# Patient Record
Sex: Male | Born: 1940
Health system: Southern US, Community
[De-identification: ages and names within clinical notes are randomized; demographics above are authoritative.]

## PROBLEM LIST (undated history)

## (undated) DIAGNOSIS — D751 Secondary polycythemia: Secondary | ICD-10-CM

## (undated) DIAGNOSIS — T5891XA Toxic effect of carbon monoxide from unspecified source, accidental (unintentional), initial encounter: Secondary | ICD-10-CM

## (undated) DIAGNOSIS — D696 Thrombocytopenia, unspecified: Secondary | ICD-10-CM

## (undated) DIAGNOSIS — I10 Essential (primary) hypertension: Secondary | ICD-10-CM

## (undated) DIAGNOSIS — J449 Chronic obstructive pulmonary disease, unspecified: Secondary | ICD-10-CM

## (undated) HISTORY — PX: COLONOSCOPY: SHX174

## (undated) HISTORY — DX: Toxic effect of carbon monoxide from unspecified source, accidental (unintentional), initial encounter: T58.91XA

## (undated) HISTORY — DX: Secondary polycythemia: D75.1

## (undated) HISTORY — PX: EYE SURGERY: SHX253

## (undated) HISTORY — PX: HERNIA REPAIR: SHX51

## (undated) HISTORY — DX: Essential (primary) hypertension: I10

## (undated) HISTORY — PX: OTHER SURGICAL HISTORY: SHX169

## (undated) HISTORY — DX: Thrombocytopenia, unspecified: D69.6

## (undated) HISTORY — PX: APPENDECTOMY: SHX54

## (undated) HISTORY — PX: ESOPHAGOGASTRODUODENOSCOPY: SHX1529

---

## 2009-07-27 ENCOUNTER — Ambulatory Visit: Payer: Self-pay | Admitting: Internal Medicine

## 2009-10-04 ENCOUNTER — Ambulatory Visit: Payer: Self-pay | Admitting: Vascular Surgery

## 2010-06-09 ENCOUNTER — Ambulatory Visit: Payer: Self-pay | Admitting: Otolaryngology

## 2012-04-08 ENCOUNTER — Ambulatory Visit: Payer: Self-pay | Admitting: Family Medicine

## 2012-08-01 ENCOUNTER — Ambulatory Visit: Payer: Self-pay | Admitting: Family Medicine

## 2012-09-26 ENCOUNTER — Ambulatory Visit: Payer: Self-pay | Admitting: Internal Medicine

## 2012-09-26 LAB — CBC CANCER CENTER
Basophil %: 0.8 %
Eosinophil #: 0.1 x10 3/mm (ref 0.0–0.7)
HCT: 65.7 % — ABNORMAL HIGH (ref 40.0–52.0)
HGB: 22.6 g/dL — ABNORMAL HIGH (ref 13.0–18.0)
MCH: 35.8 pg — ABNORMAL HIGH (ref 26.0–34.0)
MCV: 104 fL — ABNORMAL HIGH (ref 80–100)
Monocyte %: 10.1 %
Neutrophil #: 4.7 x10 3/mm (ref 1.4–6.5)
Platelet: 61 x10 3/mm — ABNORMAL LOW (ref 150–440)
RBC: 6.32 10*6/uL — ABNORMAL HIGH (ref 4.40–5.90)

## 2012-09-26 LAB — FERRITIN: Ferritin (ARMC): 111 ng/mL (ref 8–388)

## 2012-09-26 LAB — IRON AND TIBC: Iron Bind.Cap.(Total): 313 ug/dL (ref 250–450)

## 2012-09-30 LAB — CBC CANCER CENTER
Basophil #: 0.1 x10 3/mm (ref 0.0–0.1)
Basophil %: 1 %
Eosinophil #: 0.1 x10 3/mm (ref 0.0–0.7)
Eosinophil %: 0.8 %
HCT: 57.1 % — ABNORMAL HIGH (ref 40.0–52.0)
HGB: 19.6 g/dL — ABNORMAL HIGH (ref 13.0–18.0)
Lymphocyte #: 1.9 x10 3/mm (ref 1.0–3.6)
Lymphocyte %: 24.7 %
MCV: 104 fL — ABNORMAL HIGH (ref 80–100)
RBC: 5.49 10*6/uL (ref 4.40–5.90)
WBC: 7.8 x10 3/mm (ref 3.8–10.6)

## 2012-10-03 LAB — COMPREHENSIVE METABOLIC PANEL
Albumin: 3.5 g/dL (ref 3.4–5.0)
Alkaline Phosphatase: 75 U/L (ref 50–136)
BUN: 12 mg/dL (ref 7–18)
Bilirubin,Total: 1.2 mg/dL — ABNORMAL HIGH (ref 0.2–1.0)
Chloride: 102 mmol/L (ref 98–107)
Co2: 34 mmol/L — ABNORMAL HIGH (ref 21–32)
Creatinine: 0.82 mg/dL (ref 0.60–1.30)
EGFR (African American): >60
SGPT (ALT): 24 U/L (ref 12–78)
Total Protein: 7 g/dL (ref 6.4–8.2)

## 2012-10-03 LAB — CANCER CENTER HEMATOCRIT: HCT: 56.2 % — ABNORMAL HIGH (ref 40.0–52.0)

## 2012-10-03 LAB — PLATELET COUNT: Platelet: 91 10*3/uL — ABNORMAL LOW (ref 150–440)

## 2012-10-07 LAB — CBC CANCER CENTER
Basophil #: 0 x10 3/mm (ref 0.0–0.1)
Basophil %: 0.5 %
Eosinophil #: 0.1 x10 3/mm (ref 0.0–0.7)
HCT: 56 % — ABNORMAL HIGH (ref 40.0–52.0)
HGB: 19.2 g/dL — ABNORMAL HIGH (ref 13.0–18.0)
Lymphocyte #: 1.7 x10 3/mm (ref 1.0–3.6)
Lymphocyte %: 23 %
MCH: 35.9 pg — ABNORMAL HIGH (ref 26.0–34.0)
MCV: 105 fL — ABNORMAL HIGH (ref 80–100)
Platelet: 94 x10 3/mm — ABNORMAL LOW (ref 150–440)
RBC: 5.35 10*6/uL (ref 4.40–5.90)
RDW: 14.4 % (ref 11.5–14.5)
WBC: 7.5 x10 3/mm (ref 3.8–10.6)

## 2012-10-08 ENCOUNTER — Ambulatory Visit: Payer: Self-pay | Admitting: Internal Medicine

## 2012-10-14 LAB — CBC CANCER CENTER
Basophil #: 0 x10 3/mm (ref 0.0–0.1)
Basophil %: 0.5 %
Eosinophil #: 0.1 x10 3/mm (ref 0.0–0.7)
HCT: 53.5 % — ABNORMAL HIGH (ref 40.0–52.0)
HGB: 18.4 g/dL — ABNORMAL HIGH (ref 13.0–18.0)
Lymphocyte #: 2.6 x10 3/mm (ref 1.0–3.6)
Lymphocyte %: 28.2 %
MCH: 36.1 pg — ABNORMAL HIGH (ref 26.0–34.0)
MCHC: 34.5 g/dL (ref 32.0–36.0)
Monocyte #: 0.8 x10 3/mm (ref 0.2–1.0)
Monocyte %: 9 %
RDW: 14.4 % (ref 11.5–14.5)
WBC: 9.2 x10 3/mm (ref 3.8–10.6)

## 2012-10-23 LAB — CBC CANCER CENTER
Basophil #: 0.1 x10 3/mm (ref 0.0–0.1)
Basophil %: 0.9 %
Eosinophil #: 0.1 x10 3/mm (ref 0.0–0.7)
Eosinophil %: 0.6 %
HCT: 54.1 % — ABNORMAL HIGH (ref 40.0–52.0)
HGB: 18.5 g/dL — ABNORMAL HIGH (ref 13.0–18.0)
MCV: 104 fL — ABNORMAL HIGH (ref 80–100)
Monocyte #: 0.8 x10 3/mm (ref 0.2–1.0)
Monocyte %: 9.5 %
Neutrophil #: 5.3 x10 3/mm (ref 1.4–6.5)
Neutrophil %: 63 %

## 2012-10-30 ENCOUNTER — Observation Stay: Payer: Self-pay | Admitting: Internal Medicine

## 2012-10-30 LAB — CANCER CTR PLATELET CT: Platelet: 91 x10 3/mm — ABNORMAL LOW (ref 150–440)

## 2012-10-31 LAB — CBC WITH DIFFERENTIAL/PLATELET
Basophil #: 0.1 10*3/uL (ref 0.0–0.1)
Basophil %: 1.1 %
HCT: 48.5 % (ref 40.0–52.0)
HGB: 16.2 g/dL (ref 13.0–18.0)
Lymphocyte #: 1.7 10*3/uL (ref 1.0–3.6)
Lymphocyte %: 23.3 %
MCH: 34.6 pg — ABNORMAL HIGH (ref 26.0–34.0)
MCHC: 33.4 g/dL (ref 32.0–36.0)
MCV: 104 fL — ABNORMAL HIGH (ref 80–100)
Monocyte %: 8.2 %
Neutrophil %: 66.3 %
RDW: 14 % (ref 11.5–14.5)
WBC: 7.2 10*3/uL (ref 3.8–10.6)

## 2012-11-06 LAB — CBC CANCER CENTER
Basophil #: 0 x10 3/mm (ref 0.0–0.1)
Basophil %: 0.4 %
Eosinophil #: 0.1 x10 3/mm (ref 0.0–0.7)
Eosinophil %: 0.6 %
HCT: 50.4 % (ref 40.0–52.0)
HGB: 17.3 g/dL (ref 13.0–18.0)
Lymphocyte #: 2.4 x10 3/mm (ref 1.0–3.6)
Lymphocyte %: 26.1 %
MCH: 35.9 pg — ABNORMAL HIGH (ref 26.0–34.0)
MCHC: 34.3 g/dL (ref 32.0–36.0)
MCV: 105 fL — ABNORMAL HIGH (ref 80–100)
Monocyte #: 0.8 x10 3/mm (ref 0.2–1.0)
Monocyte %: 9.3 %
Neutrophil #: 5.8 x10 3/mm (ref 1.4–6.5)
Neutrophil %: 63.6 %
Platelet: 99 x10 3/mm — ABNORMAL LOW (ref 150–440)
RBC: 4.82 10*6/uL (ref 4.40–5.90)
RDW: 13.8 % (ref 11.5–14.5)
WBC: 9.1 x10 3/mm (ref 3.8–10.6)

## 2012-11-08 ENCOUNTER — Ambulatory Visit: Payer: Self-pay | Admitting: Internal Medicine

## 2012-11-11 LAB — CANCER CENTER HEMATOCRIT: HCT: 48.9 % (ref 40.0–52.0)

## 2012-11-11 LAB — CANCER CTR PLATELET CT: Platelet: 108 x10 3/mm — ABNORMAL LOW (ref 150–440)

## 2012-11-25 LAB — CANCER CENTER HEMATOCRIT: HCT: 46.8 % (ref 40.0–52.0)

## 2012-11-28 LAB — CANCER CENTER HEMATOCRIT: HCT: 48.1 % (ref 40.0–52.0)

## 2012-12-09 ENCOUNTER — Ambulatory Visit: Payer: Self-pay | Admitting: Internal Medicine

## 2012-12-23 LAB — HEMATOCRIT: HCT: 52.9 % — ABNORMAL HIGH (ref 40.0–52.0)

## 2013-01-08 ENCOUNTER — Ambulatory Visit: Payer: Self-pay | Admitting: Internal Medicine

## 2013-02-03 LAB — CANCER CTR PLATELET CT: Platelet: 106 x10 3/mm — ABNORMAL LOW (ref 150–440)

## 2013-02-03 LAB — CANCER CENTER HEMATOCRIT: HCT: 49.7 % (ref 40.0–52.0)

## 2013-02-08 ENCOUNTER — Ambulatory Visit: Payer: Self-pay | Admitting: Internal Medicine

## 2013-03-10 ENCOUNTER — Ambulatory Visit: Payer: Self-pay | Admitting: Internal Medicine

## 2013-03-10 LAB — CANCER CENTER HEMATOCRIT: HCT: 50.5 % (ref 40.0–52.0)

## 2013-03-31 LAB — CBC CANCER CENTER
Basophil #: 0.1 x10 3/mm (ref 0.0–0.1)
Basophil %: 0.8 %
Eosinophil #: 0.1 x10 3/mm (ref 0.0–0.7)
Eosinophil %: 0.6 %
HCT: 50.3 % (ref 40.0–52.0)
Lymphocyte #: 1.8 x10 3/mm (ref 1.0–3.6)
MCH: 26.4 pg (ref 26.0–34.0)
MCHC: 30.8 g/dL — ABNORMAL LOW (ref 32.0–36.0)
MCV: 86 fL (ref 80–100)
Monocyte #: 0.9 x10 3/mm (ref 0.2–1.0)
Neutrophil #: 6 x10 3/mm (ref 1.4–6.5)
Neutrophil %: 67.9 %
Platelet: 102 x10 3/mm — ABNORMAL LOW (ref 150–440)
RBC: 5.88 10*6/uL (ref 4.40–5.90)
RDW: 18.3 % — ABNORMAL HIGH (ref 11.5–14.5)
WBC: 8.9 x10 3/mm (ref 3.8–10.6)

## 2013-04-10 ENCOUNTER — Ambulatory Visit: Payer: Self-pay | Admitting: Internal Medicine

## 2013-04-28 LAB — CBC CANCER CENTER
Basophil #: 0.1 x10 3/mm (ref 0.0–0.1)
Basophil %: 1.3 %
EOS PCT: 0.5 %
Eosinophil #: 0 x10 3/mm (ref 0.0–0.7)
HCT: 49.6 % (ref 40.0–52.0)
HGB: 15.1 g/dL (ref 13.0–18.0)
LYMPHS PCT: 19.5 %
Lymphocyte #: 1.5 x10 3/mm (ref 1.0–3.6)
MCH: 24.9 pg — AB (ref 26.0–34.0)
MCHC: 30.5 g/dL — AB (ref 32.0–36.0)
MCV: 82 fL (ref 80–100)
MONO ABS: 0.9 x10 3/mm (ref 0.2–1.0)
Monocyte %: 11.3 %
NEUTROS ABS: 5.1 x10 3/mm (ref 1.4–6.5)
Neutrophil %: 67.4 %
Platelet: 93 x10 3/mm — ABNORMAL LOW (ref 150–440)
RBC: 6.06 10*6/uL — ABNORMAL HIGH (ref 4.40–5.90)
RDW: 19.5 % — AB (ref 11.5–14.5)
WBC: 7.6 x10 3/mm (ref 3.8–10.6)

## 2013-05-11 ENCOUNTER — Ambulatory Visit: Payer: Self-pay | Admitting: Internal Medicine

## 2013-05-16 ENCOUNTER — Ambulatory Visit: Payer: Self-pay | Admitting: Internal Medicine

## 2013-05-19 LAB — CBC CANCER CENTER
BASOS PCT: 0.8 %
Basophil #: 0.1 x10 3/mm (ref 0.0–0.1)
EOS PCT: 0.9 %
Eosinophil #: 0.1 x10 3/mm (ref 0.0–0.7)
HCT: 50.6 % (ref 40.0–52.0)
HGB: 15.4 g/dL (ref 13.0–18.0)
LYMPHS ABS: 1.7 x10 3/mm (ref 1.0–3.6)
LYMPHS PCT: 21.5 %
MCH: 24.4 pg — AB (ref 26.0–34.0)
MCHC: 30.6 g/dL — ABNORMAL LOW (ref 32.0–36.0)
MCV: 80 fL (ref 80–100)
MONO ABS: 1.2 x10 3/mm — AB (ref 0.2–1.0)
MONOS PCT: 15.3 %
Neutrophil #: 4.9 x10 3/mm (ref 1.4–6.5)
Neutrophil %: 61.5 %
Platelet: 79 x10 3/mm — ABNORMAL LOW (ref 150–440)
RBC: 6.33 10*6/uL — ABNORMAL HIGH (ref 4.40–5.90)
RDW: 20.1 % — AB (ref 11.5–14.5)
WBC: 7.9 x10 3/mm (ref 3.8–10.6)

## 2013-06-02 LAB — CANCER CTR PLATELET CT: Platelet: 101 x10 3/mm — ABNORMAL LOW (ref 150–440)

## 2013-06-02 LAB — CANCER CENTER HEMATOCRIT: HCT: 49.7 % (ref 40.0–52.0)

## 2013-06-08 ENCOUNTER — Ambulatory Visit: Payer: Self-pay | Admitting: Internal Medicine

## 2013-06-16 LAB — CBC CANCER CENTER
Basophil #: 0.1 x10 3/mm (ref 0.0–0.1)
Basophil %: 1.3 %
Eosinophil #: 0.1 x10 3/mm (ref 0.0–0.7)
Eosinophil %: 0.7 %
HCT: 48.9 % (ref 40.0–52.0)
HGB: 14.6 g/dL (ref 13.0–18.0)
Lymphocyte #: 1.8 x10 3/mm (ref 1.0–3.6)
Lymphocyte %: 22.3 %
MCH: 23.5 pg — ABNORMAL LOW (ref 26.0–34.0)
MCHC: 29.9 g/dL — ABNORMAL LOW (ref 32.0–36.0)
MCV: 79 fL — ABNORMAL LOW (ref 80–100)
MONOS PCT: 12.2 %
Monocyte #: 1 x10 3/mm (ref 0.2–1.0)
NEUTROS ABS: 5.1 x10 3/mm (ref 1.4–6.5)
NEUTROS PCT: 63.5 %
Platelet: 78 x10 3/mm — ABNORMAL LOW (ref 150–440)
RBC: 6.22 10*6/uL — ABNORMAL HIGH (ref 4.40–5.90)
RDW: 20.7 % — ABNORMAL HIGH (ref 11.5–14.5)
WBC: 8 x10 3/mm (ref 3.8–10.6)

## 2013-07-09 ENCOUNTER — Ambulatory Visit: Payer: Self-pay | Admitting: Internal Medicine

## 2013-07-14 LAB — CBC CANCER CENTER
BASOS PCT: 0.7 %
Basophil #: 0.1 x10 3/mm (ref 0.0–0.1)
EOS ABS: 0.1 x10 3/mm (ref 0.0–0.7)
Eosinophil %: 0.7 %
HCT: 52.5 % — AB (ref 40.0–52.0)
HGB: 15.8 g/dL (ref 13.0–18.0)
LYMPHS PCT: 22.3 %
Lymphocyte #: 1.8 x10 3/mm (ref 1.0–3.6)
MCH: 23.7 pg — ABNORMAL LOW (ref 26.0–34.0)
MCHC: 30.1 g/dL — ABNORMAL LOW (ref 32.0–36.0)
MCV: 79 fL — ABNORMAL LOW (ref 80–100)
MONOS PCT: 11.9 %
Monocyte #: 1 x10 3/mm (ref 0.2–1.0)
Neutrophil #: 5.2 x10 3/mm (ref 1.4–6.5)
Neutrophil %: 64.4 %
RBC: 6.64 10*6/uL — AB (ref 4.40–5.90)
RDW: 21.9 % — AB (ref 11.5–14.5)
WBC: 8.1 x10 3/mm (ref 3.8–10.6)

## 2013-07-27 ENCOUNTER — Observation Stay: Payer: Self-pay | Admitting: Internal Medicine

## 2013-07-27 LAB — COMPREHENSIVE METABOLIC PANEL
Albumin: 3.7 g/dL (ref 3.4–5.0)
Alkaline Phosphatase: 76 U/L
Anion Gap: 3 — ABNORMAL LOW (ref 7–16)
BILIRUBIN TOTAL: 0.9 mg/dL (ref 0.2–1.0)
BUN: 11 mg/dL (ref 7–18)
Calcium, Total: 8.9 mg/dL (ref 8.5–10.1)
Chloride: 101 mmol/L (ref 98–107)
Co2: 31 mmol/L (ref 21–32)
Creatinine: 0.75 mg/dL (ref 0.60–1.30)
Glucose: 93 mg/dL (ref 65–99)
Osmolality: 269 (ref 275–301)
Potassium: 4.2 mmol/L (ref 3.5–5.1)
SGOT(AST): 37 U/L (ref 15–37)
SGPT (ALT): 18 U/L (ref 12–78)
SODIUM: 135 mmol/L — AB (ref 136–145)
TOTAL PROTEIN: 8.2 g/dL (ref 6.4–8.2)

## 2013-07-27 LAB — URINALYSIS, COMPLETE
Bacteria: NONE SEEN
Bilirubin,UR: NEGATIVE
Blood: NEGATIVE
Glucose,UR: NEGATIVE mg/dL (ref 0–75)
Ketone: NEGATIVE
LEUKOCYTE ESTERASE: NEGATIVE
NITRITE: NEGATIVE
Ph: 5 (ref 4.5–8.0)
Protein: NEGATIVE
RBC,UR: NONE SEEN /HPF (ref 0–5)
SPECIFIC GRAVITY: 1.011 (ref 1.003–1.030)
Squamous Epithelial: 1
WBC UR: 1 /HPF (ref 0–5)

## 2013-07-27 LAB — CBC
HCT: 51.1 %
HGB: 15.1 g/dL
MCH: 23.6 pg — ABNORMAL LOW
MCHC: 29.5 g/dL — ABNORMAL LOW
MCV: 80 fL
Platelet: 85 x10 3/mm 3 — ABNORMAL LOW
RBC: 6.4 x10 6/mm 3 — ABNORMAL HIGH
RDW: 22.6 % — ABNORMAL HIGH
WBC: 7 x10 3/mm 3

## 2013-07-27 LAB — TROPONIN I: Troponin-I: <0.02 ng/mL

## 2013-07-27 LAB — PROTIME-INR
INR: 1
PROTHROMBIN TIME: 12.6 s (ref 11.5–14.7)

## 2013-07-28 LAB — CBC WITH DIFFERENTIAL/PLATELET
BASOS PCT: 0.9 %
BASOS PCT: 0.8 %
BASOS PCT: 0.7 %
Basophil #: 0.1 10*3/uL (ref 0.0–0.1)
Basophil #: 0.1 10*3/uL (ref 0.0–0.1)
Basophil #: 0.1 10*3/uL (ref 0.0–0.1)
Basophil #: 0 10*3/uL (ref 0.0–0.1)
Basophil %: 1.1 %
EOS ABS: 0 10*3/uL (ref 0.0–0.7)
EOS PCT: 0.5 %
EOS PCT: 0.7 %
Eosinophil #: 0.1 10*3/uL (ref 0.0–0.7)
Eosinophil #: 0 10*3/uL (ref 0.0–0.7)
Eosinophil #: 0.1 10*3/uL (ref 0.0–0.7)
Eosinophil %: 0.8 %
Eosinophil %: 1 %
HCT: 39.7 % — AB (ref 40.0–52.0)
HCT: 37.7 % — ABNORMAL LOW (ref 40.0–52.0)
HCT: 34.7 % — ABNORMAL LOW (ref 40.0–52.0)
HCT: 37.7 % — ABNORMAL LOW (ref 40.0–52.0)
HGB: 11.9 g/dL — ABNORMAL LOW (ref 13.0–18.0)
HGB: 11 g/dL — ABNORMAL LOW (ref 13.0–18.0)
HGB: 10.5 g/dL — AB (ref 13.0–18.0)
HGB: 11.2 g/dL — ABNORMAL LOW (ref 13.0–18.0)
LYMPHS PCT: 33.6 %
Lymphocyte #: 2.3 10*3/uL (ref 1.0–3.6)
Lymphocyte #: 1.5 10*3/uL (ref 1.0–3.6)
Lymphocyte #: 2.1 10*3/uL (ref 1.0–3.6)
Lymphocyte #: 2 10*3/uL (ref 1.0–3.6)
Lymphocyte %: 33.3 %
Lymphocyte %: 23.1 %
Lymphocyte %: 35.1 %
MCH: 23.8 pg — AB (ref 26.0–34.0)
MCH: 23.4 pg — AB (ref 26.0–34.0)
MCH: 24.1 pg — ABNORMAL LOW (ref 26.0–34.0)
MCH: 23.8 pg — ABNORMAL LOW (ref 26.0–34.0)
MCHC: 29.9 g/dL — ABNORMAL LOW (ref 32.0–36.0)
MCHC: 29.2 g/dL — ABNORMAL LOW (ref 32.0–36.0)
MCHC: 30.1 g/dL — AB (ref 32.0–36.0)
MCHC: 29.8 g/dL — ABNORMAL LOW (ref 32.0–36.0)
MCV: 80 fL (ref 80–100)
MCV: 80 fL (ref 80–100)
MCV: 80 fL (ref 80–100)
MCV: 80 fL (ref 80–100)
MONO ABS: 0.8 x10 3/mm (ref 0.2–1.0)
MONOS PCT: 11.2 %
MONOS PCT: 12.8 %
Monocyte #: 0.8 x10 3/mm (ref 0.2–1.0)
Monocyte #: 0.7 x10 3/mm (ref 0.2–1.0)
Monocyte #: 0.8 x10 3/mm (ref 0.2–1.0)
Monocyte %: 10.7 %
Monocyte %: 12.7 %
NEUTROS ABS: 3.1 10*3/uL (ref 1.4–6.5)
NEUTROS ABS: 3.1 10*3/uL (ref 1.4–6.5)
NEUTROS PCT: 64.6 %
NEUTROS PCT: 50.6 %
Neutrophil #: 3.7 10*3/uL (ref 1.4–6.5)
Neutrophil #: 4.2 10*3/uL (ref 1.4–6.5)
Neutrophil %: 53.8 %
Neutrophil %: 52 %
PLATELETS: 71 10*3/uL — AB (ref 150–440)
PLATELETS: 83 10*3/uL — AB (ref 150–440)
Platelet: 79 10*3/uL — ABNORMAL LOW (ref 150–440)
Platelet: 80 10*3/uL — ABNORMAL LOW (ref 150–440)
RBC: 4.98 10*6/uL (ref 4.40–5.90)
RBC: 4.71 10*6/uL (ref 4.40–5.90)
RBC: 4.34 10*6/uL — ABNORMAL LOW (ref 4.40–5.90)
RBC: 4.72 10*6/uL (ref 4.40–5.90)
RDW: 22.3 % — ABNORMAL HIGH (ref 11.5–14.5)
RDW: 22 % — ABNORMAL HIGH (ref 11.5–14.5)
RDW: 22.1 % — AB (ref 11.5–14.5)
RDW: 21.7 % — ABNORMAL HIGH (ref 11.5–14.5)
WBC: 6.8 10*3/uL (ref 3.8–10.6)
WBC: 6.4 10*3/uL (ref 3.8–10.6)
WBC: 6.1 10*3/uL (ref 3.8–10.6)
WBC: 6 10*3/uL (ref 3.8–10.6)

## 2013-07-28 LAB — BASIC METABOLIC PANEL
Anion Gap: 5 — ABNORMAL LOW (ref 7–16)
BUN: 11 mg/dL (ref 7–18)
Calcium, Total: 8.1 mg/dL — ABNORMAL LOW (ref 8.5–10.1)
Chloride: 104 mmol/L (ref 98–107)
Co2: 29 mmol/L (ref 21–32)
Creatinine: 0.66 mg/dL (ref 0.60–1.30)
EGFR (Non-African Amer.): >60
GLUCOSE: 97 mg/dL (ref 65–99)
Osmolality: 275 (ref 275–301)
Potassium: 3.8 mmol/L (ref 3.5–5.1)
SODIUM: 138 mmol/L (ref 136–145)

## 2013-08-01 LAB — PATHOLOGY REPORT

## 2013-08-08 ENCOUNTER — Ambulatory Visit: Payer: Self-pay | Admitting: Internal Medicine

## 2013-08-11 LAB — CBC CANCER CENTER
BASOS PCT: 0.9 %
Basophil #: 0.1 x10 3/mm (ref 0.0–0.1)
Eosinophil #: 0.1 x10 3/mm (ref 0.0–0.7)
Eosinophil %: 1.1 %
HCT: 35.7 % — ABNORMAL LOW (ref 40.0–52.0)
HGB: 11.1 g/dL — AB (ref 13.0–18.0)
Lymphocyte #: 2.6 x10 3/mm (ref 1.0–3.6)
Lymphocyte %: 27.3 %
MCH: 24.6 pg — ABNORMAL LOW (ref 26.0–34.0)
MCHC: 31 g/dL — ABNORMAL LOW (ref 32.0–36.0)
MCV: 79 fL — AB (ref 80–100)
MONOS PCT: 11.3 %
Monocyte #: 1.1 x10 3/mm — ABNORMAL HIGH (ref 0.2–1.0)
NEUTROS ABS: 5.8 x10 3/mm (ref 1.4–6.5)
NEUTROS PCT: 59.4 %
Platelet: 139 x10 3/mm — ABNORMAL LOW (ref 150–440)
RBC: 4.51 10*6/uL (ref 4.40–5.90)
RDW: 21.7 % — AB (ref 11.5–14.5)
WBC: 9.7 x10 3/mm (ref 3.8–10.6)

## 2013-09-04 LAB — CBC CANCER CENTER
Basophil #: 0.1 x10 3/mm (ref 0.0–0.1)
Basophil %: 1.1 %
EOS ABS: 0.1 x10 3/mm (ref 0.0–0.7)
EOS PCT: 1 %
HCT: 38.7 % — ABNORMAL LOW (ref 40.0–52.0)
HGB: 11.9 g/dL — AB (ref 13.0–18.0)
LYMPHS ABS: 2 x10 3/mm (ref 1.0–3.6)
LYMPHS PCT: 22.1 %
MCH: 24.8 pg — AB (ref 26.0–34.0)
MCHC: 30.9 g/dL — ABNORMAL LOW (ref 32.0–36.0)
MCV: 80 fL (ref 80–100)
MONO ABS: 1 x10 3/mm (ref 0.2–1.0)
Monocyte %: 10.4 %
NEUTROS ABS: 6 x10 3/mm (ref 1.4–6.5)
NEUTROS PCT: 65.4 %
PLATELETS: 102 x10 3/mm — AB (ref 150–440)
RBC: 4.82 10*6/uL (ref 4.40–5.90)
RDW: 20.3 % — ABNORMAL HIGH (ref 11.5–14.5)
WBC: 9.2 x10 3/mm (ref 3.8–10.6)

## 2013-09-08 ENCOUNTER — Ambulatory Visit: Payer: Self-pay | Admitting: Internal Medicine

## 2013-10-13 ENCOUNTER — Ambulatory Visit: Payer: Self-pay | Admitting: Internal Medicine

## 2013-10-13 LAB — CBC CANCER CENTER
Basophil #: 0.1 x10 3/mm (ref 0.0–0.1)
Basophil %: 0.8 %
Eosinophil #: 0.1 x10 3/mm (ref 0.0–0.7)
Eosinophil %: 0.9 %
HCT: 43.3 % (ref 40.0–52.0)
HGB: 13.2 g/dL (ref 13.0–18.0)
Lymphocyte #: 1.5 x10 3/mm (ref 1.0–3.6)
Lymphocyte %: 17.7 %
MCH: 24.7 pg — ABNORMAL LOW (ref 26.0–34.0)
MCHC: 30.5 g/dL — ABNORMAL LOW (ref 32.0–36.0)
MCV: 81 fL (ref 80–100)
Monocyte #: 0.9 x10 3/mm (ref 0.2–1.0)
Monocyte %: 10.3 %
NEUTROS ABS: 6 x10 3/mm (ref 1.4–6.5)
Neutrophil %: 70.3 %
Platelet: 102 x10 3/mm — ABNORMAL LOW (ref 150–440)
RBC: 5.35 10*6/uL (ref 4.40–5.90)
RDW: 19.9 % — ABNORMAL HIGH (ref 11.5–14.5)
WBC: 8.6 x10 3/mm (ref 3.8–10.6)

## 2013-10-24 ENCOUNTER — Ambulatory Visit: Payer: Self-pay | Admitting: Family Medicine

## 2013-11-08 ENCOUNTER — Ambulatory Visit: Payer: Self-pay | Admitting: Internal Medicine

## 2013-11-10 LAB — CBC CANCER CENTER
BASOS PCT: 1 %
Basophil #: 0.1 x10 3/mm (ref 0.0–0.1)
EOS ABS: 0.1 x10 3/mm (ref 0.0–0.7)
Eosinophil %: 0.8 %
HCT: 44.8 % (ref 40.0–52.0)
HGB: 13.8 g/dL (ref 13.0–18.0)
Lymphocyte #: 1.6 x10 3/mm (ref 1.0–3.6)
Lymphocyte %: 23.6 %
MCH: 24.9 pg — ABNORMAL LOW (ref 26.0–34.0)
MCHC: 30.9 g/dL — ABNORMAL LOW (ref 32.0–36.0)
MCV: 81 fL (ref 80–100)
Monocyte #: 1 x10 3/mm (ref 0.2–1.0)
Monocyte %: 14.6 %
NEUTROS PCT: 60 %
Neutrophil #: 4.1 x10 3/mm (ref 1.4–6.5)
Platelet: 87 x10 3/mm — ABNORMAL LOW (ref 150–440)
RBC: 5.54 10*6/uL (ref 4.40–5.90)
RDW: 20.1 % — ABNORMAL HIGH (ref 11.5–14.5)
WBC: 6.9 x10 3/mm (ref 3.8–10.6)

## 2013-12-08 LAB — CBC CANCER CENTER
BASOS PCT: 0.9 %
Basophil #: 0.1 x10 3/mm (ref 0.0–0.1)
EOS ABS: 0 x10 3/mm (ref 0.0–0.7)
Eosinophil %: 0.6 %
HCT: 48.5 % (ref 40.0–52.0)
HGB: 14.9 g/dL (ref 13.0–18.0)
Lymphocyte #: 1.9 x10 3/mm (ref 1.0–3.6)
Lymphocyte %: 23.3 %
MCH: 26.4 pg (ref 26.0–34.0)
MCHC: 30.8 g/dL — ABNORMAL LOW (ref 32.0–36.0)
MCV: 86 fL (ref 80–100)
MONO ABS: 1 x10 3/mm (ref 0.2–1.0)
Monocyte %: 11.9 %
Neutrophil #: 5.2 x10 3/mm (ref 1.4–6.5)
Neutrophil %: 63.3 %
Platelet: 83 x10 3/mm — ABNORMAL LOW (ref 150–440)
RBC: 5.66 10*6/uL (ref 4.40–5.90)
RDW: 22.5 % — ABNORMAL HIGH (ref 11.5–14.5)
WBC: 8.2 x10 3/mm (ref 3.8–10.6)

## 2013-12-09 ENCOUNTER — Ambulatory Visit: Payer: Self-pay | Admitting: Internal Medicine

## 2014-01-05 LAB — CBC CANCER CENTER
BASOS ABS: 0.1 x10 3/mm (ref 0.0–0.1)
Basophil %: 0.6 %
EOS ABS: 0.1 x10 3/mm (ref 0.0–0.7)
EOS PCT: 0.7 %
HCT: 50.6 % (ref 40.0–52.0)
HGB: 16.2 g/dL (ref 13.0–18.0)
Lymphocyte #: 2.1 x10 3/mm (ref 1.0–3.6)
Lymphocyte %: 20.4 %
MCH: 28.7 pg (ref 26.0–34.0)
MCHC: 32 g/dL (ref 32.0–36.0)
MCV: 90 fL (ref 80–100)
MONO ABS: 0.9 x10 3/mm (ref 0.2–1.0)
MONOS PCT: 8.5 %
Neutrophil #: 7.1 x10 3/mm — ABNORMAL HIGH (ref 1.4–6.5)
Neutrophil %: 69.8 %
Platelet: 88 x10 3/mm — ABNORMAL LOW (ref 150–440)
RBC: 5.64 10*6/uL (ref 4.40–5.90)
RDW: 21.4 % — ABNORMAL HIGH (ref 11.5–14.5)
WBC: 10.2 x10 3/mm (ref 3.8–10.6)

## 2014-01-08 ENCOUNTER — Ambulatory Visit: Payer: Self-pay | Admitting: Internal Medicine

## 2014-02-02 LAB — CBC CANCER CENTER
BASOS ABS: 0.1 x10 3/mm (ref 0.0–0.1)
Basophil %: 0.7 %
Eosinophil #: 0.1 x10 3/mm (ref 0.0–0.7)
Eosinophil %: 0.9 %
HCT: 51.8 % (ref 40.0–52.0)
HGB: 16.3 g/dL (ref 13.0–18.0)
Lymphocyte #: 2 x10 3/mm (ref 1.0–3.6)
Lymphocyte %: 25.9 %
MCH: 29.4 pg (ref 26.0–34.0)
MCHC: 31.5 g/dL — ABNORMAL LOW (ref 32.0–36.0)
MCV: 93 fL (ref 80–100)
Monocyte #: 0.8 x10 3/mm (ref 0.2–1.0)
Monocyte %: 10.9 %
NEUTROS ABS: 4.7 x10 3/mm (ref 1.4–6.5)
Neutrophil %: 61.6 %
Platelet: 85 x10 3/mm — ABNORMAL LOW (ref 150–440)
RBC: 5.54 10*6/uL (ref 4.40–5.90)
RDW: 18.2 % — ABNORMAL HIGH (ref 11.5–14.5)
WBC: 7.6 x10 3/mm (ref 3.8–10.6)

## 2014-02-08 ENCOUNTER — Ambulatory Visit: Payer: Self-pay | Admitting: Internal Medicine

## 2014-03-02 LAB — CBC CANCER CENTER
Basophil #: 0.1 x10 3/mm (ref 0.0–0.1)
Basophil %: 0.8 %
Eosinophil #: 0.1 x10 3/mm (ref 0.0–0.7)
Eosinophil %: 0.7 %
HCT: 51.9 % (ref 40.0–52.0)
HGB: 16.3 g/dL (ref 13.0–18.0)
LYMPHS ABS: 2.2 x10 3/mm (ref 1.0–3.6)
Lymphocyte %: 21.2 %
MCH: 30.3 pg (ref 26.0–34.0)
MCHC: 31.5 g/dL — AB (ref 32.0–36.0)
MCV: 96 fL (ref 80–100)
MONO ABS: 0.9 x10 3/mm (ref 0.2–1.0)
MONOS PCT: 9.3 %
NEUTROS ABS: 6.9 x10 3/mm — AB (ref 1.4–6.5)
Neutrophil %: 68 %
Platelet: 84 x10 3/mm — ABNORMAL LOW (ref 150–440)
RBC: 5.39 10*6/uL (ref 4.40–5.90)
RDW: 16.6 % — ABNORMAL HIGH (ref 11.5–14.5)
WBC: 10.2 x10 3/mm (ref 3.8–10.6)

## 2014-03-10 ENCOUNTER — Ambulatory Visit: Payer: Self-pay | Admitting: Internal Medicine

## 2014-03-16 LAB — CANCER CENTER HEMATOCRIT: HCT: 50.7 % (ref 40.0–52.0)

## 2014-03-31 LAB — CBC CANCER CENTER
Basophil #: 0.3 x10 3/mm — ABNORMAL HIGH (ref 0.0–0.1)
Basophil %: 2.8 %
EOS PCT: 0.9 %
Eosinophil #: 0.1 x10 3/mm (ref 0.0–0.7)
HCT: 51.4 % (ref 40.0–52.0)
HGB: 16.7 g/dL (ref 13.0–18.0)
Lymphocyte #: 2.1 x10 3/mm (ref 1.0–3.6)
Lymphocyte %: 23.5 %
MCH: 31.4 pg (ref 26.0–34.0)
MCHC: 32.5 g/dL (ref 32.0–36.0)
MCV: 97 fL (ref 80–100)
Monocyte #: 0.9 x10 3/mm (ref 0.2–1.0)
Monocyte %: 9.9 %
NEUTROS PCT: 62.9 %
Neutrophil #: 5.8 x10 3/mm (ref 1.4–6.5)
Platelet: 85 x10 3/mm — ABNORMAL LOW (ref 150–440)
RBC: 5.32 10*6/uL (ref 4.40–5.90)
RDW: 16.6 % — ABNORMAL HIGH (ref 11.5–14.5)
WBC: 9.1 x10 3/mm (ref 3.8–10.6)

## 2014-04-10 ENCOUNTER — Ambulatory Visit: Payer: Self-pay | Admitting: Internal Medicine

## 2014-04-20 LAB — CANCER CENTER HEMATOCRIT: HCT: 49.4 % (ref 40.0–52.0)

## 2014-05-11 ENCOUNTER — Ambulatory Visit: Payer: Self-pay | Admitting: Internal Medicine

## 2014-05-15 LAB — CANCER CENTER HEMATOCRIT: HCT: 51.2 % (ref 40.0–52.0)

## 2014-06-09 ENCOUNTER — Ambulatory Visit
Admit: 2014-06-09 | Disposition: A | Discharge status: Home / Self Care | Payer: Self-pay | Attending: Internal Medicine | Admitting: Internal Medicine

## 2014-07-10 ENCOUNTER — Ambulatory Visit
Admit: 2014-07-10 | Disposition: A | Discharge status: Home / Self Care | Payer: Self-pay | Attending: Internal Medicine | Admitting: Internal Medicine

## 2014-07-29 LAB — CREATININE, SERUM: Creatine, Serum: 0.66

## 2014-07-31 NOTE — H&P (Signed)
PATIENT NAME:  Patrick Ayala, Patrick Ayala MR#:  [id number] DATE OF BIRTH:  [birth year]  DATE OF ADMISSION:  10/30/2012  HISTORY OF PRESENT ILLNESS:   Mr. Tegtmeyer is a 74 year old patient who was seen and evaluated, and after exam in the [facility] he was hospitalized with carbon monoxide poisoning. Full notes from the [facility] were rendered reviewing his evaluation. He was also seen and his clinical status checked up later on the floor. He remained stable. His oxygen saturation was good, and then his repeat carboxyhemoglobin level was sent, which was low down to 16%. This full narrative of that evaluation was delayed until currently. The patient with a history of polycythemia secondary to COPD and carboxyhemoglobin and smoking, had a carboxyhemoglobin level higher than even for smokers; it had been 16 and then 23% as an outpatient. He was advised to check the exhaust on his car. He was told it was faulty, had that repaired. He was advised to have his home checked, which he initially said he had done, but he had never done. He was taking now measures to have his home checked for carbon monoxide, but he has no active gas furnace in the home or any of the risk factors for environmental exposure that can be identified. He does smoke 1 pack of cigarettes a day. He has been counseled about quitting. He was not short of breath. No headache. No dizziness. No confusion. No chest pain. He was in his baseline health, phlebotomy was performed for a hematocrit of 53, as previously planned, to target him down to 48 to 50, but with the most recent carboxyhemoglobin level extremely high. After discussion with Emergency Room physicians, he was hospitalized. We put him on oxygen and repeated his values. He also has a mild stable thrombocytopenia without any bleeding, and plans are to repeat the full CBC the next day, July 24. The patient has a history of pulmonary fibrosis and hypoxia. He has had a workup for thrombocytopenia  including normal HIV and hepatitis C, and he has no splenomegaly, and he has denied an alcohol history. In addition, he has had hypertension and hypercholesterolemia.   SOCIAL HISTORY: Negative alcohol. Positive for tobacco, as noted.   HOME MEDICATIONS:  He has been on aspirin 81 mg daily, amlodipine 5 mg daily, Symbicort 2 puffs twice daily and atorvastatin 20 mg once a day at bedtime.   REVIEW OF SYSTEMS: System review was really negative. He did not have any headache, dizziness, visual disturbance, chills, sweats, stomatitis, cough, ear or jaw discomfort, visual disturbances, chest pain or palpitations. His respiratory status is steady. He has had some shortness of breath on exertion. None on mild exertion. None walking around the clinic. He has oxygen that he uses at home rarely, p.r.n. and at night. He has no abdominal pain, nausea, vomiting, diarrhea. His appetite has been steady. No edema. No back or bone pain.   PHYSICAL EXAMINATION: GENERAL: He is alert and cooperative in no acute distress. No jaundice.  HEENT: No thrush.  LYMPHATICS: No palpable lymph nodes  neck, supraclavicular submandibular> axilla.  LUNGS: Decreased air entry throughout all lung fields, which is an old finding. No dullness. He was not wheezing.  ABDOMEN: Nontender. No palpable mass or organomegaly.  EXTREMITIES: No edema. No swollen or deformed joints. No rash or bruising.   LABORATORY DATA: His hematocrit was 53.2 prior to phlebotomy. His platelets were 91, both are baseline results. His prior outpatient carboxyhemoglobin went up to 23. His repeat carboxyhemoglobin  by cooximetry from venous blood in the hospital later returned at 15.3%. He prior, back on 06/26 had full chemistries that included normal creatinine. Bilirubin was borderline elevated at 1.2. Other liver function tests were normal.   IMPRESSION AND PLAN: Carbon monoxide environmental exposure and also from cigarettes. He is taking measures to have his  home and his automobile checked. His wife, who lives in the home, has also been informed, and she has worked with her primary care physician as well to be evaluated. Would plan to repeat his CBC and his parameters in the morning, July 24. If they are clearly on their way down, he can be released. He will continue to follow up in the clinic for polycythemia and thrombocytopenia and prior other medical issues, as well as plans to repeat some abdominal imaging.      ____________________________ Simonne Come. Inez Pilgrim, MD rgg:dmm D: 10/31/2012 09:29:21 ET T: 10/31/2012 09:58:57 ET JOB#: [id number]  cc: Simonne Come. Inez Pilgrim, MD, <Dictator> Dallas Schimke MD ELECTRONICALLY SIGNED 11/12/2012 9:34

## 2014-07-31 NOTE — Consult Note (Signed)
PATIENT NAME:  Patrick Ayala, Patrick Ayala MR#:  [id number] DATE OF BIRTH:  11/03/1940  DATE OF CONSULTATION:  10/31/2012  CONSULTING PHYSICIAN:   Isaias Cowman, MD  REFERRING PHYSICIAN:  Dr. Inez Pilgrim   PRIMARY CARE PHYSICIAN:  Dr. Jeananne Rama  REASON FOR CONSULTATION:  Abnormal EKG.   HISTORY OF PRESENT ILLNESS: The patient is a 74 year old gentleman, referred for evaluation of abnormal EKG. The patient has a history of polycythemia secondary to COPD, with continued tobacco abuse. The patient was seen as an outpatient, and was found to have carbon monoxide poisoning, with a carboxyhemoglobin of 23%. Admission labs were notable for a hematocrit of 53 and a platelet count of 91,000. The patient underwent phlebotomy, with repeat hematocrit of 48.5. EKG on admission revealed sinus rhythm with T-wave inversions in leads V1 through V3. The patient denies chest pain. Troponin was less than 0.02. He has undergone previous cardiac catheterization April 2011, which revealed normal coronary anatomy.   PAST MEDICAL HISTORY: 1.  Polycythemia.  2.  COPD.   3.  Hypertension.  4.  Normal coronary anatomy by cardiac catheterization April 2011.   MEDICATIONS ON ADMISSION:  Aspirin 81 mg daily, amlodipine 5 mg daily, Symbicort 2 puffs b.i.d., atorvastatin 20 mg at bedtime.   SOCIAL HISTORY:  The patient is married, resides with his wife and son. He smokes a pack of cigarettes a day.   FAMILY HISTORY: No immediate family history of coronary artery disease or myocardial infarction.   REVIEW OF SYSTEMS: CONSTITUTIONAL:  No fever or chills.  EYES:  No blurry vision.  EARS:  No hearing loss.  RESPIRATORY:  No shortness of breath.  CARDIOVASCULAR:  No chest pain.  GASTROINTESTINAL:  No nausea, vomiting, diarrhea, constipation.  GENITOURINARY:  No dysuria or hematuria.  ENDOCRINE:  No polyuria or polydipsia.  MUSCULOSKELETAL: No arthralgias or myalgias.  NEUROLOGICAL:  No focal muscle weakness or numbness.   PSYCHOLOGICAL:  No depression or anxiety.   PHYSICAL EXAMINATION: VITAL SIGNS: Blood pressure 149/77, pulse 63, respirations 20, temperature 97.6, pulse ox 95%.  HEENT: Pupils equal and reactive to light and accommodation.  NECK:  Supple, without thyromegaly.  LUNGS:  Reveal decreased breath sounds bilaterally.  CARDIOVASCULAR:  Normal JVP. Normal PMI. Regular rate and rhythm. Normal S1, S2. No appreciable gallop, murmur, rub.  ABDOMEN:  Soft, nontender. Pulses were intact bilaterally.  MUSCULOSKELETAL: Normal muscle tone.  NEUROLOGIC:  The patient is alert and oriented x 3. Motor and sensory both grossly intact.   IMPRESSION:  A 74 year old gentleman with known normal coronary anatomy, who presents with carbon monoxide poisoning, with a carboxyhemoglobin of 23%. EKG is abnormal, with T- wave inversions in the absence of chest pain and negative troponin, with known normal coronary anatomy.   RECOMMENDATIONS: 1.  Agree with overall current therapy.  2. Would defer further cardiac evaluation, especially in light of known normal coronary anatomy, in the absence of chest pain.  3.  The patient may be discharged home and follow up as an outpatient.    ____________________________ Isaias Cowman, MD ap:mr D: 10/31/2012 17:53:00 ET T: 10/31/2012 19:21:30 ET JOB#: [id number]  cc: Isaias Cowman, MD, <Dictator> Isaias Cowman MD ELECTRONICALLY SIGNED 11/02/2012 10:10

## 2014-08-01 NOTE — Discharge Summary (Signed)
PATIENT NAME:  Patrick Ayala, Patrick Ayala MR#:  [id number] DATE OF BIRTH:  08/15/1940  DATE OF ADMISSION:  07/27/2013 DATE OF DISCHARGE:  07/29/2013  PRESENTING COMPLAINT: Rectal bleed.   DISCHARGE DIAGNOSES:  1. Lower gastrointestinal bleed suspected due to acute diverticular bleed, resolved.  2. Chronic obstructive pulmonary disease with ongoing tobacco abuse. The patient has oxygen at home.   PROCEDURES: Colonoscopy: A 3 to 6 mm polyps in the cecum resected, 4 to 7 mm polyps in the sigmoid colon resected. Diverticulosis in the sigmoid colon.   CONSULTATIONS: GI, Lucilla Lame, MD  MEDICATIONS:  1. Amlodipine 5 mg daily.  2. Symbicort 60/4.5 two puffs b.i.d.  3. Atorvastatin 20 mg p.o. daily.  4. Aspirin 81 mg p.o. daily. The patient advised to start taking it from 08/02/2013.   FOLLOWUP: With Dr. Jeananne Rama in 1 to 2 weeks.   LABORATORY DATA: Hemoglobin at discharge was 11.2.   BRIEF SUMMARY OF HOSPITAL COURSE: Patrick Ayala is a 74 year old African-American gentleman with history of ongoing tobacco abuse and COPD, who comes in with:   1. Hematochezia/lower gastrointestinal bleed, which was suspected diverticular versus hemorrhoidal. The patient was seen by Dr. Allen Norris, who performed a colonoscopy, results as above were noted. Lower gastrointestinal bleed was suspected due to diverticulosis. H and H remained stable.  2. Hypertension. Continued amlodipine.  3. Chronic obstructive pulmonary disease with ongoing tobacco abuse. The patient was advised on smoking cessation.  4. Hospital stay otherwise remained stable.   CODE STATUS: The patient remained a full code.   TIME SPENT: 40 minutes.    ____________________________ Hart Rochester Posey Pronto, MD sap:lb D: 07/30/2013 19:50:93 ET T: 07/30/2013 07:32:29 ET JOB#: [id number]  cc: Tatyana Biber A. Posey Pronto, MD, <Dictator> Guadalupe Maple, MD Lucilla Lame, MD Ilda Basset MD ELECTRONICALLY SIGNED 08/04/2013 9:13

## 2014-08-01 NOTE — Consult Note (Signed)
Brief Consult Note: Diagnosis: hematochezia.   Patient was seen by consultant.   Comments: Patrick Ayala is a very pleasant 74 y/o black male admitted with acute large volume painless hematochezia.  Differentials include diverticular bleeding, ischemic colitis, inflammatory bowel disease, colorectal polyp or carcinoma, or benign anorectal bleeding.  Discussed risks/benefits of procedure which include but are not limited to bleeding, infection, perforation & drug reaction.  Patient agrees with this plan & consent will be obtained.  Plan: 1) Clear liquids 2) NPO afrer midnight 3) standard prep 4) agree with BID protonix 4m 5) continue supportive measures 6) follow H/H  7) colonoscopy tomorrow with Dr WAllen NorrisThanks for allowing [place] participate in his care.  Please see full dictated note. ##[id number]  Electronic Signatures: JAndria Meuse(NP)  (Signed 20-Apr-15 13:22)  Authored: Brief Consult Note   Last Updated: 20-Apr-15 13:22 by JAndria Meuse(NP)

## 2014-08-01 NOTE — Consult Note (Signed)
PATIENT NAME:  Patrick Ayala, Patrick Ayala MR#:  [id number] DATE OF BIRTH:  1941-02-25  DATE OF CONSULTATION:  07/28/2013  REFERRING PHYSICIAN:  Monica Becton, MD CONSULTING PHYSICIAN:  Lucilla Lame, MD / Andria Meuse, NP  PRIMARY CARE PHYSICIAN: Golden Pop, MD  REASON FOR CONSULTATION: Hematochezia.   HISTORY OF PRESENT ILLNESS: Mr. [name] is a very pleasant 74 year old black male who was in his usual state of health with COPD/fibrosis, hypertension, and polycythemia vera, followed by Dr. Inez Pilgrim, when he developed a large amount of bright red blood yesterday afternoon around 1630. He had been in his chair sleeping upon the onset of his bleeding. He denies any abdominal pain associated with this. He denies any nausea, vomiting, heartburn or indigestion. He denies any constipation or diarrhea. His weight has been stable and his appetite has been fine. He denies any recent NSAID use. He does take an aspirin 81 mg daily. He has never had a colonoscopy. The bleeding was a large amount of fresh blood without any clots. He then noticed 4 more episodes of bleeding mixed within the stools. His hemoglobin was 15.1 upon arrival, down to 11.9 this morning.   PAST MEDICAL AND SURGICAL HISTORY: He has history of carbon monoxide poisoning, appendectomy, pulmonary fibrosis, COPD, hypertension, polycythemia vera followed by Dr. Inez Pilgrim, right inguinal hernia repair, and negative cardiac cath in 2011.   MEDICATIONS PRIOR TO ADMISSION: Aspirin 81 mg daily, Symbicort 2 puffs b.i.d., atorvastatin 20 mg daily, amlodipine 5 mg daily.   ALLERGIES: No known drug allergies.   FAMILY HISTORY: Positive for father with ulcerative colitis, otherwise noncontributory.   SOCIAL HISTORY: He continues to smoke 1-1/2 packs per day for the last 53 years and has no desire of quitting. He consumes 2 to 3 beers nightly. Denies any illicit drug use. 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   REVIEW OF SYSTEMS: See HPI,  otherwise, negative 12 point review of systems.  PHYSICAL EXAMINATION: VITAL SIGNS: Temperature 98.4, pulse 89, respirations 20, blood pressure 105/66, O2 sat on 100% on 2 liters via nasal cannula.  GENERAL: He is a well-developed, well-nourished black male in no acute distress.  HEENT: Sclerae clear, anicteric. Conjunctivae pink. Oropharynx pink and moist. He does have poor dentition.  NECK: Supple without mass or thyromegaly.  CHEST: Heart regular rate and rhythm. Normal S1 and S2 without murmurs, clicks, rubs, or gallops.  LUNGS: Mild expiratory wheezes bilaterally.  ABDOMEN: Positive bowel sounds x4. No bruits auscultated. He does have a small palpable density just adjacent to the right of his umbilicus with a small bulge. It is nontender without discrete mass or herniation. No rebound, tenderness or guarding. No hepatosplenomegaly or mass.  EXTREMITIES: Without edema. He does have clubbing.   LABORATORY STUDIES: LFTs and troponin negative. Met-7 was normal except for calcium 8.1. INR is 1.  IMPRESSION: [name] is a very pleasant 74 year old black male admitted with acute large volume painless hematochezia. Differentials include diverticular bleeding, ischemic colitis, inflammatory bowel disease, colorectal polyp or carcinoma, and benign anorectal bleeding. I have discussed risks and benefits which include but are not limited to bleeding, infection, perforation and drug reaction. The patient agrees with the plan and consent will be obtained. He has underlying history of polycythemia vera and his hemoglobin has dropped 3 grams since admission and is 11.9.   PLAN: 1.  Clear liquids today.  2.  N.p.o. after midnight.  3.  Standard prep.  4.  Agree with b.i.d. Protonix 40  mg.  5.  Continue supportive measures.  6.  Follow hemoglobin and hematocrit.  7.  Colonoscopy tomorrow with Dr. Allen Norris.  ____________________________ Andria Meuse, NP klj:sb D: 07/28/2013 13:21:53 ET T: 07/28/2013  13:38:44 ET JOB#: [id number]  cc: Andria Meuse, NP, <Dictator> Guadalupe Maple, MD Andria Meuse FNP ELECTRONICALLY SIGNED 07/29/2013 14:30

## 2014-08-01 NOTE — H&P (Signed)
PATIENT NAME:  Patrick Ayala, Patrick Ayala MR#:  [id number] DATE OF BIRTH:  January 11, 1941  DATE OF ADMISSION:  07/27/2013  REFERRING PHYSICIAN: Dr. Jimmye Norman.   CHIEF COMPLAINT: Bright red blood per rectum.   HISTORY OF PRESENT ILLNESS: Patrick Ayala is a 74 year old pleasant African American male with a past medical history of hypertension and polycythemia  presented to the Emergency Department with complaints of bright red blood per rectum started since this evening, 4:30 p.m. Had 4 large bowel movements. The patient's hemoglobin is stable at 15. Denies having any abdominal pain, nausea, vomiting.   PAST MEDICAL HISTORY:  1. Hypertension.  2. Hernia repair.  3. Polycythemia vera.   PAST SURGICAL HISTORY: Hernia repair.   ALLERGIES: No known drug allergies.   HOME MEDICATIONS:  1. Symbicort 2 puffs twice daily.  2. Atorvastatin 20 mg once a day.  3. Aspirin 81 mg daily.  4. Amlodipine 5 mg once a day.   SOCIAL HISTORY: Continues to smoke 1 to 1-1/2 packs a day. Drinks 2 to 3 beers every night. Denies using any illicit drugs. Married, lives with his wife.   FAMILY HISTORY: History of hypertension.   REVIEW OF SYSTEMS.  CONSTITUTIONAL: Denies any generalized weakness.  EYES: No change in vision.  ENT: No change in hearing. RESPIRATORY: Has cough, shortness of breath.  CARDIOVASCULAR: No chest pain, palpitations.  GASTROINTESTINAL: No nausea, vomiting, abdominal pain.  GENITOURINARY: No dysuria or hematuria.  SKIN: No rash or lesions.  MUSCULOSKELETAL: Good range of motion in all the extremities.   PHYSICAL EXAMINATION:  GENERAL: This is a well-built, well-nourished, age-appropriate male lying down in the bed, not in distress.  VITAL SIGNS: Temperature 98.2, pulse 88, blood pressure 154/73, respiratory rate of 16, oxygen saturation is 90% on room air.  HEENT: Head normocephalic, atraumatic. No scleral icterus. Conjunctivae normal. Pupils equal and react to light. Extraocular movements are intact.  Mucous membranes moist. No pharyngeal erythema.  NECK: Supple. No lymphadenopathy. No JVD. No carotid bruits.  CHEST: Has no focal tenderness.  LUNGS: Bilaterally clear to auscultation.  HEART: S1, S2 regular. No murmurs are heard.  ABDOMEN: Bowel sounds present. Soft, nontender, nondistended. Could not appreciate hepatosplenomegaly.  EXTREMITIES: No pedal edema. Pulses 2+.  NEUROLOGIC: The patient is alert, oriented to place, person, and time. Cranial nerves II-XII intact. Motor 5/5 in upper and lower extremities.  SKIN: No rash or lesions.  MUSCULOSKELETAL: Good remain range of motion in all the extremities.   LABORATORY DATA: CMP is completely within normal limits. CBC: WBC is 7, hemoglobin 15.1, platelet count of 85,000. Urinalysis negative for nitrites and leukocyte esterase.   ASSESSMENT AND PLAN: Patrick Ayala is a 74 year old male who comes with bright red blood per rectum.  1. Bright red blood per rectum. The patient never had colonoscopy in the past. Highly concerning of diverticular bleed. Admit the patient to a monitored bed. Continue to check CBC. Discontinue aspirin. Consult gastroenterology.  2. Hypertension: Currently well controlled. Continue the home medications. Continue with amlodipine. 3. Keep the patient on deep vein thrombosis prophylaxis with sequential compression devices.   TIME SPENT: 45 minutes.    ____________________________ Monica Becton, MD pv:lt D: 07/27/2013 22:45:02 ET T: 07/27/2013 23:10:18 ET JOB#: [id number]  cc: Monica Becton, MD, <Dictator> Guadalupe Maple, MD Monica Becton MD ELECTRONICALLY SIGNED 08/07/2013 20:55

## 2014-08-10 ENCOUNTER — Ambulatory Visit: Payer: Self-pay | Admitting: Internal Medicine

## 2014-08-10 ENCOUNTER — Other Ambulatory Visit: Payer: Self-pay

## 2014-08-11 ENCOUNTER — Other Ambulatory Visit: Payer: Self-pay | Admitting: Family Medicine

## 2014-08-11 DIAGNOSIS — D751 Secondary polycythemia: Secondary | ICD-10-CM

## 2014-08-12 ENCOUNTER — Inpatient Hospital Stay: Payer: Medicare PPO

## 2014-08-12 ENCOUNTER — Encounter: Payer: Self-pay | Admitting: Internal Medicine

## 2014-08-12 ENCOUNTER — Inpatient Hospital Stay: Payer: Medicare PPO | Attending: Internal Medicine

## 2014-08-12 DIAGNOSIS — D751 Secondary polycythemia: Secondary | ICD-10-CM

## 2014-08-12 DIAGNOSIS — D45 Polycythemia vera: Secondary | ICD-10-CM | POA: Insufficient documentation

## 2014-08-12 LAB — CBC
HCT: 50.7 % (ref 40.0–52.0)
Hemoglobin: 16.2 g/dL (ref 13.0–18.0)
MCH: 29.1 pg (ref 26.0–34.0)
MCHC: 31.9 g/dL — ABNORMAL LOW (ref 32.0–36.0)
MCV: 91.2 fL (ref 80.0–100.0)
Platelets: 89 10*3/uL — ABNORMAL LOW (ref 150–440)
RBC: 5.56 MIL/uL (ref 4.40–5.90)
RDW: 16.2 % — ABNORMAL HIGH (ref 11.5–14.5)
WBC: 9.4 10*3/uL (ref 3.8–10.6)

## 2014-08-18 NOTE — Progress Notes (Signed)
Erroneous encounter This encounter was created in error - please disregard.

## 2014-08-27 ENCOUNTER — Other Ambulatory Visit: Payer: Self-pay | Admitting: *Deleted

## 2014-08-27 DIAGNOSIS — D751 Secondary polycythemia: Secondary | ICD-10-CM

## 2014-08-27 DIAGNOSIS — D696 Thrombocytopenia, unspecified: Secondary | ICD-10-CM

## 2014-08-28 ENCOUNTER — Inpatient Hospital Stay: Payer: Medicare PPO

## 2014-08-28 ENCOUNTER — Inpatient Hospital Stay: Payer: Medicare PPO | Admitting: Internal Medicine

## 2014-08-28 DIAGNOSIS — D45 Polycythemia vera: Secondary | ICD-10-CM | POA: Diagnosis not present

## 2014-08-28 DIAGNOSIS — D751 Secondary polycythemia: Secondary | ICD-10-CM

## 2014-08-28 DIAGNOSIS — D696 Thrombocytopenia, unspecified: Secondary | ICD-10-CM

## 2014-08-28 LAB — CBC WITH DIFFERENTIAL/PLATELET
Basophils Absolute: 0.1 10*3/uL (ref 0–0.1)
Basophils Relative: 1 %
Eosinophils Absolute: 0.1 10*3/uL (ref 0–0.7)
Eosinophils Relative: 1 %
HCT: 50.5 % (ref 40.0–52.0)
Hemoglobin: 15.8 g/dL (ref 13.0–18.0)
LYMPHS ABS: 1.3 10*3/uL (ref 1.0–3.6)
MCH: 28.2 pg (ref 26.0–34.0)
MCHC: 31.4 g/dL — ABNORMAL LOW (ref 32.0–36.0)
MCV: 89.9 fL (ref 80.0–100.0)
MONO ABS: 0.8 10*3/uL (ref 0.2–1.0)
Monocytes Relative: 10 %
Neutro Abs: 5.7 10*3/uL (ref 1.4–6.5)
Neutrophils Relative %: 72 %
Platelets: 82 10*3/uL — ABNORMAL LOW (ref 150–440)
RBC: 5.62 MIL/uL (ref 4.40–5.90)
RDW: 16.3 % — AB (ref 11.5–14.5)
WBC: 7.9 10*3/uL (ref 3.8–10.6)

## 2014-09-11 ENCOUNTER — Other Ambulatory Visit: Payer: Medicare PPO

## 2014-09-25 ENCOUNTER — Inpatient Hospital Stay: Payer: Medicare PPO | Attending: Family Medicine | Admitting: Family Medicine

## 2014-09-25 ENCOUNTER — Inpatient Hospital Stay: Payer: Medicare PPO

## 2014-09-25 ENCOUNTER — Other Ambulatory Visit: Payer: Self-pay | Admitting: *Deleted

## 2014-09-25 ENCOUNTER — Other Ambulatory Visit: Payer: Self-pay | Admitting: Family Medicine

## 2014-09-25 DIAGNOSIS — D751 Secondary polycythemia: Secondary | ICD-10-CM

## 2014-09-25 DIAGNOSIS — D45 Polycythemia vera: Secondary | ICD-10-CM | POA: Insufficient documentation

## 2014-09-25 LAB — CBC WITH DIFFERENTIAL/PLATELET
Basophils Absolute: 0.1 10*3/uL (ref 0–0.1)
EOS ABS: 0.1 10*3/uL (ref 0–0.7)
HCT: 53.4 % — ABNORMAL HIGH (ref 40.0–52.0)
Hemoglobin: 16.7 g/dL (ref 13.0–18.0)
Lymphocytes Relative: 27 %
Lymphs Abs: 2.5 10*3/uL (ref 1.0–3.6)
MCH: 27.6 pg (ref 26.0–34.0)
MCHC: 31.3 g/dL — ABNORMAL LOW (ref 32.0–36.0)
MCV: 88.1 fL (ref 80.0–100.0)
Monocytes Absolute: 0.9 10*3/uL (ref 0.2–1.0)
Monocytes Relative: 10 %
Neutro Abs: 5.7 10*3/uL (ref 1.4–6.5)
PLATELETS: 79 10*3/uL — AB (ref 150–440)
RBC: 6.06 MIL/uL — ABNORMAL HIGH (ref 4.40–5.90)
RDW: 18 % — AB (ref 11.5–14.5)
WBC: 9.3 10*3/uL (ref 3.8–10.6)

## 2014-10-20 ENCOUNTER — Other Ambulatory Visit: Payer: Self-pay | Admitting: Family Medicine

## 2014-10-20 ENCOUNTER — Encounter: Payer: Self-pay | Admitting: Family Medicine

## 2014-10-20 DIAGNOSIS — D751 Secondary polycythemia: Secondary | ICD-10-CM

## 2014-10-20 HISTORY — DX: Secondary polycythemia: D75.1

## 2014-11-02 ENCOUNTER — Encounter: Payer: Self-pay | Admitting: Hematology and Oncology

## 2014-11-02 ENCOUNTER — Inpatient Hospital Stay: Payer: Medicare PPO

## 2014-11-02 ENCOUNTER — Other Ambulatory Visit: Payer: Self-pay | Admitting: Family Medicine

## 2014-11-02 ENCOUNTER — Inpatient Hospital Stay: Payer: Medicare PPO | Attending: Hematology and Oncology | Admitting: Hematology and Oncology

## 2014-11-02 VITALS — BP 142/74 | HR 97 | Temp 95.3°F | Ht 69.0 in | Wt 130.5 lb

## 2014-11-02 DIAGNOSIS — F17210 Nicotine dependence, cigarettes, uncomplicated: Secondary | ICD-10-CM

## 2014-11-02 DIAGNOSIS — Z79899 Other long term (current) drug therapy: Secondary | ICD-10-CM

## 2014-11-02 DIAGNOSIS — Z7982 Long term (current) use of aspirin: Secondary | ICD-10-CM

## 2014-11-02 DIAGNOSIS — Z809 Family history of malignant neoplasm, unspecified: Secondary | ICD-10-CM | POA: Diagnosis not present

## 2014-11-02 DIAGNOSIS — D696 Thrombocytopenia, unspecified: Secondary | ICD-10-CM

## 2014-11-02 DIAGNOSIS — D751 Secondary polycythemia: Secondary | ICD-10-CM | POA: Diagnosis present

## 2014-11-02 LAB — CBC WITH DIFFERENTIAL/PLATELET
BASOS ABS: 0 10*3/uL (ref 0–0.1)
Basophils Relative: 1 %
Eosinophils Absolute: 0.1 10*3/uL (ref 0–0.7)
HCT: 51.9 % (ref 40.0–52.0)
Hemoglobin: 16.4 g/dL (ref 13.0–18.0)
Lymphs Abs: 2 10*3/uL (ref 1.0–3.6)
MCH: 28 pg (ref 26.0–34.0)
MCHC: 31.7 g/dL — ABNORMAL LOW (ref 32.0–36.0)
MCV: 88.5 fL (ref 80.0–100.0)
MONO ABS: 0.9 10*3/uL (ref 0.2–1.0)
Neutro Abs: 4.1 10*3/uL (ref 1.4–6.5)
Platelets: 83 10*3/uL — ABNORMAL LOW (ref 150–440)
RBC: 5.87 MIL/uL (ref 4.40–5.90)
RDW: 19.1 % — ABNORMAL HIGH (ref 11.5–14.5)
WBC: 7.2 10*3/uL (ref 3.8–10.6)

## 2014-11-02 NOTE — Progress Notes (Signed)
[facility] day:  11/02/2014  Chief Complaint: Patrick Ayala is a 74 y.o. male with secondary polycythemia and thrombocytopenia for reassessment.  HPI: The patient was initially seen by Dr. Inez Pilgrim on 09/16/2012. He was noted to have erythrocytosis and thrombocytopenia. Specifically CBC included a hematocrit of 65.7, hemoglobin 22.6, MCV 104, platelets 61,000, Musial count 8400 with an ANC of 4700.    He underwent a workup. Carboxyhemoglobin was initially elevated at 16% then in later increased to 23% and at last check was 5.3%.  Elevated levels were attributed to smoking in a small unventilated room for many hours. Carbon  monoxide detectors were placed. He also had a truck exhaust leak which was fixed about a year ago. He still smokes 1 pack per day, but is trying to cut back.  Additional labs drawn by Dr. Inez Pilgrim included normal erythropoietin level, iron studies, JAK2, HIV, ANA, and hepatitis C. Ultrasound revealed no splenomegaly.  He has had multiple phlebotomies beginning in 09/2012. He undergoes small phlebotomies (250-300 cc) with IVF to maintain a hematocrit less than 52.  His diet consists of beans, potatoes, cabbage, and greens. He eats hamberger 3 times a year and chicken rarely.  He doesn't eat seeds because of diverticulitis.   Symptomatically, he denies any new complaints.  Past Medical History  Diagnosis Date  . Hypertension   . Carboxyhemoglobinemia   . Thrombocytopenia   . Polycythemia, secondary 10/20/2014    Past Surgical History  Procedure Laterality Date  . Polycythemia    . Hernia repair    . Appendectomy      Family History  Problem Relation Age of Onset  . Cancer Mother   . Cancer Sister   . Cancer Brother     Social History:  reports that he has been smoking Cigarettes.  He does not have any smokeless tobacco history on file. His alcohol and drug histories are not on file.  He smokes 1 pack per day.  He  drinks 2 beers per night.  He lives in [place] with his wife.    Allergies:  Allergies  Allergen Reactions  . No Known Allergies     Current Medications: Current Outpatient Prescriptions  Medication Sig Dispense Refill  . amLODipine (NORVASC) 5 MG tablet Take 1 tablet by mouth.    Marland Kitchen aspirin 81 MG tablet ASA 81 mg po once a day (start taking it from 08/02/2013)   Quantity: 0;  Refills: 0  Active    . atorvastatin (LIPITOR) 20 MG tablet Take 1 tablet by mouth.    . budesonide-formoterol (SYMBICORT) 160-4.5 MCG/ACT inhaler Inhale 2 puffs into the lungs 2 (two) times daily.     No current facility-administered medications for this visit.    Review of Systems:  GENERAL:  Feels fine.  No fevers, sweats or weight loss. PERFORMANCE STATUS (ECOG):  1 HEENT:  No visual changes, runny nose, sore throat, mouth sores or tenderness. Lungs: No shortness of breath or cough.  No hemoptysis. Cardiac:  No chest pain, palpitations, orthopnea, or PND. GI:  No nausea, vomiting, diarrhea, constipation, melena or hematochezia. GU:  No urgency, frequency, dysuria, or hematuria. Musculoskeletal:  No back pain.  No joint pain.  No muscle tenderness. Extremities:  No pain or swelling. Skin:  No rashes or skin changes. Neuro:  No headache, numbness or weakness, balance or coordination issues. Endocrine:  No diabetes, thyroid issues, hot flashes or night sweats. Psych:  No mood changes, depression  or anxiety. Pain:  No focal pain. Review of systems:  All other systems reviewed and found to be negative.  Physical Exam: Blood pressure 142/74, pulse 97, temperature 95.3 F (35.2 C), temperature source Tympanic, height _0  (1.753 m), weight 130 lb 8.2 oz (59.2 kg). GENERAL:  Thin gentleman sitting comfortably in the exam room in no acute distress. MENTAL STATUS:  Alert and oriented to person, place and time. HEAD:  Wearing a black cap.  Normocephalic, atraumatic, face symmetric, no Cushingoid  features. EYES:  Brown eyes.  Pupils equal round and reactive to light and accomodation.  No conjunctivitis or scleral icterus. ENT:  Oropharynx clear without lesion.  Poor dentition.  Tongue normal. Mucous membranes moist.  RESPIRATORY:  Clear to auscultation without rales, wheezes or rhonchi. CARDIOVASCULAR:  Regular rate and rhythm without murmur, rub or gallop. ABDOMEN:  Ventral hernia.  Soft, non-tender, with active bowel sounds, and no hepatosplenomegaly.  No masses. SKIN:  No rashes, ulcers or lesions. EXTREMITIES: No edema, no skin discoloration or tenderness.  No palpable cords. LYMPH NODES: No palpable cervical, supraclavicular, axillary or inguinal adenopathy  NEUROLOGICAL: Unremarkable. PSYCH:  Appropriate.  Appointment on 11/02/2014  Component Date Value Ref Range Status  . WBC 11/02/2014 7.2  3.8 - 10.6 K/uL Final  . RBC 11/02/2014 5.87  4.40 - 5.90 MIL/uL Final  . Hemoglobin 11/02/2014 16.4  13.0 - 18.0 g/dL Final  . HCT 11/02/2014 51.9  40.0 - 52.0 % Final  . MCV 11/02/2014 88.5  80.0 - 100.0 fL Final  . MCH 11/02/2014 28.0  26.0 - 34.0 pg Final  . MCHC 11/02/2014 31.7* 32.0 - 36.0 g/dL Final  . RDW 11/02/2014 19.1* 11.5 - 14.5 % Final  . Platelets 11/02/2014 PENDING  150 - 400 K/uL Incomplete  . Neutrophils Relative % 11/02/2014 57%   Final  . Neutro Abs 11/02/2014 4.1  1.4 - 6.5 K/uL Final  . Lymphocytes Relative 11/02/2014 28%   Final  . Lymphs Abs 11/02/2014 2.0  1.0 - 3.6 K/uL Final  . Monocytes Relative 11/02/2014 13%   Final  . Monocytes Absolute 11/02/2014 0.9  0.2 - 1.0 K/uL Final  . Eosinophils Relative 11/02/2014 1%   Final  . Eosinophils Absolute 11/02/2014 0.1  0 - 0.7 K/uL Final  . Basophils Relative 11/02/2014 1%   Final  . Basophils Absolute 11/02/2014 0.0  0 - 0.1 K/uL Final    Assessment:  Patrick Ayala is a 74 y.o. male with secondary polycythemia and thrombocytopenia of unclear etiology.  He smokes and has a history of an elevated  carboxyhemoglobin as high as 23% attributed to smoking in a small unventilated room for many hours and a truck exhaust leak (fixed about a year ago).  He has carbon monoxide  detectors. He smokes 1 pack per day, but is trying to cut back.  Prior work-up noted the following normal labs:  erythropoietin level, iron studies, JAK2, HIV, ANA, and hepatitis C. Ultrasound revealed no splenomegaly.  He has had multiple phlebotomies beginning in 09/2012. He undergoes small phlebotomies (250-300 cc) with supplimental IVF to maintain a hematocrit less than 52.  Symptomatically, he denies any complaints.  Diet is modest.  Exam is unremarkable.  Plan: 1. Discuss with patient entire medical history, diagnosis of erythrocytosis and treatment to date.  Discuss thrombocytopenia and work-up to date. 2. Labs today:  CBC with diff. 3. Phlebotomy 250 cc. 4. RTC in 1 month for labs (CBC with diff, hepatitis B core antibody, B12, folate,  TSH, SPEP) +/- phlebotomy.   Lequita Asal, MD  11/02/2014, 10:18 AM

## 2014-11-02 NOTE — Progress Notes (Signed)
Pt here today for follow up regarding polycythemia; former [facility] pt; offers no complaints today

## 2014-11-17 ENCOUNTER — Other Ambulatory Visit: Payer: Self-pay | Admitting: Family Medicine

## 2014-12-06 ENCOUNTER — Encounter: Payer: Self-pay | Admitting: Hematology and Oncology

## 2014-12-07 ENCOUNTER — Telehealth: Payer: Self-pay | Admitting: *Deleted

## 2014-12-07 ENCOUNTER — Inpatient Hospital Stay: Payer: Medicare PPO

## 2014-12-07 ENCOUNTER — Inpatient Hospital Stay: Payer: Medicare PPO | Attending: Hematology and Oncology | Admitting: Hematology and Oncology

## 2014-12-07 VITALS — BP 166/83 | HR 86 | Temp 96.1°F | Resp 16 | Wt 130.6 lb

## 2014-12-07 DIAGNOSIS — Z79899 Other long term (current) drug therapy: Secondary | ICD-10-CM

## 2014-12-07 DIAGNOSIS — Z7982 Long term (current) use of aspirin: Secondary | ICD-10-CM | POA: Diagnosis not present

## 2014-12-07 DIAGNOSIS — Z809 Family history of malignant neoplasm, unspecified: Secondary | ICD-10-CM | POA: Insufficient documentation

## 2014-12-07 DIAGNOSIS — R634 Abnormal weight loss: Secondary | ICD-10-CM

## 2014-12-07 DIAGNOSIS — I10 Essential (primary) hypertension: Secondary | ICD-10-CM | POA: Diagnosis not present

## 2014-12-07 DIAGNOSIS — D751 Secondary polycythemia: Secondary | ICD-10-CM | POA: Insufficient documentation

## 2014-12-07 DIAGNOSIS — D696 Thrombocytopenia, unspecified: Secondary | ICD-10-CM | POA: Insufficient documentation

## 2014-12-07 DIAGNOSIS — F17210 Nicotine dependence, cigarettes, uncomplicated: Secondary | ICD-10-CM | POA: Diagnosis not present

## 2014-12-07 LAB — CBC WITH DIFFERENTIAL/PLATELET
Basophils Absolute: 0.1 10*3/uL (ref 0–0.1)
Basophils Relative: 1 %
Eosinophils Absolute: 0.1 10*3/uL (ref 0–0.7)
Eosinophils Relative: 1 %
HCT: 51.6 % (ref 40.0–52.0)
Hemoglobin: 16.8 g/dL (ref 13.0–18.0)
Lymphocytes Relative: 25 %
Lymphs Abs: 1.7 10*3/uL (ref 1.0–3.6)
MCH: 29.6 pg (ref 26.0–34.0)
MCHC: 32.5 g/dL (ref 32.0–36.0)
MCV: 91 fL (ref 80.0–100.0)
Monocytes Absolute: 0.8 10*3/uL (ref 0.2–1.0)
Monocytes Relative: 11 %
Neutro Abs: 4.3 10*3/uL (ref 1.4–6.5)
Neutrophils Relative %: 62 %
Platelets: 78 10*3/uL — ABNORMAL LOW (ref 150–440)
RBC: 5.67 MIL/uL (ref 4.40–5.90)
RDW: 18.6 % — ABNORMAL HIGH (ref 11.5–14.5)
WBC: 6.9 10*3/uL (ref 3.8–10.6)

## 2014-12-07 LAB — IRON AND TIBC
Iron: 60 ug/dL (ref 45–182)
Saturation Ratios: 14 % — ABNORMAL LOW (ref 17.9–39.5)
TIBC: 421 ug/dL (ref 250–450)
UIBC: 361 ug/dL

## 2014-12-07 LAB — VITAMIN B12: Vitamin B-12: 260 pg/mL (ref 180–914)

## 2014-12-07 LAB — FOLATE: Folate: 10.2 ng/mL (ref >5.9)

## 2014-12-07 LAB — FERRITIN: Ferritin: 19 ng/mL — ABNORMAL LOW (ref 24–336)

## 2014-12-07 LAB — TSH: TSH: 1.324 u[IU]/mL (ref 0.350–4.500)

## 2014-12-07 NOTE — Progress Notes (Signed)
[facility] day:  12/07/2014  Chief Complaint: MAXDEN NAJI is a 74 y.o. male with secondary polycythemia and thrombocytopenia who is seen for 1 month assessment.  HPI: The patient was last seen in the medical oncology clinic on 11/02/2014.  At that time, he denied any complaint. CBC included a hematocrit of 51.9, hemoglobin 16.4, platelets 83,000, WBC 7200 with an ANC of 4100.  As he was above his goal, he underwent phlebotomy of 250 cc.   During the interim, he has felt good. He states that he has lost some weight. Colonoscopy is coming up. He had one about 2 years ago when hospitalized. He has also been called recently for a follow-up appointment with his pulmonologist.  Past Medical History  Diagnosis Date  . Hypertension   . Carboxyhemoglobinemia   . Thrombocytopenia   . Polycythemia, secondary 10/20/2014    Past Surgical History  Procedure Laterality Date  . Polycythemia    . Hernia repair    . Appendectomy      Family History  Problem Relation Age of Onset  . Cancer Mother   . Cancer Sister   . Cancer Brother     Social History:  reports that he has been smoking Cigarettes.  He does not have any smokeless tobacco history on file. His alcohol and drug histories are not on file.  He smokes 1 pack per day.  He drinks 2 beers per night.  He is accompanied by his wife, [name].  They live in [place].    Allergies:  Allergies  Allergen Reactions  . No Known Allergies     Current Medications: Current Outpatient Prescriptions  Medication Sig Dispense Refill  . amLODipine (NORVASC) 5 MG tablet Take 1 tablet by mouth.    Marland Kitchen aspirin 81 MG tablet ASA 81 mg po once a day (start taking it from 08/02/2013)   Quantity: 0;  Refills: 0  Active    . atorvastatin (LIPITOR) 20 MG tablet Take 1 tablet by mouth.    . SYMBICORT 160-4.5 MCG/ACT inhaler INHALE 2 TIMES DAILY 30.6 Inhaler 9   No current facility-administered medications  for this visit.    Review of Systems:  GENERAL:  Feels good.  No fevers or sweats.  Weight loss. PERFORMANCE STATUS (ECOG):  1 HEENT:  No visual changes, runny nose, sore throat, mouth sores or tenderness. Lungs: No shortness of breath or cough.  No hemoptysis. Cardiac:  No chest pain, palpitations, orthopnea, or PND. GI:  No nausea, vomiting, diarrhea, constipation, melena or hematochezia. GU:  No urgency, frequency, dysuria, or hematuria. Musculoskeletal:  No back pain.  No joint pain.  No muscle tenderness. Extremities:  No pain or swelling. Skin:  No rashes or skin changes. Neuro:  No headache, numbness or weakness, balance or coordination issues. Endocrine:  No diabetes, thyroid issues, hot flashes or night sweats. Psych:  No mood changes, depression or anxiety. Pain:  No focal pain. Review of systems:  All other systems reviewed and found to be negative.  Physical Exam: Blood pressure 166/83, pulse 86, temperature 96.1 F (35.6 C), temperature source Tympanic, resp. rate 16, weight 130 lb 10 oz (59.25 kg), SpO2 94 %. GENERAL:  Thin gentleman sitting comfortably in the exam room in no acute distress. MENTAL STATUS:  Alert and oriented to person, place and time. HEAD:  Wearing a black cap.  Gray hair.  Mustache.  Normocephalic, atraumatic, face symmetric, no Cushingoid features. EYES:  Brown eyes.  Pupils equal round and reactive to light and accomodation.  No conjunctivitis or scleral icterus. ENT:  Oropharynx clear without lesion.  Poor dentition.  Missing teeth.  Tongue normal. Mucous membranes moist.  RESPIRATORY:  Intermittent soft wheeze.  Otherwise, clear to auscultation without rales or rhonchi. CARDIOVASCULAR:  Regular rate and rhythm without murmur, rub or gallop. ABDOMEN:  Ventral hernia.  Soft, non-tender, with active bowel sounds, and no hepatosplenomegaly.  No masses. SKIN:  No rashes, ulcers or lesions. EXTREMITIES: No edema, no skin discoloration or tenderness.  No  palpable cords. LYMPH NODES: No palpable cervical, supraclavicular, axillary or inguinal adenopathy  NEUROLOGICAL: Unremarkable. PSYCH:  Appropriate.  Appointment on 12/07/2014  Component Date Value Ref Range Status  . WBC 12/07/2014 6.9  3.8 - 10.6 K/uL Final  . RBC 12/07/2014 5.67  4.40 - 5.90 MIL/uL Final  . Hemoglobin 12/07/2014 16.8  13.0 - 18.0 g/dL Final  . HCT 12/07/2014 51.6  40.0 - 52.0 % Final  . MCV 12/07/2014 91.0  80.0 - 100.0 fL Final  . MCH 12/07/2014 29.6  26.0 - 34.0 pg Final  . MCHC 12/07/2014 32.5  32.0 - 36.0 g/dL Final  . RDW 12/07/2014 18.6* 11.5 - 14.5 % Final  . Platelets 12/07/2014 78* 150 - 440 K/uL Final  . Neutrophils Relative % 12/07/2014 62   Final  . Neutro Abs 12/07/2014 4.3  1.4 - 6.5 K/uL Final  . Lymphocytes Relative 12/07/2014 25   Final  . Lymphs Abs 12/07/2014 1.7  1.0 - 3.6 K/uL Final  . Monocytes Relative 12/07/2014 11   Final  . Monocytes Absolute 12/07/2014 0.8  0.2 - 1.0 K/uL Final  . Eosinophils Relative 12/07/2014 1   Final  . Eosinophils Absolute 12/07/2014 0.1  0 - 0.7 K/uL Final  . Basophils Relative 12/07/2014 1   Final  . Basophils Absolute 12/07/2014 0.1  0 - 0.1 K/uL Final    Assessment:  KYVON HU is a 74 y.o. male with secondary polycythemia and thrombocytopenia of unclear etiology.  He smokes and has a history of an elevated carboxyhemoglobin as high as 23% attributed to smoking in a small unventilated room for many hours and a truck exhaust leak (fixed about a year ago).  He has carbon monoxide  detectors. He smokes 1 pack per day, but is trying to cut back.  Prior work-up noted the following normal labs:  erythropoietin level, iron studies, JAK2, HIV, ANA, and hepatitis C. Ultrasound revealed no splenomegaly.  He has had multiple phlebotomies beginning in 09/2012. He undergoes small phlebotomies (250-300 cc) with supplimental IVF to maintain a hematocrit less than 52.  Symptomatically, he feels good.  Diet is modest.  He  has lost weight.  Exam reveals scattered soft wheezes.  Plan: 1. Labs today:  CBC with diff, ferritin, iron studies, B12, folate, TSH, SPEP. 2. Phlebotomy 250 cc. 3. Follow-up as scheduled with pulmonary medicine.  Anticipate spiral CT scan. 4. Follow-up evalation with GI medicine per patient report. 5. RTC in 1 month for MD assessment and labs (CBC with diff +/- others).  Addendum:  B12 level was low normal.  Check methylmalonic acid (MMA) to r/o B12 deficiency.   Lequita Asal, MD  12/07/2014, 11:10 AM

## 2014-12-07 NOTE — Telephone Encounter (Signed)
  Oncology Nurse Navigator Documentation    Navigator Encounter Type: Telephone;Screening (12/07/14 0900)                      Time Spent with Patient: 15 (12/07/14 0900)   Notified patient that annual lung cancer screening low dose CT scan is due. Confirmed that patient is within the age range of 55-77, and asymptomatic, (no signs or symptoms of lung cancer). The patient is a current smoker, with a 31 pack year history. The shared decision making visit was done 10/24/13. Patient is agreeable for CT scan being scheduled.

## 2014-12-08 LAB — PROTEIN ELECTROPHORESIS, SERUM
A/G Ratio: 1.2 (ref 0.7–1.7)
Albumin ELP: 3.9 g/dL (ref 2.9–4.4)
Alpha-1-Globulin: 0.3 g/dL (ref 0.0–0.4)
Alpha-2-Globulin: 0.6 g/dL (ref 0.4–1.0)
Beta Globulin: 1 g/dL (ref 0.7–1.3)
Gamma Globulin: 1.5 g/dL (ref 0.4–1.8)
Globulin, Total: 3.2 g/dL (ref 2.2–3.9)
Total Protein ELP: 7.1 g/dL (ref 6.0–8.5)

## 2014-12-09 ENCOUNTER — Other Ambulatory Visit: Payer: Self-pay

## 2014-12-11 ENCOUNTER — Telehealth: Payer: Self-pay

## 2014-12-11 NOTE — Telephone Encounter (Signed)
Navigator Encounter Type: Telephone, Screening  Notified patient that annual lung cancer screening low dose CT scan is due. Confirmed that patient is within the age range 8977, asymptomatic. The patient is a smoker with a 31 pkyear history. Shared Decision Making Visit was done 10/24/13. Patient is agreeable to scheduling of CT scan.

## 2014-12-14 ENCOUNTER — Encounter: Payer: Self-pay | Admitting: Hematology and Oncology

## 2014-12-21 ENCOUNTER — Other Ambulatory Visit: Payer: Self-pay | Admitting: Family Medicine

## 2014-12-21 DIAGNOSIS — F17219 Nicotine dependence, cigarettes, with unspecified nicotine-induced disorders: Secondary | ICD-10-CM | POA: Insufficient documentation

## 2014-12-21 DIAGNOSIS — Z87891 Personal history of nicotine dependence: Secondary | ICD-10-CM

## 2014-12-22 ENCOUNTER — Other Ambulatory Visit: Payer: Self-pay | Admitting: Family Medicine

## 2014-12-24 ENCOUNTER — Ambulatory Visit
Admission: RE | Admit: 2014-12-24 | Discharge: 2014-12-24 | Disposition: A | Discharge status: Home / Self Care | Payer: Medicare PPO | Source: Ambulatory Visit | Attending: Family Medicine | Admitting: Family Medicine

## 2014-12-24 DIAGNOSIS — R911 Solitary pulmonary nodule: Secondary | ICD-10-CM | POA: Diagnosis not present

## 2014-12-24 DIAGNOSIS — I700 Atherosclerosis of aorta: Secondary | ICD-10-CM | POA: Insufficient documentation

## 2014-12-24 DIAGNOSIS — J439 Emphysema, unspecified: Secondary | ICD-10-CM | POA: Insufficient documentation

## 2014-12-24 DIAGNOSIS — Z87891 Personal history of nicotine dependence: Secondary | ICD-10-CM | POA: Diagnosis present

## 2014-12-25 ENCOUNTER — Ambulatory Visit: Payer: Medicare PPO | Admitting: Family Medicine

## 2014-12-28 ENCOUNTER — Telehealth: Payer: Self-pay | Admitting: *Deleted

## 2014-12-28 NOTE — Telephone Encounter (Signed)
  Oncology Nurse Navigator Documentation    Navigator Encounter Type: Telephone;Screening (12/28/14 1200)                          Notified patient of LDCT lung cancer screening results of Lung screening finding with recommendation for 12 month follow up imaging. Also notified of incidental finding noted below. Patient verbalizes understanding.   IMPRESSION: 1. New left upper lobe lung nodule measures 4 mm. If the patient is at high risk for bronchogenic carcinoma, follow-up chest CT at 1 year is recommended. If the patient is at low risk, no follow-up is needed. This recommendation follows the consensus statement: Guidelines for Management of Small Pulmonary Nodules Detected on CT Scans: A Statement from the [place] as published in Radiology 2005; 237:395-400. 2. Emphysema 3. Aortic atherosclerosis.

## 2015-01-07 ENCOUNTER — Inpatient Hospital Stay: Payer: Medicare PPO

## 2015-01-07 ENCOUNTER — Inpatient Hospital Stay: Payer: Medicare PPO | Admitting: Hematology and Oncology

## 2015-01-07 ENCOUNTER — Inpatient Hospital Stay: Payer: Medicare PPO | Attending: Hematology and Oncology

## 2015-01-22 ENCOUNTER — Inpatient Hospital Stay: Payer: Medicare PPO | Attending: Hematology and Oncology | Admitting: Hematology and Oncology

## 2015-01-22 ENCOUNTER — Other Ambulatory Visit: Payer: Self-pay | Admitting: *Deleted

## 2015-01-22 ENCOUNTER — Inpatient Hospital Stay: Payer: Medicare PPO

## 2015-01-22 DIAGNOSIS — D751 Secondary polycythemia: Secondary | ICD-10-CM | POA: Insufficient documentation

## 2015-01-22 DIAGNOSIS — Z7982 Long term (current) use of aspirin: Secondary | ICD-10-CM

## 2015-01-22 DIAGNOSIS — R918 Other nonspecific abnormal finding of lung field: Secondary | ICD-10-CM | POA: Diagnosis not present

## 2015-01-22 DIAGNOSIS — Z79899 Other long term (current) drug therapy: Secondary | ICD-10-CM | POA: Diagnosis not present

## 2015-01-22 DIAGNOSIS — D696 Thrombocytopenia, unspecified: Secondary | ICD-10-CM | POA: Insufficient documentation

## 2015-01-22 DIAGNOSIS — E538 Deficiency of other specified B group vitamins: Secondary | ICD-10-CM | POA: Insufficient documentation

## 2015-01-22 DIAGNOSIS — R634 Abnormal weight loss: Secondary | ICD-10-CM | POA: Insufficient documentation

## 2015-01-22 DIAGNOSIS — Z9981 Dependence on supplemental oxygen: Secondary | ICD-10-CM | POA: Diagnosis not present

## 2015-01-22 DIAGNOSIS — Z809 Family history of malignant neoplasm, unspecified: Secondary | ICD-10-CM

## 2015-01-22 DIAGNOSIS — F17210 Nicotine dependence, cigarettes, uncomplicated: Secondary | ICD-10-CM

## 2015-01-22 LAB — CBC WITH DIFFERENTIAL/PLATELET
Basophils Absolute: 0.1 10*3/uL (ref 0–0.1)
Basophils Relative: 1 %
Eosinophils Absolute: 0.1 10*3/uL (ref 0–0.7)
Eosinophils Relative: 1 %
HCT: 54 % — ABNORMAL HIGH (ref 40.0–52.0)
Hemoglobin: 17.7 g/dL (ref 13.0–18.0)
Lymphocytes Relative: 23 %
Lymphs Abs: 1.9 10*3/uL (ref 1.0–3.6)
MCH: 29.9 pg (ref 26.0–34.0)
MCHC: 32.7 g/dL (ref 32.0–36.0)
MCV: 91.4 fL (ref 80.0–100.0)
Monocytes Absolute: 1.2 10*3/uL — ABNORMAL HIGH (ref 0.2–1.0)
Monocytes Relative: 14 %
Neutro Abs: 5.3 10*3/uL (ref 1.4–6.5)
Neutrophils Relative %: 61 %
Platelets: 92 10*3/uL — ABNORMAL LOW (ref 150–440)
RBC: 5.91 MIL/uL — ABNORMAL HIGH (ref 4.40–5.90)
RDW: 16.1 % — ABNORMAL HIGH (ref 11.5–14.5)
WBC: 8.7 10*3/uL (ref 3.8–10.6)

## 2015-01-22 NOTE — Progress Notes (Signed)
Pt reports no changes since last visit

## 2015-01-22 NOTE — Progress Notes (Signed)
[facility] day:  01/22/2015  Chief Complaint: Patrick Ayala is a 74 y.o. male with secondary polycythemia and thrombocytopenia who is seen for 1 month assessment.  HPI: The patient was last seen in the medical oncology clinic on 12/07/2014.  At that time, he felt good.  Diet was modest. He had lost some weight.  Exam reveals scattered soft wheezes. CBC included a hematocrit of 51.6, hemoglobin 16.8, and MCV 91.  He underwent phlebotomy of 250 cc.  He was to have followed up with pulmonary medicine as well as GI.  Labs were also notable for a low normal B12.  During the interim, he has felt good. He states that he continues to use oxygen at home at night. Sometimes he uses it during the day. He did follow-up with pulmonary medicine. Chest CT 12/24/2014 revealed a 4 mm left upper lobe nodule for which a follow-up chest CT was recommended in one year.   Past Medical History  Diagnosis Date  . Carboxyhemoglobinemia   . Thrombocytopenia ([place])   . Polycythemia, secondary 10/20/2014    Past Surgical History  Procedure Laterality Date  . Polycythemia    . Hernia repair    . Appendectomy      Family History  Problem Relation Age of Onset  . Cancer Mother   . Cancer Sister   . Cancer Brother     Social History:  reports that he has been smoking Cigarettes.  He has been smoking about 1.00 pack per day. He has never used smokeless tobacco. He reports that he drinks alcohol. He reports that he does not use illicit drugs.  He smokes 1 pack per day.  He drinks 2 beers per night.  He lives in [place].    Allergies:  Allergies  Allergen Reactions  . No Known Allergies     Current Medications: Current Outpatient Prescriptions  Medication Sig Dispense Refill  . aspirin 81 MG tablet ASA 81 mg po once a day (start taking it from 08/02/2013)   Quantity: 0;  Refills: 0  Active    . amLODipine (NORVASC) 5 MG tablet Take 1 tablet (5 mg total) by  mouth daily. 90 tablet 2  . atorvastatin (LIPITOR) 20 MG tablet Take 1 tablet (20 mg total) by mouth daily at 6 PM. 90 tablet 2  . budesonide-formoterol (SYMBICORT) 160-4.5 MCG/ACT inhaler INHALE 2 TIMES DAILY 30.6 Inhaler 9   No current facility-administered medications for this visit.    Review of Systems:  GENERAL:  Feels good.  No fevers or sweats.  Weight stable. PERFORMANCE STATUS (ECOG):  1 HEENT:  No visual changes, runny nose, sore throat, mouth sores or tenderness. Lungs: No shortness of breath or cough.  No hemoptysis. Cardiac:  No chest pain, palpitations, orthopnea, or PND. GI:  Eating well.  No nausea, vomiting, diarrhea, constipation, melena or hematochezia. GU:  No urgency, frequency, dysuria, or hematuria. Musculoskeletal:  No back pain.  No joint pain.  No muscle tenderness. Extremities:  No pain or swelling. Skin:  No rashes or skin changes. Neuro:  No headache, numbness or weakness, balance or coordination issues. Endocrine:  No diabetes, thyroid issues, hot flashes or night sweats. Psych:  No mood changes, depression or anxiety. Pain:  No focal pain. Review of systems:  All other systems reviewed and found to be negative.  Physical Exam: Blood pressure 138/79, pulse 108, temperature 95.8 F (35.4 C), temperature source Tympanic, resp. rate 18, height 5'  9" (1.753 m), weight 127 lb 15.6 oz (58.05 kg), SpO2 89 %. GENERAL:  Thin gentleman sitting comfortably in the exam room in no acute distress. MENTAL STATUS:  Alert and oriented to person, place and time. HEAD:  Wearing a black hat.  Gray hair.  Mustache.  Normocephalic, atraumatic, face symmetric, no Cushingoid features. EYES:  Brown eyes.  Pupils equal round and reactive to light and accomodation.  No conjunctivitis or scleral icterus. ENT:  Oropharynx clear without lesion. Missing teeth.  Tongue normal. Mucous membranes moist.  RESPIRATORY:  Slight soft wheeze.  Otherwise, clear to auscultation without rales or  rhonchi. CARDIOVASCULAR:  Regular rate and rhythm without murmur, rub or gallop. ABDOMEN:  Ventral hernia.  Soft, non-tender, with active bowel sounds, and no hepatosplenomegaly.  No masses. SKIN:  No rashes, ulcers or lesions. EXTREMITIES: No edema, no skin discoloration or tenderness.  No palpable cords. LYMPH NODES: No palpable cervical, supraclavicular, axillary or inguinal adenopathy  NEUROLOGICAL: Unremarkable. PSYCH:  Appropriate.  Office Visit on 01/22/2015  Component Date Value Ref Range Status  . WBC 01/22/2015 8.7  3.8 - 10.6 K/uL Final  . RBC 01/22/2015 5.91* 4.40 - 5.90 MIL/uL Final  . Hemoglobin 01/22/2015 17.7  13.0 - 18.0 g/dL Final  . HCT 01/22/2015 54.0* 40.0 - 52.0 % Final  . MCV 01/22/2015 91.4  80.0 - 100.0 fL Final  . MCH 01/22/2015 29.9  26.0 - 34.0 pg Final  . MCHC 01/22/2015 32.7  32.0 - 36.0 g/dL Final  . RDW 01/22/2015 16.1* 11.5 - 14.5 % Final  . Platelets 01/22/2015 92* 150 - 440 K/uL Final  . Neutrophils Relative % 01/22/2015 61   Final  . Neutro Abs 01/22/2015 5.3  1.4 - 6.5 K/uL Final  . Lymphocytes Relative 01/22/2015 23   Final  . Lymphs Abs 01/22/2015 1.9  1.0 - 3.6 K/uL Final  . Monocytes Relative 01/22/2015 14   Final  . Monocytes Absolute 01/22/2015 1.2* 0.2 - 1.0 K/uL Final  . Eosinophils Relative 01/22/2015 1   Final  . Eosinophils Absolute 01/22/2015 0.1  0 - 0.7 K/uL Final  . Basophils Relative 01/22/2015 1   Final  . Basophils Absolute 01/22/2015 0.1  0 - 0.1 K/uL Final  Appointment on 01/22/2015  Component Date Value Ref Range Status  . Methylmalonic Acid, Quantitative 01/22/2015 406* 0 - 378 nmol/L Final   Comment: (NOTE) Performed At: [facility] [place], [place] [id number] Lindon Romp MD FO:[id number]     Assessment:  Patrick Ayala is a 74 y.o. male with secondary polycythemia and thrombocytopenia of unclear etiology.  He smokes and has a history of an elevated carboxyhemoglobin as high as 23%  attributed to smoking in a small unventilated room for many hours and a truck exhaust leak (fixed about a year ago).  He has carbon monoxide  detectors. He smokes 1 pack per day, but is trying to cut back.  Prior work-up noted the following normal labs:  erythropoietin level, iron studies, JAK2, HIV, ANA, and hepatitis C. Ultrasound revealed no splenomegaly.  Chest CT on 12/24/2014 for surveillance revealed a new 4 mm LUL nodule.  He has had multiple phlebotomies beginning in 09/2012. He undergoes small phlebotomies (250-300 cc) with supplimental IVF to maintain a hematocrit less than 52.  He has B12 deficiency.  B12 was 260 (low normal) on 12/07/2014.  Symptomatically, he feels good. Exam reveals scattered soft wheezes.  Plan: 1. Labs today:  CBC with diff, MMA. 2. Phlebotomy  300 cc. 3. Follow-up evalation with GI medicine per patient report. 4. RTC in 1 month for labs (CBC with diff) +/- phlebotomy. 5. RTC in 2 months for MD assess, labs (CBC with diff, ferritin, iron studies ) +/- phlebotomy.  Addendum:  MMA was elevated B12 deficiency.  He will be scheduled for B12 weekly x 6 then monthly.   Lequita Asal, MD  01/22/2015, 2:31 PM

## 2015-01-27 ENCOUNTER — Other Ambulatory Visit: Payer: Self-pay | Admitting: Hematology and Oncology

## 2015-01-27 ENCOUNTER — Telehealth: Payer: Self-pay

## 2015-01-27 DIAGNOSIS — E538 Deficiency of other specified B group vitamins: Secondary | ICD-10-CM | POA: Insufficient documentation

## 2015-01-27 LAB — METHYLMALONIC ACID, SERUM: Methylmalonic Acid, Quantitative: 406 nmol/L — ABNORMAL HIGH (ref 0–378)

## 2015-01-27 NOTE — Telephone Encounter (Signed)
Called and spoke with pt and he verbalized that he would be in Monday for b-12 injection.

## 2015-01-29 ENCOUNTER — Ambulatory Visit: Payer: Medicare PPO

## 2015-02-01 ENCOUNTER — Inpatient Hospital Stay: Payer: Medicare PPO

## 2015-02-01 ENCOUNTER — Ambulatory Visit (INDEPENDENT_AMBULATORY_CARE_PROVIDER_SITE_OTHER): Payer: Medicare PPO | Admitting: Family Medicine

## 2015-02-01 ENCOUNTER — Encounter: Payer: Self-pay | Admitting: Family Medicine

## 2015-02-01 ENCOUNTER — Ambulatory Visit: Payer: Medicare PPO

## 2015-02-01 VITALS — BP 127/68 | HR 109 | Temp 98.4°F | Ht 70.0 in | Wt 127.0 lb

## 2015-02-01 DIAGNOSIS — E785 Hyperlipidemia, unspecified: Secondary | ICD-10-CM

## 2015-02-01 DIAGNOSIS — D751 Secondary polycythemia: Secondary | ICD-10-CM | POA: Diagnosis not present

## 2015-02-01 DIAGNOSIS — J410 Simple chronic bronchitis: Secondary | ICD-10-CM | POA: Diagnosis not present

## 2015-02-01 DIAGNOSIS — I10 Essential (primary) hypertension: Secondary | ICD-10-CM

## 2015-02-01 DIAGNOSIS — E538 Deficiency of other specified B group vitamins: Secondary | ICD-10-CM

## 2015-02-01 DIAGNOSIS — R634 Abnormal weight loss: Secondary | ICD-10-CM | POA: Diagnosis not present

## 2015-02-01 DIAGNOSIS — J449 Chronic obstructive pulmonary disease, unspecified: Secondary | ICD-10-CM | POA: Insufficient documentation

## 2015-02-01 MED ORDER — BUDESONIDE-FORMOTEROL FUMARATE 160-4.5 MCG/ACT IN AERO: INHALATION_SPRAY | RESPIRATORY_TRACT | Status: DC

## 2015-02-01 MED ORDER — ATORVASTATIN CALCIUM 20 MG PO TABS: 20.0000 mg | ORAL_TABLET | Freq: Every day | ORAL | Status: DC

## 2015-02-01 MED ORDER — CYANOCOBALAMIN 1000 MCG/ML IJ SOLN
1000.0000 ug | Freq: Once | INTRAMUSCULAR | Status: AC
  Administered 2015-02-01: 1000 ug via INTRAMUSCULAR
  Filled 2015-02-01: qty 1

## 2015-02-01 MED ORDER — AMLODIPINE BESYLATE 5 MG PO TABS: 5.0000 mg | ORAL_TABLET | Freq: Every day | ORAL | Status: DC

## 2015-02-01 NOTE — Addendum Note (Signed)
Addended byGolden Pop on: 02/01/2015 12:14 PM   Modules accepted: Orders

## 2015-02-01 NOTE — Progress Notes (Signed)
BP 127/68 mmHg  Pulse 109  Temp(Src) 98.4 F (36.9 C)  Ht _0  (1.778 m)  Wt 127 lb (57.607 kg)  BMI 18.22 kg/m2  SpO2 75%   Subjective:    Patient ID: Patrick Ayala, male    DOB: [birth year], 74 y.o.   MRN: [id number]  HPI: Patrick Ayala is a 74 y.o. male  Chief Complaint  Patient presents with  . Hyperlipidemia  . Hypertension   patient all in all doing well no complaints from medications Breathing is stable on Symbicort Taking medications faithfully with no side effects Working with the cancer center and having phlebotomies on a regular basis for thrombocytopenia and polycythemia.   Relevant past medical, surgical, family and social history reviewed and updated as indicated. Interim medical history since our last visit reviewed. Allergies and medications reviewed and updated.  Review of Systems  Constitutional: Negative.   Respiratory: Negative for apnea and chest tightness.   Cardiovascular: Negative.     Per HPI unless specifically indicated above     Objective:    BP 127/68 mmHg  Pulse 109  Temp(Src) 98.4 F (36.9 C)  Ht _1  (1.778 m)  Wt 127 lb (57.607 kg)  BMI 18.22 kg/m2  SpO2 75%  Wt Readings from Last 3 Encounters:  02/01/15 127 lb (57.607 kg)  01/22/15 127 lb 15.6 oz (58.05 kg)  12/07/14 130 lb 10 oz (59.25 kg)    Physical Exam  Constitutional: He is oriented to person, place, and time. He appears well-developed and well-nourished. No distress.  HENT:  Head: Normocephalic and atraumatic.  Right Ear: Hearing normal.  Left Ear: Hearing normal.  Nose: Nose normal.  Eyes: Conjunctivae and lids are normal. Right eye exhibits no discharge. Left eye exhibits no discharge. No scleral icterus.  Cardiovascular: Normal rate, regular rhythm and normal heart sounds.   Pulmonary/Chest: Effort normal. No respiratory distress. He has wheezes. He has no rales.  Poor air movement  Musculoskeletal: Normal range of motion.  Neurological: He is alert  and oriented to person, place, and time.  Skin: Skin is intact. No rash noted.  Psychiatric: He has a normal mood and affect. His speech is normal and behavior is normal. Judgment and thought content normal. Cognition and memory are normal.    Results for orders placed or performed in visit on 01/22/15  CBC with Differential  Result Value Ref Range   WBC 8.7 3.8 - 10.6 K/uL   RBC 5.91 (H) 4.40 - 5.90 MIL/uL   Hemoglobin 17.7 13.0 - 18.0 g/dL   HCT 54.0 (H) 40.0 - 52.0 %   MCV 91.4 80.0 - 100.0 fL   MCH 29.9 26.0 - 34.0 pg   MCHC 32.7 32.0 - 36.0 g/dL   RDW 16.1 (H) 11.5 - 14.5 %   Platelets 92 (L) 150 - 440 K/uL   Neutrophils Relative % 61 %   Neutro Abs 5.3 1.4 - 6.5 K/uL   Lymphocytes Relative 23 %   Lymphs Abs 1.9 1.0 - 3.6 K/uL   Monocytes Relative 14 %   Monocytes Absolute 1.2 (H) 0.2 - 1.0 K/uL   Eosinophils Relative 1 %   Eosinophils Absolute 0.1 0 - 0.7 K/uL   Basophils Relative 1 %   Basophils Absolute 0.1 0 - 0.1 K/uL      Assessment & Plan:   Problem List Items Addressed This Visit      Cardiovascular and Mediastinum   Essential hypertension - Primary    The  current medical regimen is effective;  continue present plan and medications.       Relevant Medications   amLODipine (NORVASC) 5 MG tablet   atorvastatin (LIPITOR) 20 MG tablet     Respiratory   COPD (chronic obstructive pulmonary disease) (HCC)   Relevant Medications   budesonide-formoterol (SYMBICORT) 160-4.5 MCG/ACT inhaler     Other   Weight loss    Discussed diet in calories and nutrition      Hyperlipidemia    The current medical regimen is effective;  continue present plan and medications.       Relevant Medications   amLODipine (NORVASC) 5 MG tablet   atorvastatin (LIPITOR) 20 MG tablet       Follow up plan: Return in about 6 months (around 08/02/2015) for Physical Exam.

## 2015-02-01 NOTE — Assessment & Plan Note (Signed)
The current medical regimen is effective;  continue present plan and medications.

## 2015-02-01 NOTE — Assessment & Plan Note (Signed)
Discussed diet in calories and nutrition

## 2015-02-02 ENCOUNTER — Other Ambulatory Visit: Payer: Self-pay | Admitting: Hematology and Oncology

## 2015-02-03 ENCOUNTER — Encounter: Payer: Self-pay | Admitting: Family Medicine

## 2015-02-03 LAB — BASIC METABOLIC PANEL
BUN/Creatinine Ratio: 19 (ref 10–22)
BUN: 15 mg/dL (ref 8–27)
CALCIUM: 9.1 mg/dL (ref 8.6–10.2)
CHLORIDE: 95 mmol/L — AB (ref 97–106)
CO2: 32 mmol/L — AB (ref 18–29)
Creatinine, Ser: 0.78 mg/dL (ref 0.76–1.27)
GFR calc non Af Amer: 90 mL/min/{1.73_m2} (ref >59)
GFR, EST AFRICAN AMERICAN: 104 mL/min/{1.73_m2} (ref >59)
GLUCOSE: 88 mg/dL (ref 65–99)
POTASSIUM: 5 mmol/L (ref 3.5–5.2)
Sodium: 140 mmol/L (ref 136–144)

## 2015-02-03 LAB — LIPID PANEL W/O CHOL/HDL RATIO
Cholesterol, Total: 152 mg/dL (ref 100–199)
HDL: 86 mg/dL (ref >39)
LDL CALC: 49 mg/dL (ref 0–99)
TRIGLYCERIDES: 84 mg/dL (ref 0–149)
VLDL CHOLESTEROL CAL: 17 mg/dL (ref 5–40)

## 2015-02-03 LAB — AST: AST: 20 IU/L (ref 0–40)

## 2015-02-03 LAB — ALT: ALT: 12 IU/L (ref 0–44)

## 2015-02-05 ENCOUNTER — Ambulatory Visit: Payer: Medicare PPO

## 2015-02-08 ENCOUNTER — Inpatient Hospital Stay: Payer: Medicare PPO

## 2015-02-08 DIAGNOSIS — D751 Secondary polycythemia: Secondary | ICD-10-CM | POA: Diagnosis not present

## 2015-02-08 DIAGNOSIS — E538 Deficiency of other specified B group vitamins: Secondary | ICD-10-CM

## 2015-02-08 MED ORDER — CYANOCOBALAMIN 1000 MCG/ML IJ SOLN
1000.0000 ug | Freq: Once | INTRAMUSCULAR | Status: AC
  Administered 2015-02-08: 1000 ug via INTRAMUSCULAR
  Filled 2015-02-08: qty 1

## 2015-02-09 ENCOUNTER — Telehealth: Payer: Self-pay

## 2015-02-09 DIAGNOSIS — E785 Hyperlipidemia, unspecified: Secondary | ICD-10-CM

## 2015-02-09 DIAGNOSIS — I10 Essential (primary) hypertension: Secondary | ICD-10-CM

## 2015-02-09 MED ORDER — BUDESONIDE-FORMOTEROL FUMARATE 160-4.5 MCG/ACT IN AERO: INHALATION_SPRAY | RESPIRATORY_TRACT | Status: DC

## 2015-02-09 MED ORDER — AMLODIPINE BESYLATE 5 MG PO TABS: 5.0000 mg | ORAL_TABLET | Freq: Every day | ORAL | Status: DC

## 2015-02-09 MED ORDER — ATORVASTATIN CALCIUM 20 MG PO TABS
20.0000 mg | ORAL_TABLET | Freq: Every day | ORAL | Status: DC
Start: 2015-02-09 — End: 2015-07-19

## 2015-02-09 NOTE — Telephone Encounter (Signed)
Patient states he spoke with his mailorder service ([facility]) and they have not received his Rx's.  Please re send to [facility]

## 2015-02-10 ENCOUNTER — Telehealth: Payer: Self-pay | Admitting: Family Medicine

## 2015-02-10 NOTE — Telephone Encounter (Signed)
MAC re sent, spoke with [facility] 02/09/15 to verify directions

## 2015-02-10 NOTE — Telephone Encounter (Signed)
Pt stated he was in the office on 02/01/15 and that Dr. Jeananne Rama was supposed to send his RX in for amlodopine, symbicort, and atorvastatin the pharmacy has not received the RX. Pharm is [facility] through [place]. Pt is almost out of these medications. Thanks.

## 2015-02-12 ENCOUNTER — Ambulatory Visit: Payer: Medicare PPO

## 2015-02-15 ENCOUNTER — Inpatient Hospital Stay: Payer: Medicare PPO | Attending: Hematology and Oncology

## 2015-02-15 DIAGNOSIS — E538 Deficiency of other specified B group vitamins: Secondary | ICD-10-CM | POA: Diagnosis present

## 2015-02-15 DIAGNOSIS — Z79899 Other long term (current) drug therapy: Secondary | ICD-10-CM | POA: Diagnosis not present

## 2015-02-15 MED ORDER — CYANOCOBALAMIN 1000 MCG/ML IJ SOLN
1000.0000 ug | Freq: Once | INTRAMUSCULAR | Status: AC
  Administered 2015-02-15: 1000 ug via INTRAMUSCULAR
  Filled 2015-02-15: qty 1

## 2015-02-19 ENCOUNTER — Other Ambulatory Visit: Payer: Self-pay

## 2015-02-19 DIAGNOSIS — D751 Secondary polycythemia: Secondary | ICD-10-CM

## 2015-02-21 ENCOUNTER — Other Ambulatory Visit: Payer: Self-pay | Admitting: Hematology and Oncology

## 2015-02-21 ENCOUNTER — Encounter: Payer: Self-pay | Admitting: Hematology and Oncology

## 2015-02-22 ENCOUNTER — Inpatient Hospital Stay: Payer: Medicare PPO

## 2015-02-22 DIAGNOSIS — D751 Secondary polycythemia: Secondary | ICD-10-CM

## 2015-02-22 DIAGNOSIS — E538 Deficiency of other specified B group vitamins: Secondary | ICD-10-CM | POA: Diagnosis not present

## 2015-02-22 LAB — CBC WITH DIFFERENTIAL/PLATELET
Basophils Absolute: 0.1 10*3/uL (ref 0–0.1)
Basophils Relative: 1 %
Eosinophils Absolute: 0.1 10*3/uL (ref 0–0.7)
Eosinophils Relative: 1 %
HCT: 50.3 % (ref 40.0–52.0)
Hemoglobin: 15.9 g/dL (ref 13.0–18.0)
Lymphocytes Relative: 25 %
Lymphs Abs: 2.2 10*3/uL (ref 1.0–3.6)
MCH: 29.1 pg (ref 26.0–34.0)
MCHC: 31.7 g/dL — ABNORMAL LOW (ref 32.0–36.0)
MCV: 91.9 fL (ref 80.0–100.0)
Monocytes Absolute: 1.2 10*3/uL — ABNORMAL HIGH (ref 0.2–1.0)
Monocytes Relative: 14 %
Neutro Abs: 5 10*3/uL (ref 1.4–6.5)
Neutrophils Relative %: 59 %
Platelets: 100 10*3/uL — ABNORMAL LOW (ref 150–440)
RBC: 5.47 MIL/uL (ref 4.40–5.90)
RDW: 16.3 % — ABNORMAL HIGH (ref 11.5–14.5)
WBC: 8.5 10*3/uL (ref 3.8–10.6)

## 2015-02-22 MED ORDER — CYANOCOBALAMIN 1000 MCG/ML IJ SOLN
1000.0000 ug | Freq: Once | INTRAMUSCULAR | Status: AC
  Administered 2015-02-22: 1000 ug via INTRAMUSCULAR
  Filled 2015-02-22: qty 1

## 2015-02-27 ENCOUNTER — Other Ambulatory Visit: Payer: Self-pay | Admitting: Hematology and Oncology

## 2015-02-27 DIAGNOSIS — D751 Secondary polycythemia: Secondary | ICD-10-CM

## 2015-03-01 ENCOUNTER — Inpatient Hospital Stay: Payer: Medicare PPO

## 2015-03-01 VITALS — BP 138/72 | HR 98 | Temp 98.3°F

## 2015-03-01 DIAGNOSIS — E538 Deficiency of other specified B group vitamins: Secondary | ICD-10-CM

## 2015-03-01 MED ORDER — CYANOCOBALAMIN 1000 MCG/ML IJ SOLN
1000.0000 ug | Freq: Once | INTRAMUSCULAR | Status: AC
  Administered 2015-03-01: 1000 ug via INTRAMUSCULAR
  Filled 2015-03-01: qty 1

## 2015-03-02 ENCOUNTER — Other Ambulatory Visit: Payer: Self-pay

## 2015-03-02 DIAGNOSIS — D751 Secondary polycythemia: Secondary | ICD-10-CM

## 2015-03-08 ENCOUNTER — Inpatient Hospital Stay: Payer: Medicare PPO

## 2015-03-08 ENCOUNTER — Other Ambulatory Visit: Payer: Self-pay | Admitting: Hematology and Oncology

## 2015-03-08 DIAGNOSIS — E538 Deficiency of other specified B group vitamins: Secondary | ICD-10-CM | POA: Diagnosis not present

## 2015-03-08 MED ORDER — CYANOCOBALAMIN 1000 MCG/ML IJ SOLN
1000.0000 ug | Freq: Once | INTRAMUSCULAR | Status: AC
  Administered 2015-03-08: 1000 ug via INTRAMUSCULAR
  Filled 2015-03-08: qty 1

## 2015-03-25 ENCOUNTER — Other Ambulatory Visit: Payer: Self-pay | Admitting: *Deleted

## 2015-03-26 ENCOUNTER — Inpatient Hospital Stay: Payer: Medicare PPO

## 2015-03-26 ENCOUNTER — Other Ambulatory Visit: Payer: Self-pay | Admitting: Hematology and Oncology

## 2015-03-26 ENCOUNTER — Other Ambulatory Visit: Payer: Self-pay

## 2015-03-26 ENCOUNTER — Inpatient Hospital Stay: Payer: Medicare PPO | Attending: Hematology and Oncology | Admitting: Hematology and Oncology

## 2015-03-26 VITALS — BP 127/68 | HR 105 | Temp 96.5°F | Resp 18 | Ht 70.0 in | Wt 130.1 lb

## 2015-03-26 DIAGNOSIS — F17210 Nicotine dependence, cigarettes, uncomplicated: Secondary | ICD-10-CM | POA: Insufficient documentation

## 2015-03-26 DIAGNOSIS — D696 Thrombocytopenia, unspecified: Secondary | ICD-10-CM | POA: Diagnosis not present

## 2015-03-26 DIAGNOSIS — D751 Secondary polycythemia: Secondary | ICD-10-CM | POA: Insufficient documentation

## 2015-03-26 DIAGNOSIS — Z7982 Long term (current) use of aspirin: Secondary | ICD-10-CM | POA: Diagnosis not present

## 2015-03-26 DIAGNOSIS — E538 Deficiency of other specified B group vitamins: Secondary | ICD-10-CM | POA: Diagnosis not present

## 2015-03-26 DIAGNOSIS — Z809 Family history of malignant neoplasm, unspecified: Secondary | ICD-10-CM | POA: Insufficient documentation

## 2015-03-26 DIAGNOSIS — Z79899 Other long term (current) drug therapy: Secondary | ICD-10-CM

## 2015-03-26 DIAGNOSIS — Z9981 Dependence on supplemental oxygen: Secondary | ICD-10-CM | POA: Insufficient documentation

## 2015-03-26 DIAGNOSIS — R911 Solitary pulmonary nodule: Secondary | ICD-10-CM

## 2015-03-26 LAB — CBC WITH DIFFERENTIAL/PLATELET
Basophils Absolute: 0.1 10*3/uL (ref 0–0.1)
Basophils Relative: 1 %
Eosinophils Absolute: 0.1 10*3/uL (ref 0–0.7)
Eosinophils Relative: 1 %
HCT: 48.8 % (ref 40.0–52.0)
Hemoglobin: 15 g/dL (ref 13.0–18.0)
Lymphocytes Relative: 25 %
Lymphs Abs: 2.3 10*3/uL (ref 1.0–3.6)
MCH: 27.4 pg (ref 26.0–34.0)
MCHC: 30.8 g/dL — ABNORMAL LOW (ref 32.0–36.0)
MCV: 88.9 fL (ref 80.0–100.0)
Monocytes Absolute: 1.1 10*3/uL — ABNORMAL HIGH (ref 0.2–1.0)
Monocytes Relative: 12 %
Neutro Abs: 5.7 10*3/uL (ref 1.4–6.5)
Neutrophils Relative %: 61 %
Platelets: 95 10*3/uL — ABNORMAL LOW (ref 150–440)
RBC: 5.48 MIL/uL (ref 4.40–5.90)
RDW: 17.1 % — ABNORMAL HIGH (ref 11.5–14.5)
WBC: 9.4 10*3/uL (ref 3.8–10.6)

## 2015-03-26 LAB — IRON AND TIBC
Iron: 55 ug/dL (ref 45–182)
Saturation Ratios: 13 % — ABNORMAL LOW (ref 17.9–39.5)
TIBC: 434 ug/dL (ref 250–450)
UIBC: 379 ug/dL

## 2015-03-26 LAB — FERRITIN: Ferritin: 19 ng/mL — ABNORMAL LOW (ref 24–336)
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
## 2015-03-27 ENCOUNTER — Other Ambulatory Visit: Payer: Self-pay | Admitting: Hematology and Oncology

## 2015-03-29 ENCOUNTER — Ambulatory Visit: Payer: Medicare PPO

## 2015-03-29 ENCOUNTER — Inpatient Hospital Stay: Payer: Medicare PPO

## 2015-03-29 DIAGNOSIS — D751 Secondary polycythemia: Secondary | ICD-10-CM | POA: Diagnosis not present

## 2015-03-29 DIAGNOSIS — E538 Deficiency of other specified B group vitamins: Secondary | ICD-10-CM

## 2015-03-29 MED ORDER — CYANOCOBALAMIN 1000 MCG/ML IJ SOLN
1000.0000 ug | Freq: Once | INTRAMUSCULAR | Status: AC
Start: 2015-03-29 — End: 2015-03-29
  Administered 2015-03-29: 1000 ug via INTRAMUSCULAR
  Filled 2015-03-29: qty 1

## 2015-04-26 ENCOUNTER — Other Ambulatory Visit: Payer: Medicare PPO

## 2015-04-26 ENCOUNTER — Ambulatory Visit: Payer: Medicare PPO

## 2015-04-26 ENCOUNTER — Inpatient Hospital Stay: Payer: Medicare PPO

## 2015-04-26 ENCOUNTER — Inpatient Hospital Stay: Payer: Medicare PPO | Attending: Hematology and Oncology

## 2015-04-26 DIAGNOSIS — E538 Deficiency of other specified B group vitamins: Secondary | ICD-10-CM | POA: Insufficient documentation

## 2015-04-26 DIAGNOSIS — D751 Secondary polycythemia: Secondary | ICD-10-CM

## 2015-04-26 DIAGNOSIS — Z79899 Other long term (current) drug therapy: Secondary | ICD-10-CM | POA: Diagnosis not present

## 2015-04-26 LAB — CBC WITH DIFFERENTIAL/PLATELET
Basophils Absolute: 0.1 10*3/uL (ref 0–0.1)
Basophils Relative: 1 %
Eosinophils Absolute: 0.1 10*3/uL (ref 0–0.7)
Eosinophils Relative: 1 %
HCT: 49.9 % (ref 40.0–52.0)
Hemoglobin: 15.7 g/dL (ref 13.0–18.0)
Lymphocytes Relative: 25 %
Lymphs Abs: 2.2 10*3/uL (ref 1.0–3.6)
MCH: 26.6 pg (ref 26.0–34.0)
MCHC: 31.4 g/dL — ABNORMAL LOW (ref 32.0–36.0)
MCV: 84.6 fL (ref 80.0–100.0)
Monocytes Absolute: 1.2 10*3/uL — ABNORMAL HIGH (ref 0.2–1.0)
Monocytes Relative: 14 %
Neutro Abs: 5.3 10*3/uL (ref 1.4–6.5)
Neutrophils Relative %: 59 %
Platelets: 94 10*3/uL — ABNORMAL LOW (ref 150–440)
RBC: 5.89 MIL/uL (ref 4.40–5.90)
RDW: 17.8 % — ABNORMAL HIGH (ref 11.5–14.5)
WBC: 8.9 10*3/uL (ref 3.8–10.6)

## 2015-04-26 MED ORDER — CYANOCOBALAMIN 1000 MCG/ML IJ SOLN
1000.0000 ug | Freq: Once | INTRAMUSCULAR | Status: AC
  Administered 2015-04-26: 1000 ug via INTRAMUSCULAR
  Filled 2015-04-26: qty 1

## 2015-05-01 DIAGNOSIS — I10 Essential (primary) hypertension: Secondary | ICD-10-CM | POA: Diagnosis not present

## 2015-05-18 ENCOUNTER — Telehealth: Payer: Self-pay | Admitting: Family Medicine

## 2015-05-18 MED ORDER — AZITHROMYCIN 250 MG PO TABS: ORAL_TABLET | ORAL | Status: DC

## 2015-05-18 NOTE — Telephone Encounter (Signed)
Pt called wants to know if something can be sent to the pharmacy for his sinuses. Pt informed he would most likely need an appt. Pt stated he has an appt in April. Pharm is [facility] in [place]. Thanks.

## 2015-05-20 ENCOUNTER — Encounter: Payer: Self-pay | Admitting: Hematology and Oncology

## 2015-05-21 ENCOUNTER — Telehealth: Payer: Self-pay

## 2015-05-21 NOTE — Telephone Encounter (Signed)
disregard

## 2015-05-24 ENCOUNTER — Other Ambulatory Visit: Payer: Self-pay | Admitting: Hematology and Oncology

## 2015-05-24 ENCOUNTER — Ambulatory Visit: Payer: Medicare PPO

## 2015-05-24 ENCOUNTER — Inpatient Hospital Stay: Payer: Commercial Managed Care - HMO | Attending: Hematology and Oncology

## 2015-05-24 DIAGNOSIS — E538 Deficiency of other specified B group vitamins: Secondary | ICD-10-CM | POA: Insufficient documentation

## 2015-05-24 DIAGNOSIS — Z79899 Other long term (current) drug therapy: Secondary | ICD-10-CM | POA: Insufficient documentation

## 2015-05-27 ENCOUNTER — Inpatient Hospital Stay: Payer: Commercial Managed Care - HMO

## 2015-05-27 VITALS — BP 126/71 | HR 98 | Temp 98.1°F | Resp 18

## 2015-05-27 DIAGNOSIS — E538 Deficiency of other specified B group vitamins: Secondary | ICD-10-CM | POA: Diagnosis not present

## 2015-05-27 DIAGNOSIS — Z79899 Other long term (current) drug therapy: Secondary | ICD-10-CM | POA: Diagnosis not present

## 2015-05-27 MED ORDER — CYANOCOBALAMIN 1000 MCG/ML IJ SOLN
1000.0000 ug | Freq: Once | INTRAMUSCULAR | Status: AC
  Administered 2015-05-27: 1000 ug via INTRAMUSCULAR
  Filled 2015-05-27: qty 1

## 2015-06-01 DIAGNOSIS — I10 Essential (primary) hypertension: Secondary | ICD-10-CM | POA: Diagnosis not present

## 2015-06-06 ENCOUNTER — Emergency Department
Admission: EM | Admit: 2015-06-06 | Discharge: 2015-06-06 | Disposition: A | Discharge status: Home / Self Care | Payer: No Typology Code available for payment source | Attending: Emergency Medicine | Admitting: Emergency Medicine

## 2015-06-06 ENCOUNTER — Encounter: Payer: Self-pay | Admitting: Emergency Medicine

## 2015-06-06 DIAGNOSIS — I10 Essential (primary) hypertension: Secondary | ICD-10-CM | POA: Insufficient documentation

## 2015-06-06 DIAGNOSIS — Z79899 Other long term (current) drug therapy: Secondary | ICD-10-CM | POA: Insufficient documentation

## 2015-06-06 DIAGNOSIS — S199XXA Unspecified injury of neck, initial encounter: Secondary | ICD-10-CM | POA: Diagnosis present

## 2015-06-06 DIAGNOSIS — F17210 Nicotine dependence, cigarettes, uncomplicated: Secondary | ICD-10-CM | POA: Diagnosis not present

## 2015-06-06 DIAGNOSIS — Z792 Long term (current) use of antibiotics: Secondary | ICD-10-CM | POA: Insufficient documentation

## 2015-06-06 DIAGNOSIS — Y998 Other external cause status: Secondary | ICD-10-CM | POA: Diagnosis not present

## 2015-06-06 DIAGNOSIS — Z7982 Long term (current) use of aspirin: Secondary | ICD-10-CM | POA: Insufficient documentation

## 2015-06-06 DIAGNOSIS — S134XXA Sprain of ligaments of cervical spine, initial encounter: Secondary | ICD-10-CM

## 2015-06-06 DIAGNOSIS — Y9389 Activity, other specified: Secondary | ICD-10-CM | POA: Insufficient documentation

## 2015-06-06 DIAGNOSIS — Y92410 Unspecified street and highway as the place of occurrence of the external cause: Secondary | ICD-10-CM | POA: Insufficient documentation

## 2015-06-06 HISTORY — DX: Chronic obstructive pulmonary disease, unspecified: J44.9
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
## 2015-06-06 NOTE — ED Notes (Signed)
AAOx3.  Skin warm and dry.  Ambulates with easy and steady gait.  MAE equally and strong.
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
## 2015-06-06 NOTE — ED Notes (Signed)
Pt's sp02 low - states always low and uses oxygen at home - pt placed on portable oxygen

## 2015-06-20 ENCOUNTER — Other Ambulatory Visit: Payer: Self-pay | Admitting: Hematology and Oncology

## 2015-06-20 DIAGNOSIS — D751 Secondary polycythemia: Secondary | ICD-10-CM

## 2015-06-21 ENCOUNTER — Inpatient Hospital Stay: Payer: Commercial Managed Care - HMO | Attending: Hematology and Oncology

## 2015-06-21 ENCOUNTER — Ambulatory Visit: Payer: Medicare PPO

## 2015-06-21 ENCOUNTER — Inpatient Hospital Stay: Payer: Commercial Managed Care - HMO

## 2015-06-21 DIAGNOSIS — F17210 Nicotine dependence, cigarettes, uncomplicated: Secondary | ICD-10-CM | POA: Insufficient documentation

## 2015-06-21 DIAGNOSIS — D751 Secondary polycythemia: Secondary | ICD-10-CM | POA: Insufficient documentation

## 2015-06-21 DIAGNOSIS — J449 Chronic obstructive pulmonary disease, unspecified: Secondary | ICD-10-CM | POA: Diagnosis not present

## 2015-06-21 DIAGNOSIS — Z7982 Long term (current) use of aspirin: Secondary | ICD-10-CM | POA: Diagnosis not present

## 2015-06-21 DIAGNOSIS — E538 Deficiency of other specified B group vitamins: Secondary | ICD-10-CM

## 2015-06-21 DIAGNOSIS — D696 Thrombocytopenia, unspecified: Secondary | ICD-10-CM | POA: Diagnosis not present

## 2015-06-21 DIAGNOSIS — R05 Cough: Secondary | ICD-10-CM | POA: Diagnosis not present

## 2015-06-21 DIAGNOSIS — J439 Emphysema, unspecified: Secondary | ICD-10-CM | POA: Insufficient documentation

## 2015-06-21 DIAGNOSIS — I2510 Atherosclerotic heart disease of native coronary artery without angina pectoris: Secondary | ICD-10-CM | POA: Diagnosis not present

## 2015-06-21 DIAGNOSIS — M542 Cervicalgia: Secondary | ICD-10-CM | POA: Diagnosis not present

## 2015-06-21 DIAGNOSIS — Z79899 Other long term (current) drug therapy: Secondary | ICD-10-CM | POA: Insufficient documentation

## 2015-06-21 DIAGNOSIS — Z9981 Dependence on supplemental oxygen: Secondary | ICD-10-CM | POA: Diagnosis not present

## 2015-06-21 DIAGNOSIS — I10 Essential (primary) hypertension: Secondary | ICD-10-CM | POA: Diagnosis not present

## 2015-06-21 LAB — CBC WITH DIFFERENTIAL/PLATELET
Basophils Absolute: 0.1 10*3/uL (ref 0–0.1)
Basophils Relative: 1 %
Eosinophils Absolute: 0.1 10*3/uL (ref 0–0.7)
Eosinophils Relative: 1 %
HCT: 51.4 % (ref 40.0–52.0)
Hemoglobin: 16.2 g/dL (ref 13.0–18.0)
Lymphocytes Relative: 26 %
Lymphs Abs: 2 10*3/uL (ref 1.0–3.6)
MCH: 26.3 pg (ref 26.0–34.0)
MCHC: 31.6 g/dL — ABNORMAL LOW (ref 32.0–36.0)
MCV: 83.3 fL (ref 80.0–100.0)
Monocytes Absolute: 1.2 10*3/uL — ABNORMAL HIGH (ref 0.2–1.0)
Monocytes Relative: 15 %
Neutro Abs: 4.6 10*3/uL (ref 1.4–6.5)
Neutrophils Relative %: 57 %
Platelets: 79 10*3/uL — ABNORMAL LOW (ref 150–440)
RBC: 6.17 MIL/uL — ABNORMAL HIGH (ref 4.40–5.90)
RDW: 19.7 % — ABNORMAL HIGH (ref 11.5–14.5)
WBC: 8 10*3/uL (ref 3.8–10.6)

## 2015-06-21 MED ORDER — CYANOCOBALAMIN 1000 MCG/ML IJ SOLN
1000.0000 ug | Freq: Once | INTRAMUSCULAR | Status: AC
  Administered 2015-06-21: 1000 ug via INTRAMUSCULAR
  Filled 2015-06-21: qty 1

## 2015-06-23 ENCOUNTER — Ambulatory Visit
Admission: RE | Admit: 2015-06-23 | Discharge: 2015-06-23 | Disposition: A | Discharge status: Home / Self Care | Payer: Commercial Managed Care - HMO | Source: Ambulatory Visit | Attending: Hematology and Oncology | Admitting: Hematology and Oncology

## 2015-06-23 DIAGNOSIS — I7090 Unspecified atherosclerosis: Secondary | ICD-10-CM | POA: Diagnosis not present

## 2015-06-23 DIAGNOSIS — I289 Disease of pulmonary vessels, unspecified: Secondary | ICD-10-CM | POA: Diagnosis not present

## 2015-06-23 DIAGNOSIS — I2510 Atherosclerotic heart disease of native coronary artery without angina pectoris: Secondary | ICD-10-CM | POA: Diagnosis not present

## 2015-06-23 DIAGNOSIS — J438 Other emphysema: Secondary | ICD-10-CM | POA: Diagnosis not present

## 2015-06-23 DIAGNOSIS — R911 Solitary pulmonary nodule: Secondary | ICD-10-CM | POA: Insufficient documentation

## 2015-06-23 DIAGNOSIS — J439 Emphysema, unspecified: Secondary | ICD-10-CM | POA: Diagnosis not present

## 2015-06-25 DIAGNOSIS — H5210 Myopia, unspecified eye: Secondary | ICD-10-CM | POA: Diagnosis not present

## 2015-06-25 DIAGNOSIS — H524 Presbyopia: Secondary | ICD-10-CM | POA: Diagnosis not present

## 2015-06-28 ENCOUNTER — Telehealth: Payer: Self-pay

## 2015-06-28 ENCOUNTER — Ambulatory Visit (INDEPENDENT_AMBULATORY_CARE_PROVIDER_SITE_OTHER): Payer: Commercial Managed Care - HMO | Admitting: Family Medicine

## 2015-06-28 ENCOUNTER — Encounter: Payer: Self-pay | Admitting: Family Medicine

## 2015-06-28 VITALS — BP 142/66 | HR 102 | Temp 98.7°F | Ht 69.2 in | Wt 130.0 lb

## 2015-06-28 DIAGNOSIS — I10 Essential (primary) hypertension: Secondary | ICD-10-CM

## 2015-06-28 DIAGNOSIS — H269 Unspecified cataract: Secondary | ICD-10-CM | POA: Diagnosis not present

## 2015-06-28 DIAGNOSIS — J410 Simple chronic bronchitis: Secondary | ICD-10-CM | POA: Diagnosis not present

## 2015-06-28 NOTE — Telephone Encounter (Signed)
Returned pt's phone call regarding CT of chest.  Pt concerned about results. In speaking with pt I reminded pt that we would see him Tuesday the 28th to review with Dr. Mike Gip his results.  Pt verbalized an understanding and no other concerns were noted.

## 2015-06-28 NOTE — Assessment & Plan Note (Signed)
The current medical regimen is effective;  continue present plan and medications.  

## 2015-06-28 NOTE — Progress Notes (Signed)
BP 142/66 mmHg  Pulse 102  Temp(Src) 98.7 F (37.1 C)  Ht 5' 9.2" (1.758 m)  Wt 130 lb (58.968 kg)  BMI 19.08 kg/m2   Subjective:    Patient ID: Patrick Ayala, male    DOB: [birth year], 75 y.o.   MRN: [id number]  HPI: Patrick Ayala is a 75 y.o. male  Chief Complaint  Patient presents with  . referral to Eye Doctor    cataracts   Patient with cataracts both eyes right eye is worse wants to see eye doctor Follow up as been creeping up on and gradually getting worse now ready to have something done about it \Blood pressures been doing well no complaints from amlodipine Lipitor no problems Symbicort doing well with no problems or concerns COPD stable  Relevant past medical, surgical, family and social history reviewed and updated as indicated. Interim medical history since our last visit reviewed. Allergies and medications reviewed and updated.  Review of Systems  Constitutional: Negative.   Respiratory: Negative.   Cardiovascular: Negative.     Per HPI unless specifically indicated above     Objective:    BP 142/66 mmHg  Pulse 102  Temp(Src) 98.7 F (37.1 C)  Ht 5' 9.2" (1.758 m)  Wt 130 lb (58.968 kg)  BMI 19.08 kg/m2  Wt Readings from Last 3 Encounters:  06/28/15 130 lb (58.968 kg)  06/06/15 124 lb (56.246 kg)  03/26/15 130 lb 1.1 oz (59 kg)    Physical Exam  Constitutional: He is oriented to person, place, and time. He appears well-developed and well-nourished. No distress.  HENT:  Head: Normocephalic and atraumatic.  Right Ear: Hearing normal.  Left Ear: Hearing normal.  Nose: Nose normal.  Eyes: Conjunctivae and lids are normal. Right eye exhibits no discharge. Left eye exhibits no discharge. No scleral icterus.  Cardiovascular: Normal rate, regular rhythm and normal heart sounds.   Pulmonary/Chest: Effort normal. No respiratory distress.  Poor air movement  Musculoskeletal: Normal range of motion.  Neurological: He is alert and oriented to  person, place, and time.  Skin: Skin is intact. No rash noted.  Psychiatric: He has a normal mood and affect. His speech is normal and behavior is normal. Judgment and thought content normal. Cognition and memory are normal.    Results for orders placed or performed in visit on 06/21/15  CBC with Differential  Result Value Ref Range   WBC 8.0 3.8 - 10.6 K/uL   RBC 6.17 (H) 4.40 - 5.90 MIL/uL   Hemoglobin 16.2 13.0 - 18.0 g/dL   HCT 51.4 40.0 - 52.0 %   MCV 83.3 80.0 - 100.0 fL   MCH 26.3 26.0 - 34.0 pg   MCHC 31.6 (L) 32.0 - 36.0 g/dL   RDW 19.7 (H) 11.5 - 14.5 %   Platelets 79 (L) 150 - 440 K/uL   Neutrophils Relative % 57% %   Neutro Abs 4.6 1.4 - 6.5 K/uL   Lymphocytes Relative 26% %   Lymphs Abs 2.0 1.0 - 3.6 K/uL   Monocytes Relative 15% %   Monocytes Absolute 1.2 (H) 0.2 - 1.0 K/uL   Eosinophils Relative 1% %   Eosinophils Absolute 0.1 0 - 0.7 K/uL   Basophils Relative 1% %   Basophils Absolute 0.1 0 - 0.1 K/uL      Assessment & Plan:   Problem List Items Addressed This Visit      Cardiovascular and Mediastinum   Essential hypertension    The current medical  regimen is effective;  continue present plan and medications.         Respiratory   COPD (chronic obstructive pulmonary disease) (HCC)    The current medical regimen is effective;  continue present plan and medications.         Other   Cataracts, bilateral - Primary   Relevant Orders   Ambulatory referral to Ophthalmology       Follow up plan: Return for As scheduled, Physical Exam.

## 2015-06-29 DIAGNOSIS — H353130 Nonexudative age-related macular degeneration, bilateral, stage unspecified: Secondary | ICD-10-CM | POA: Diagnosis not present

## 2015-06-29 DIAGNOSIS — I10 Essential (primary) hypertension: Secondary | ICD-10-CM | POA: Diagnosis not present

## 2015-06-29 DIAGNOSIS — H35033 Hypertensive retinopathy, bilateral: Secondary | ICD-10-CM | POA: Diagnosis not present

## 2015-06-29 DIAGNOSIS — H25813 Combined forms of age-related cataract, bilateral: Secondary | ICD-10-CM | POA: Diagnosis not present

## 2015-07-05 DIAGNOSIS — H2511 Age-related nuclear cataract, right eye: Secondary | ICD-10-CM | POA: Diagnosis not present

## 2015-07-05 DIAGNOSIS — H25811 Combined forms of age-related cataract, right eye: Secondary | ICD-10-CM | POA: Diagnosis not present

## 2015-07-06 ENCOUNTER — Ambulatory Visit: Payer: Commercial Managed Care - HMO | Admitting: Hematology and Oncology

## 2015-07-08 ENCOUNTER — Encounter: Payer: Self-pay | Admitting: Hematology and Oncology

## 2015-07-08 ENCOUNTER — Inpatient Hospital Stay (HOSPITAL_BASED_OUTPATIENT_CLINIC_OR_DEPARTMENT_OTHER): Payer: Commercial Managed Care - HMO | Admitting: Hematology and Oncology

## 2015-07-08 VITALS — BP 159/74 | HR 106 | Temp 96.3°F | Resp 18 | Ht 69.25 in | Wt 134.2 lb

## 2015-07-08 DIAGNOSIS — D696 Thrombocytopenia, unspecified: Secondary | ICD-10-CM | POA: Diagnosis not present

## 2015-07-08 DIAGNOSIS — J439 Emphysema, unspecified: Secondary | ICD-10-CM

## 2015-07-08 DIAGNOSIS — Z9981 Dependence on supplemental oxygen: Secondary | ICD-10-CM

## 2015-07-08 DIAGNOSIS — D751 Secondary polycythemia: Secondary | ICD-10-CM

## 2015-07-08 DIAGNOSIS — J449 Chronic obstructive pulmonary disease, unspecified: Secondary | ICD-10-CM

## 2015-07-08 DIAGNOSIS — R05 Cough: Secondary | ICD-10-CM

## 2015-07-08 DIAGNOSIS — F17210 Nicotine dependence, cigarettes, uncomplicated: Secondary | ICD-10-CM | POA: Diagnosis not present

## 2015-07-08 DIAGNOSIS — E538 Deficiency of other specified B group vitamins: Secondary | ICD-10-CM

## 2015-07-08 DIAGNOSIS — Z87891 Personal history of nicotine dependence: Secondary | ICD-10-CM

## 2015-07-08 DIAGNOSIS — Z79899 Other long term (current) drug therapy: Secondary | ICD-10-CM

## 2015-07-08 DIAGNOSIS — M542 Cervicalgia: Secondary | ICD-10-CM

## 2015-07-08 DIAGNOSIS — I2510 Atherosclerotic heart disease of native coronary artery without angina pectoris: Secondary | ICD-10-CM

## 2015-07-08 DIAGNOSIS — Z7982 Long term (current) use of aspirin: Secondary | ICD-10-CM

## 2015-07-08 DIAGNOSIS — I10 Essential (primary) hypertension: Secondary | ICD-10-CM

## 2015-07-08 NOTE — Progress Notes (Signed)
Pt has a neck pain he reports he thinks is his pillow and can last all day.  Has a cough and coughs up thick clear mucos
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
## 2015-07-19 ENCOUNTER — Inpatient Hospital Stay: Payer: Commercial Managed Care - HMO | Attending: Hematology and Oncology

## 2015-07-19 ENCOUNTER — Ambulatory Visit: Payer: Medicare PPO

## 2015-07-19 ENCOUNTER — Inpatient Hospital Stay: Payer: Commercial Managed Care - HMO

## 2015-07-19 ENCOUNTER — Encounter: Payer: Self-pay | Admitting: Family Medicine

## 2015-07-19 ENCOUNTER — Inpatient Hospital Stay (HOSPITAL_BASED_OUTPATIENT_CLINIC_OR_DEPARTMENT_OTHER): Payer: Commercial Managed Care - HMO | Admitting: Hematology and Oncology

## 2015-07-19 ENCOUNTER — Ambulatory Visit (INDEPENDENT_AMBULATORY_CARE_PROVIDER_SITE_OTHER): Payer: Commercial Managed Care - HMO | Admitting: Family Medicine

## 2015-07-19 VITALS — BP 143/74 | HR 105 | Temp 97.0°F | Resp 18 | Wt 130.7 lb

## 2015-07-19 VITALS — BP 144/68 | HR 88 | Temp 97.6°F | Ht 69.7 in | Wt 129.0 lb

## 2015-07-19 DIAGNOSIS — Z809 Family history of malignant neoplasm, unspecified: Secondary | ICD-10-CM

## 2015-07-19 DIAGNOSIS — J449 Chronic obstructive pulmonary disease, unspecified: Secondary | ICD-10-CM | POA: Diagnosis not present

## 2015-07-19 DIAGNOSIS — E538 Deficiency of other specified B group vitamins: Secondary | ICD-10-CM | POA: Insufficient documentation

## 2015-07-19 DIAGNOSIS — D696 Thrombocytopenia, unspecified: Secondary | ICD-10-CM

## 2015-07-19 DIAGNOSIS — I10 Essential (primary) hypertension: Secondary | ICD-10-CM | POA: Diagnosis not present

## 2015-07-19 DIAGNOSIS — Z7982 Long term (current) use of aspirin: Secondary | ICD-10-CM

## 2015-07-19 DIAGNOSIS — R918 Other nonspecific abnormal finding of lung field: Secondary | ICD-10-CM | POA: Diagnosis not present

## 2015-07-19 DIAGNOSIS — F17210 Nicotine dependence, cigarettes, uncomplicated: Secondary | ICD-10-CM

## 2015-07-19 DIAGNOSIS — D751 Secondary polycythemia: Secondary | ICD-10-CM

## 2015-07-19 DIAGNOSIS — E785 Hyperlipidemia, unspecified: Secondary | ICD-10-CM

## 2015-07-19 DIAGNOSIS — N400 Enlarged prostate without lower urinary tract symptoms: Secondary | ICD-10-CM | POA: Insufficient documentation

## 2015-07-19 DIAGNOSIS — J410 Simple chronic bronchitis: Secondary | ICD-10-CM

## 2015-07-19 DIAGNOSIS — Z79899 Other long term (current) drug therapy: Secondary | ICD-10-CM | POA: Diagnosis not present

## 2015-07-19 DIAGNOSIS — R634 Abnormal weight loss: Secondary | ICD-10-CM

## 2015-07-19 DIAGNOSIS — Z0000 Encounter for general adult medical examination without abnormal findings: Secondary | ICD-10-CM | POA: Diagnosis not present

## 2015-07-19 LAB — MICROSCOPIC EXAMINATION

## 2015-07-19 LAB — URINALYSIS, ROUTINE W REFLEX MICROSCOPIC
BILIRUBIN UA: NEGATIVE
Glucose, UA: NEGATIVE
Ketones, UA: NEGATIVE
LEUKOCYTES UA: NEGATIVE
Nitrite, UA: NEGATIVE
PH UA: 7 (ref 5.0–7.5)
RBC UA: NEGATIVE
Specific Gravity, UA: 1.02 (ref 1.005–1.030)
Urobilinogen, Ur: 1 mg/dL (ref 0.2–1.0)

## 2015-07-19 LAB — CBC WITH DIFFERENTIAL/PLATELET
Basophils Absolute: 0.1 10*3/uL (ref 0–0.1)
Basophils Relative: 1 %
Eosinophils Absolute: 0.1 10*3/uL (ref 0–0.7)
Eosinophils Relative: 1 %
HCT: 51.7 % (ref 40.0–52.0)
Hemoglobin: 16.2 g/dL (ref 13.0–18.0)
Lymphocytes Relative: 25 %
Lymphs Abs: 2.2 10*3/uL (ref 1.0–3.6)
MCH: 26 pg (ref 26.0–34.0)
MCHC: 31.3 g/dL — ABNORMAL LOW (ref 32.0–36.0)
MCV: 83.1 fL (ref 80.0–100.0)
Monocytes Absolute: 1.2 10*3/uL — ABNORMAL HIGH (ref 0.2–1.0)
Monocytes Relative: 13 %
Neutro Abs: 5.1 10*3/uL (ref 1.4–6.5)
Neutrophils Relative %: 60 %
Platelets: 95 10*3/uL — ABNORMAL LOW (ref 150–440)
RBC: 6.22 MIL/uL — ABNORMAL HIGH (ref 4.40–5.90)
RDW: 20.3 % — ABNORMAL HIGH (ref 11.5–14.5)
WBC: 8.6 10*3/uL (ref 3.8–10.6)

## 2015-07-19 MED ORDER — ATORVASTATIN CALCIUM 20 MG PO TABS: 20.0000 mg | ORAL_TABLET | Freq: Every day | ORAL | Status: DC

## 2015-07-19 MED ORDER — CYANOCOBALAMIN 1000 MCG/ML IJ SOLN
1000.0000 ug | Freq: Once | INTRAMUSCULAR | Status: AC
  Administered 2015-07-19: 1000 ug via INTRAMUSCULAR
  Filled 2015-07-19: qty 1

## 2015-07-19 MED ORDER — BUDESONIDE-FORMOTEROL FUMARATE 160-4.5 MCG/ACT IN AERO: INHALATION_SPRAY | RESPIRATORY_TRACT | Status: DC

## 2015-07-19 MED ORDER — AMLODIPINE BESYLATE 5 MG PO TABS: 5.0000 mg | ORAL_TABLET | Freq: Every day | ORAL | Status: DC

## 2015-07-19 NOTE — Progress Notes (Signed)
[facility] day:  07/19/2015  Chief Complaint: Patrick Ayala is a 75 y.o. male with secondary polycythemia and thrombocytopenia and B12 deficiency who is seen for B12 injection and possible phlebotomy.  HPI: The patient was last seen in the medical oncology clinic on 07/08/2015.  At that time, he was seen for review of interval chest CT and 3 month assessment.  Chest CT revealed resolution of the left upper lobe pulmonary nodule.  CBC revealed a hematocrit of 51.4, hemoglobin 16.2, MCV 83.3, and platelets 79,000.  He states that he had cataract surgery (unclear date).  He needs to have several teeth extracted.  He is unclear of the timing.  He denies any chest pain or shortness of breath.  He denies any headache or visual changes.  He denies any bruising or bleeding.   Past Medical History  Diagnosis Date  . Carboxyhemoglobinemia   . Thrombocytopenia ([place])   . Polycythemia, secondary 10/20/2014  . COPD (chronic obstructive pulmonary disease) [facility])     Past Surgical History  Procedure Laterality Date  . Polycythemia    . Hernia repair    . Appendectomy    . Eye surgery Right     cataract    Family History  Problem Relation Age of Onset  . Cancer Mother   . Cancer Sister   . Cancer Brother     Social History:  reports that he has been smoking Cigarettes.  He has been smoking about 1.00 pack per day. He has never used smokeless tobacco. He reports that he drinks alcohol. He reports that he does not use illicit drugs.  He smokes 1 pack per day.  He drinks 2 beers per night.  He lives in [place].  He is alone today.  Allergies:  Allergies  Allergen Reactions  . No Known Allergies     Current Medications: Current Outpatient Prescriptions  Medication Sig Dispense Refill  . amLODipine (NORVASC) 5 MG tablet Take 1 tablet (5 mg total) by mouth daily. 90 tablet 4  . aspirin 81 MG tablet ASA 81 mg po once a day (start taking it  from 08/02/2013)   Quantity: 0;  Refills: 0  Active    . atorvastatin (LIPITOR) 20 MG tablet Take 1 tablet (20 mg total) by mouth daily at 6 PM. 90 tablet 4  . budesonide-formoterol (SYMBICORT) 160-4.5 MCG/ACT inhaler INHALE 2 TIMES DAILY 30.6 Inhaler 12  . ketorolac (ACULAR) 0.5 % ophthalmic solution     . prednisoLONE acetate (PRED FORTE) 1 % ophthalmic suspension      No current facility-administered medications for this visit.    Review of Systems:  GENERAL:  Feels "ok".  No fevers or sweats.  Weight stable. PERFORMANCE STATUS (ECOG):  1 HEENT:  Cataract surgery.  Needs dental extractions.  No visual changes, runny nose, sore throat, mouth sores or tenderness. Lungs: Shortness of breath on exertion.  Chronic cough for years.  No hemoptysis. Cardiac:  No chest pain, palpitations, orthopnea, or PND. GI:  No nausea, vomiting, diarrhea, constipation, melena or hematochezia. GU:  No urgency, frequency, dysuria, or hematuria. Musculoskeletal:  Neck pain.  No back pain.  No joint pain.  No muscle tenderness. Extremities:  No pain or swelling. Skin:  No rashes or skin changes. Neuro:  No headache, numbness or weakness, balance or coordination issues. Endocrine:  No diabetes, thyroid issues, hot flashes or night sweats. Psych:  No mood changes, depression or anxiety. Pain:  No  focal pain. Review of systems:  All other systems reviewed and found to be negative.  Physical Exam: Blood pressure 143/74, pulse 105, temperature 97 F (36.1 C), temperature source Tympanic, resp. rate 18, weight 130 lb 11.7 oz (59.3 kg). GENERAL:  Thin gentleman sitting comfortably in the exam room in no acute distress. MENTAL STATUS:  Alert and oriented to person, place and time. HEAD: Pearline Cables hair.  Mustache.  Normocephalic, atraumatic, face symmetric, no Cushingoid features. EYES:  Brown eyes.  Ruddy sclera.  Pupils equal round and reactive to light and accomodation.  No conjunctivitis or scleral icterus. ENT:   Oropharynx clear without lesion.  Missing teeth. Poor dentition.  Tongue normal. Mucous membranes moist.  RESPIRATORY:  Clear to auscultation without rales or rhonchi. CARDIOVASCULAR:  Regular rate and rhythm without murmur, rub or gallop. ABDOMEN:  Ventral hernia.  Soft, non-tender, with active bowel sounds, and no hepatosplenomegaly.  No masses. SKIN:  No rashes, ulcers or lesions. EXTREMITIES: No edema, no skin discoloration or tenderness.  No palpable cords. LYMPH NODES: No palpable cervical, supraclavicular, axillary or inguinal adenopathy  NEUROLOGICAL: Unremarkable. PSYCH:  Appropriate.  Appointment on 07/19/2015  Component Date Value Ref Range Status  . WBC 07/19/2015 8.6  3.8 - 10.6 K/uL Final  . RBC 07/19/2015 6.22* 4.40 - 5.90 MIL/uL Final  . Hemoglobin 07/19/2015 16.2  13.0 - 18.0 g/dL Final   RESULT REPEATED AND VERIFIED  . HCT 07/19/2015 51.7  40.0 - 52.0 % Final   RESULT REPEATED AND VERIFIED  . MCV 07/19/2015 83.1  80.0 - 100.0 fL Final  . MCH 07/19/2015 26.0  26.0 - 34.0 pg Final  . MCHC 07/19/2015 31.3* 32.0 - 36.0 g/dL Final  . RDW 07/19/2015 20.3* 11.5 - 14.5 % Final  . Platelets 07/19/2015 95* 150 - 440 K/uL Final  . Neutrophils Relative % 07/19/2015 60%   Final  . Neutro Abs 07/19/2015 5.1  1.4 - 6.5 K/uL Final  . Lymphocytes Relative 07/19/2015 25%   Final  . Lymphs Abs 07/19/2015 2.2  1.0 - 3.6 K/uL Final  . Monocytes Relative 07/19/2015 13%   Final  . Monocytes Absolute 07/19/2015 1.2* 0.2 - 1.0 K/uL Final  . Eosinophils Relative 07/19/2015 1%   Final  . Eosinophils Absolute 07/19/2015 0.1  0 - 0.7 K/uL Final  . Basophils Relative 07/19/2015 1%   Final  . Basophils Absolute 07/19/2015 0.1  0 - 0.1 K/uL Final  Office Visit on 07/19/2015  Component Date Value Ref Range Status  . Specific Gravity, UA 07/19/2015 1.020  1.005 - 1.030 Final  . pH, UA 07/19/2015 7.0  5.0 - 7.5 Final  . Color, UA 07/19/2015 Yellow  Yellow Final  . Appearance Ur 07/19/2015 Clear   Clear Final  . Leukocytes, UA 07/19/2015 Negative  Negative Final  . Protein, UA 07/19/2015 2+* Negative/Trace Final  . Glucose, UA 07/19/2015 Negative  Negative Final  . Ketones, UA 07/19/2015 Negative  Negative Final  . RBC, UA 07/19/2015 Negative  Negative Final  . Bilirubin, UA 07/19/2015 Negative  Negative Final  . Urobilinogen, Ur 07/19/2015 1.0  0.2 - 1.0 mg/dL Final  . Nitrite, UA 07/19/2015 Negative  Negative Final  . Microscopic Examination 07/19/2015 See below:   Final  . WBC, UA 07/19/2015 0-5  0 -  5 /hpf Final  . RBC, UA 07/19/2015 0-2  0 -  2 /hpf Final  . Epithelial Cells (non renal) 07/19/2015 0-10  0 - 10 /hpf Final  . Mucus, UA 07/19/2015 Present  Not Estab. Final  . Bacteria, UA 07/19/2015 Few  None seen/Few Final    Assessment:  Patrick Ayala is a 75 y.o. male with secondary polycythemia and thrombocytopenia of unclear etiology.  He smokes and has a history of an elevated carboxyhemoglobin as high as 23% attributed to smoking in a small unventilated room for many hours and a truck exhaust leak (fixed about a year ago).  He has carbon monoxide  detectors. He smokes 1 pack per day, but is trying to cut back.  Platelet count has ranged from 61,000 to 139,000 without trend since 09/2012.  Prior work-up noted the following normal labs:  erythropoietin level, iron studies, JAK2, HIV, ANA, and hepatitis C. Ultrasound revealed no splenomegaly.  Chest CT revealed resolution of the left upper lobe pulmonary nodule. Chest CT on 12/24/2014 for surveillance revealed a new 4 mm LUL nodule.  Chest CT on 06/23/2015 revealed resolution of the left upper lobe pulmonary nodule.   He has had multiple phlebotomies beginning in 09/2012. He undergoes small phlebotomies (250-300 cc) with supplimental IVF to maintain a hematocrit less than 52.  He has B12 deficiency.  B12 was 260 (low normal) on 12/07/2014.  MMA was elevated c/w B12 deficiency.  He was started on B12 weekly x 6 (02/01/2015 -  03/01/2015) prior to a monthly schedule (last 06/21/2015).  Symptomatically, he denies any new complaints. He is planning several dental extractions.  Exam is stable.  Hematocrit is 51.7.  Platelet count is 95,000.  Plan: 1. Labs today:  CBC with diff. 2. No phlebotomy today.  Discuss borderline hematocrit.  Anticipate phlebotomy next month. 3. Encourage smoking cessation. 4. B12 today and monthly. 5. Discuss obtaining card for dentist regarding coordination of care for dental extraction with thrombocytopenia. 6. RTC on 05/08 and 06/05 for CBC, B12 injection, and  +/- phlebotomy. 7. RTC on 10/11/2015 for MD assessment, labs (CBC with diff), B12 injection, and +/- phlebotomy.   Lequita Asal, MD  07/19/2015, 2:15 PM

## 2015-07-19 NOTE — Assessment & Plan Note (Signed)
The current medical regimen is effective;  continue present plan and medications.  

## 2015-07-19 NOTE — Assessment & Plan Note (Signed)
Followed by hematology in stable

## 2015-07-19 NOTE — Assessment & Plan Note (Signed)
Stable for now encouraged to quit smoking

## 2015-07-19 NOTE — Assessment & Plan Note (Signed)
Weight has remained stable recently

## 2015-07-19 NOTE — Assessment & Plan Note (Signed)
Getting B12 injections from oncology

## 2015-07-19 NOTE — Progress Notes (Signed)
BP 144/68 mmHg  Pulse 88  Temp(Src) 97.6 F (36.4 C)  Ht 5' 9.7" (1.77 m)  Wt 129 lb (58.514 kg)  BMI 18.68 kg/m2  SpO2 79%   Subjective:    Patient ID: Patrick Ayala, male    DOB: [birth year], 75 y.o.   MRN: [id number]  HPI: Patrick Ayala is a 75 y.o. male  Chief Complaint  Patient presents with  . Annual Exam   Patient recheck for blood pressure which is doing good no complaints from medications Takes Lipitor without problems COPD stable uses Symbicort Reviewed notes from hematology patient getting regular phlebotomies and B12 for pernicious anemia Otherwise doing well with no complaints Continues to smoke 1 pack a day or so CT scan done last month was stable  Relevant past medical, surgical, family and social history reviewed and updated as indicated. Interim medical history since our last visit reviewed. Allergies and medications reviewed and updated.  Review of Systems  Constitutional: Negative.   HENT: Negative.   Eyes: Negative.   Respiratory: Positive for cough and shortness of breath.   Cardiovascular: Negative.   Gastrointestinal: Negative.   Endocrine: Negative.   Genitourinary: Negative.   Musculoskeletal: Negative.   Skin: Negative.   Allergic/Immunologic: Negative.   Neurological: Negative.   Hematological: Negative.   Psychiatric/Behavioral: Negative.     Per HPI unless specifically indicated above     Objective:    BP 144/68 mmHg  Pulse 88  Temp(Src) 97.6 F (36.4 C)  Ht 5' 9.7" (1.77 m)  Wt 129 lb (58.514 kg)  BMI 18.68 kg/m2  SpO2 79%  Wt Readings from Last 3 Encounters:  07/19/15 129 lb (58.514 kg)  07/08/15 134 lb 2.4 oz (60.85 kg)  06/28/15 130 lb (58.968 kg)    Physical Exam  Constitutional: He is oriented to person, place, and time. He appears well-developed and well-nourished.  HENT:  Head: Normocephalic and atraumatic.  Right Ear: External ear normal.  Left Ear: External ear normal.  Eyes: Conjunctivae and EOM are  normal. Pupils are equal, round, and reactive to light.  Neck: Normal range of motion. Neck supple.  Cardiovascular: Normal rate, regular rhythm, normal heart sounds and intact distal pulses.   Pulmonary/Chest: Effort normal and breath sounds normal.  Abdominal: Soft. Bowel sounds are normal. There is no splenomegaly or hepatomegaly.  Genitourinary: Rectum normal and penis normal.  Prostate enlarged  Musculoskeletal: Normal range of motion.  Neurological: He is alert and oriented to person, place, and time. He has normal reflexes.  Skin: No rash noted. No erythema.  Psychiatric: He has a normal mood and affect. His behavior is normal. Judgment and thought content normal.    Results for orders placed or performed in visit on 06/21/15  CBC with Differential  Result Value Ref Range   WBC 8.0 3.8 - 10.6 K/uL   RBC 6.17 (H) 4.40 - 5.90 MIL/uL   Hemoglobin 16.2 13.0 - 18.0 g/dL   HCT 51.4 40.0 - 52.0 %   MCV 83.3 80.0 - 100.0 fL   MCH 26.3 26.0 - 34.0 pg   MCHC 31.6 (L) 32.0 - 36.0 g/dL   RDW 19.7 (H) 11.5 - 14.5 %   Platelets 79 (L) 150 - 440 K/uL   Neutrophils Relative % 57% %   Neutro Abs 4.6 1.4 - 6.5 K/uL   Lymphocytes Relative 26% %   Lymphs Abs 2.0 1.0 - 3.6 K/uL   Monocytes Relative 15% %   Monocytes Absolute 1.2 (H) 0.2 -  1.0 K/uL   Eosinophils Relative 1% %   Eosinophils Absolute 0.1 0 - 0.7 K/uL   Basophils Relative 1% %   Basophils Absolute 0.1 0 - 0.1 K/uL      Assessment & Plan:   Problem List Items Addressed This Visit      Cardiovascular and Mediastinum   Essential hypertension - Primary    The current medical regimen is effective;  continue present plan and medications.       Relevant Medications   amLODipine (NORVASC) 5 MG tablet   atorvastatin (LIPITOR) 20 MG tablet   Other Relevant Orders   Comprehensive metabolic panel     Respiratory   COPD (chronic obstructive pulmonary disease) (HCC)    Stable for now encouraged to quit smoking      Relevant  Medications   budesonide-formoterol (SYMBICORT) 160-4.5 MCG/ACT inhaler     Digestive   B12 deficiency    Getting B12 injections from oncology      Relevant Orders   TSH   Urinalysis, Routine w reflex microscopic (not at [facility])     Genitourinary   BPH (benign prostatic hyperplasia)   Relevant Orders   PSA     Other   Polycythemia, secondary    Followed by hematology in stable      Relevant Orders   Comprehensive metabolic panel   TSH   Urinalysis, Routine w reflex microscopic (not at [facility])   Weight loss    Weight has remained stable recently      Relevant Orders   TSH   Urinalysis, Routine w reflex microscopic (not at [facility])   Hyperlipidemia    The current medical regimen is effective;  continue present plan and medications.       Relevant Medications   amLODipine (NORVASC) 5 MG tablet   atorvastatin (LIPITOR) 20 MG tablet   Other Relevant Orders   Comprehensive metabolic panel   Lipid panel   TSH   Urinalysis, Routine w reflex microscopic (not at [facility])    Other Visit Diagnoses    PE (physical exam), annual            Follow up plan: Return in about 6 months (around 01/18/2016) for Recheck BMP, lipids, ALT, AST.

## 2015-07-20 ENCOUNTER — Encounter: Payer: Self-pay | Admitting: Hematology and Oncology

## 2015-07-20 ENCOUNTER — Encounter: Payer: Self-pay | Admitting: Family Medicine

## 2015-07-20 LAB — TSH: TSH: 1.62 u[IU]/mL (ref 0.450–4.500)

## 2015-07-20 LAB — COMPREHENSIVE METABOLIC PANEL
ALBUMIN: 4.7 g/dL (ref 3.5–4.8)
ALK PHOS: 68 IU/L (ref 39–117)
ALT: 17 IU/L (ref 0–44)
AST: 25 IU/L (ref 0–40)
Albumin/Globulin Ratio: 1.4 (ref 1.2–2.2)
BILIRUBIN TOTAL: 1 mg/dL (ref 0.0–1.2)
BUN / CREAT RATIO: 13 (ref 10–24)
BUN: 10 mg/dL (ref 8–27)
CHLORIDE: 90 mmol/L — AB (ref 96–106)
CO2: 22 mmol/L (ref 18–29)
CREATININE: 0.79 mg/dL (ref 0.76–1.27)
Calcium: 9.6 mg/dL (ref 8.6–10.2)
GFR calc non Af Amer: 90 mL/min/{1.73_m2} (ref >59)
GFR, EST AFRICAN AMERICAN: 104 mL/min/{1.73_m2} (ref >59)
GLOBULIN, TOTAL: 3.3 g/dL (ref 1.5–4.5)
Glucose: 89 mg/dL (ref 65–99)
Potassium: 4.7 mmol/L (ref 3.5–5.2)
SODIUM: 137 mmol/L (ref 134–144)
Total Protein: 8 g/dL (ref 6.0–8.5)

## 2015-07-20 LAB — LIPID PANEL
CHOLESTEROL TOTAL: 147 mg/dL (ref 100–199)
Chol/HDL Ratio: 1.7 ratio units (ref 0.0–5.0)
HDL: 86 mg/dL (ref >39)
LDL CALC: 51 mg/dL (ref 0–99)
Triglycerides: 50 mg/dL (ref 0–149)
VLDL Cholesterol Cal: 10 mg/dL (ref 5–40)

## 2015-07-20 LAB — PSA: Prostate Specific Ag, Serum: 0.8 ng/mL (ref 0.0–4.0)

## 2015-07-30 DIAGNOSIS — I10 Essential (primary) hypertension: Secondary | ICD-10-CM | POA: Diagnosis not present

## 2015-08-16 ENCOUNTER — Inpatient Hospital Stay: Payer: Commercial Managed Care - HMO | Attending: Hematology and Oncology

## 2015-08-16 ENCOUNTER — Inpatient Hospital Stay: Payer: Commercial Managed Care - HMO

## 2015-08-16 DIAGNOSIS — D696 Thrombocytopenia, unspecified: Secondary | ICD-10-CM

## 2015-08-16 DIAGNOSIS — D751 Secondary polycythemia: Secondary | ICD-10-CM

## 2015-08-16 DIAGNOSIS — E538 Deficiency of other specified B group vitamins: Secondary | ICD-10-CM | POA: Insufficient documentation

## 2015-08-16 DIAGNOSIS — Z79899 Other long term (current) drug therapy: Secondary | ICD-10-CM | POA: Diagnosis not present

## 2015-08-16 LAB — CBC WITH DIFFERENTIAL/PLATELET
Basophils Absolute: 0.1 10*3/uL (ref 0–0.1)
Basophils Relative: 1 %
Eosinophils Absolute: 0 10*3/uL (ref 0–0.7)
Eosinophils Relative: 1 %
HCT: 54.3 % — ABNORMAL HIGH (ref 40.0–52.0)
Hemoglobin: 17.2 g/dL (ref 13.0–18.0)
Lymphocytes Relative: 27 %
Lymphs Abs: 1.9 10*3/uL (ref 1.0–3.6)
MCH: 26.8 pg (ref 26.0–34.0)
MCHC: 31.7 g/dL — ABNORMAL LOW (ref 32.0–36.0)
MCV: 84.4 fL (ref 80.0–100.0)
Monocytes Absolute: 1 10*3/uL (ref 0.2–1.0)
Monocytes Relative: 15 %
Neutro Abs: 3.9 10*3/uL (ref 1.4–6.5)
Neutrophils Relative %: 56 %
Platelets: 72 10*3/uL — ABNORMAL LOW (ref 150–440)
RBC: 6.43 MIL/uL — ABNORMAL HIGH (ref 4.40–5.90)
RDW: 21 % — ABNORMAL HIGH (ref 11.5–14.5)
WBC: 7 10*3/uL (ref 3.8–10.6)

## 2015-08-16 MED ORDER — CYANOCOBALAMIN 1000 MCG/ML IJ SOLN
1000.0000 ug | Freq: Once | INTRAMUSCULAR | Status: AC
  Administered 2015-08-16: 1000 ug via INTRAMUSCULAR
  Filled 2015-08-16: qty 1

## 2015-08-18 ENCOUNTER — Telehealth: Payer: Self-pay | Admitting: *Deleted

## 2015-08-18 NOTE — Telephone Encounter (Signed)
Called and left message with patient that the FMLA papers for his son Patrick Ayala is done.  There are a few sections where his son wil need to fill out and he can call and tell me if he wants me to mail it to him or he can pick it up.

## 2015-08-19 ENCOUNTER — Inpatient Hospital Stay: Payer: Commercial Managed Care - HMO

## 2015-08-19 ENCOUNTER — Other Ambulatory Visit: Payer: Self-pay | Admitting: Hematology and Oncology

## 2015-08-19 DIAGNOSIS — D751 Secondary polycythemia: Secondary | ICD-10-CM

## 2015-08-19 DIAGNOSIS — E538 Deficiency of other specified B group vitamins: Secondary | ICD-10-CM | POA: Diagnosis not present

## 2015-08-19 DIAGNOSIS — Z79899 Other long term (current) drug therapy: Secondary | ICD-10-CM | POA: Diagnosis not present

## 2015-08-29 DIAGNOSIS — I10 Essential (primary) hypertension: Secondary | ICD-10-CM | POA: Diagnosis not present

## 2015-09-13 ENCOUNTER — Inpatient Hospital Stay: Payer: Commercial Managed Care - HMO

## 2015-09-15 ENCOUNTER — Inpatient Hospital Stay: Payer: Commercial Managed Care - HMO | Attending: Hematology and Oncology

## 2015-09-15 ENCOUNTER — Other Ambulatory Visit: Payer: Self-pay | Admitting: Hematology and Oncology

## 2015-09-15 ENCOUNTER — Inpatient Hospital Stay: Payer: Commercial Managed Care - HMO

## 2015-09-15 DIAGNOSIS — D751 Secondary polycythemia: Secondary | ICD-10-CM

## 2015-09-15 DIAGNOSIS — E538 Deficiency of other specified B group vitamins: Secondary | ICD-10-CM | POA: Insufficient documentation

## 2015-09-15 DIAGNOSIS — Z79899 Other long term (current) drug therapy: Secondary | ICD-10-CM | POA: Diagnosis not present

## 2015-09-15 DIAGNOSIS — D696 Thrombocytopenia, unspecified: Secondary | ICD-10-CM

## 2015-09-15 LAB — CBC WITH DIFFERENTIAL/PLATELET
Basophils Absolute: 0 10*3/uL (ref 0–0.1)
Basophils Relative: 0 %
Eosinophils Absolute: 0.1 10*3/uL (ref 0–0.7)
Eosinophils Relative: 1 %
HCT: 53.5 % — ABNORMAL HIGH (ref 40.0–52.0)
Hemoglobin: 17.2 g/dL (ref 13.0–18.0)
Lymphocytes Relative: 27 %
Lymphs Abs: 2.1 10*3/uL (ref 1.0–3.6)
MCH: 27.6 pg (ref 26.0–34.0)
MCHC: 32.1 g/dL (ref 32.0–36.0)
MCV: 85.9 fL (ref 80.0–100.0)
Monocytes Absolute: 1 10*3/uL (ref 0.2–1.0)
Monocytes Relative: 13 %
Neutro Abs: 4.6 10*3/uL (ref 1.4–6.5)
Neutrophils Relative %: 59 %
Platelets: 94 10*3/uL — ABNORMAL LOW (ref 150–440)
RBC: 6.23 MIL/uL — ABNORMAL HIGH (ref 4.40–5.90)
RDW: 20.3 % — ABNORMAL HIGH (ref 11.5–14.5)
WBC: 7.8 10*3/uL (ref 3.8–10.6)

## 2015-09-15 MED ORDER — CYANOCOBALAMIN 1000 MCG/ML IJ SOLN
1000.0000 ug | Freq: Once | INTRAMUSCULAR | Status: AC
  Administered 2015-09-15: 1000 ug via INTRAMUSCULAR
  Filled 2015-09-15: qty 1

## 2015-09-17 ENCOUNTER — Encounter: Payer: Self-pay | Admitting: *Deleted

## 2015-09-17 ENCOUNTER — Ambulatory Visit
Admission: EM | Admit: 2015-09-17 | Discharge: 2015-09-17 | Disposition: A | Discharge status: Home / Self Care | Payer: Commercial Managed Care - HMO | Attending: Family Medicine | Admitting: Family Medicine

## 2015-09-17 DIAGNOSIS — J441 Chronic obstructive pulmonary disease with (acute) exacerbation: Secondary | ICD-10-CM | POA: Diagnosis not present

## 2015-09-17 LAB — RAPID STREP SCREEN (MED CTR MEBANE ONLY): STREPTOCOCCUS, GROUP A SCREEN (DIRECT): NEGATIVE

## 2015-09-17 MED ORDER — PREDNISONE 20 MG PO TABS: ORAL_TABLET | ORAL | Status: DC

## 2015-09-17 MED ORDER — AMOXICILLIN-POT CLAVULANATE 875-125 MG PO TABS: 1.0000 | ORAL_TABLET | Freq: Two times a day (BID) | ORAL | Status: DC

## 2015-09-17 NOTE — ED Notes (Signed)
Pt hx of COPD, uses home nasal O2 @ 2 lpm.

## 2015-09-17 NOTE — ED Provider Notes (Signed)
CSN: [id number]     Arrival date & time 09/17/15  1022 History   None    Chief Complaint  Patient presents with  . Sore Throat  . Facial Pain  . Nasal Congestion  . Headache  . Cough   (Consider location/radiation/quality/duration/timing/severity/associated sxs/prior Treatment) Patient is a 75 y.o. male presenting with URI. The history is provided by the patient.  URI Presenting symptoms: congestion, cough and sore throat   Severity:  Moderate Onset quality:  Sudden Duration:  2 weeks Timing:  Constant Progression:  Worsening Chronicity:  Chronic (worsening baseline symptoms; patient has COPD and is on chronic  home O2 (did not bring portable O2 with him)) Relieved by:  Nothing Ineffective treatments:  OTC medications Associated symptoms: wheezing   Associated symptoms: no arthralgias, no headaches, no myalgias, no neck pain, no sinus pain, no sneezing and no swollen glands   Risk factors: being elderly and chronic respiratory disease (COPD; on chronic O2)   Risk factors: no chronic cardiac disease, no chronic kidney disease, no immunosuppression, no recent illness, no recent travel and no sick contacts     Past Medical History  Diagnosis Date  . Carboxyhemoglobinemia   . Thrombocytopenia ([place])   . Polycythemia, secondary 10/20/2014  . COPD (chronic obstructive pulmonary disease) [facility])    Past Surgical History  Procedure Laterality Date  . Polycythemia    . Hernia repair    . Appendectomy    . Eye surgery Right     cataract   Family History  Problem Relation Age of Onset  . Cancer Mother   . Cancer Sister   . Cancer Brother    Social History  Substance Use Topics  . Smoking status: Current Every Day Smoker -- 1.00 packs/day    Types: Cigarettes  . Smokeless tobacco: Never Used     Comment: Smoking History 1(1)Packs per day  . Alcohol Use: 0.0 oz/week    0 Standard drinks or equivalent per week     Comment: 2 cans of beer a night    Review of Systems  HENT:  Positive for congestion and sore throat. Negative for sneezing.   Respiratory: Positive for cough and wheezing.   Musculoskeletal: Negative for myalgias, arthralgias and neck pain.  Neurological: Negative for headaches.    Allergies  No known allergies  Home Medications   Prior to Admission medications   Medication Sig Start Date End Date Taking? Authorizing Provider  amLODipine (NORVASC) 5 MG tablet Take 1 tablet (5 mg total) by mouth daily. 07/19/15  Yes Guadalupe Maple, MD  aspirin 81 MG tablet ASA 81 mg po once a day (start taking it from 08/02/2013)   Quantity: 0;  Refills: 0  Active   Yes Historical Provider, MD  atorvastatin (LIPITOR) 20 MG tablet Take 1 tablet (20 mg total) by mouth daily at 6 PM. 07/19/15  Yes Guadalupe Maple, MD  budesonide-formoterol (SYMBICORT) 160-4.5 MCG/ACT inhaler INHALE 2 TIMES DAILY 07/19/15  Yes Guadalupe Maple, MD  amoxicillin-clavulanate (AUGMENTIN) 875-125 MG tablet Take 1 tablet by mouth 2 (two) times daily. 09/17/15   Norval Gable, MD  ketorolac (ACULAR) 0.5 % ophthalmic solution  07/02/15   Historical Provider, MD  prednisoLONE acetate (PRED FORTE) 1 % ophthalmic suspension  07/01/15   Historical Provider, MD  predniSONE (DELTASONE) 20 MG tablet 2 tabs po qd for 5 days 09/17/15   Norval Gable, MD   Meds Ordered and Administered this Visit  Medications - No data to display  Pulse 98  Temp(Src) 98.4 F (36.9 C) (Oral)  Resp 16  Ht _0  (1.753 m)  Wt 130 lb (58.968 kg)  BMI 19.19 kg/m2  SpO2 93% No data found. 93% O2 sat on 2L per [place] (usual baseline home O2)   Physical Exam  Constitutional: He appears well-developed and well-nourished. No distress.  HENT:  Head: Normocephalic and atraumatic.  Right Ear: Tympanic membrane, external ear and ear canal normal.  Left Ear: Tympanic membrane, external ear and ear canal normal.  Nose: Nose normal.  Mouth/Throat: Uvula is midline, oropharynx is clear and moist and mucous membranes are normal. No  oropharyngeal exudate or tonsillar abscesses.  Eyes: Conjunctivae and EOM are normal. Pupils are equal, round, and reactive to light. Right eye exhibits no discharge. Left eye exhibits no discharge. No scleral icterus.  Neck: Normal range of motion. Neck supple. No tracheal deviation present. No thyromegaly present.  Cardiovascular: Normal rate, regular rhythm and normal heart sounds.   Pulmonary/Chest: Effort normal. No stridor. No respiratory distress. He has wheezes. He has no rales. He exhibits no tenderness.  Few diffuse wheezes; diffuse bilateral rhonchi  Lymphadenopathy:    He has no cervical adenopathy.  Neurological: He is alert.  Skin: Skin is warm and dry. No rash noted. He is not diaphoretic.  Nursing note and vitals reviewed.   ED Course  Procedures (including critical care time)  Labs Review Labs Reviewed  RAPID STREP SCREEN (NOT AT [facility])  CULTURE, GROUP A STREP [facility])    Imaging Review No results found.   Visual Acuity Review  Right Eye Distance:   Left Eye Distance:   Bilateral Distance:    Right Eye Near:   Left Eye Near:    Bilateral Near:         MDM   1. COPD exacerbation [facility])    Discharge Medication List as of 09/17/2015 11:40 AM    START taking these medications   Details  amoxicillin-clavulanate (AUGMENTIN) 875-125 MG tablet Take 1 tablet by mouth 2 (two) times daily., Starting 09/17/2015, Until Discontinued, Normal    predniSONE (DELTASONE) 20 MG tablet 2 tabs po qd for 5 days, Normal       1. diagnosis reviewed with patient 2. rx as per orders above; reviewed possible side effects, interactions, risks and benefits; continue home inhalers/nebulizer treatments  3. Follow-up prn if symptoms worsen or don't improve    Norval Gable, MD 09/17/15 1335

## 2015-09-17 NOTE — ED Notes (Signed)
Sore throat, facial pain, runny nose, head congestion, headache, and productive cough- green, x2 weeks. Pt denies fever.

## 2015-09-17 NOTE — Discharge Instructions (Signed)
Chronic Obstructive Pulmonary Disease Chronic obstructive pulmonary disease (COPD) is a common lung condition in which airflow from the lungs is limited. COPD is a general term that can be used to describe many different lung problems that limit airflow, including both chronic bronchitis and emphysema. If you have COPD, your lung function will probably never return to normal, but there are measures you can take to improve lung function and make yourself feel better. CAUSES   Smoking (common).  Exposure to secondhand smoke.  Genetic problems.  Chronic inflammatory lung diseases or recurrent infections. SYMPTOMS  Shortness of breath, especially with physical activity.  Deep, persistent (chronic) cough with a large amount of thick mucus.  Wheezing.  Rapid breaths (tachypnea).  Gray or bluish discoloration (cyanosis) of the skin, especially in your fingers, toes, or lips.  Fatigue.  Weight loss.  Frequent infections or episodes when breathing symptoms become much worse (exacerbations).  Chest tightness. DIAGNOSIS Your health care provider will take a medical history and perform a physical examination to diagnose COPD. Additional tests for COPD may include:  Lung (pulmonary) function tests.  Chest X-ray.  CT scan.  Blood tests. TREATMENT  Treatment for COPD may include:  Inhaler and nebulizer medicines. These help manage the symptoms of COPD and make your breathing more comfortable.  Supplemental oxygen. Supplemental oxygen is only helpful if you have a low oxygen level in your blood.  Exercise and physical activity. These are beneficial for nearly all people with COPD.  Lung surgery or transplant.  Nutrition therapy to gain weight, if you are underweight.  Pulmonary rehabilitation. This may involve working with a team of health care providers and specialists, such as respiratory, occupational, and physical therapists. HOME CARE INSTRUCTIONS  Take all medicines  (inhaled or pills) as directed by your health care provider.  Avoid over-the-counter medicines or cough syrups that dry up your airway (such as antihistamines) and slow down the elimination of secretions unless instructed otherwise by your health care provider.  If you are a smoker, the most important thing that you can do is stop smoking. Continuing to smoke will cause further lung damage and breathing trouble. Ask your health care provider for help with quitting smoking. He or she can direct you to community resources or hospitals that provide support.  Avoid exposure to irritants such as smoke, chemicals, and fumes that aggravate your breathing.  Use oxygen therapy and pulmonary rehabilitation if directed by your health care provider. If you require home oxygen therapy, ask your health care provider whether you should purchase a pulse oximeter to measure your oxygen level at home.  Avoid contact with individuals who have a contagious illness.  Avoid extreme temperature and humidity changes.  Eat healthy foods. Eating smaller, more frequent meals and resting before meals may help you maintain your strength.  Stay active, but balance activity with periods of rest. Exercise and physical activity will help you maintain your ability to do things you want to do.  Preventing infection and hospitalization is very important when you have COPD. Make sure to receive all the vaccines your health care provider recommends, especially the pneumococcal and influenza vaccines. Ask your health care provider whether you need a pneumonia vaccine.  Learn and use relaxation techniques to manage stress.  Learn and use controlled breathing techniques as directed by your health care provider. Controlled breathing techniques include:  Pursed lip breathing. Start by breathing in (inhaling) through your nose for 1 second. Then, purse your lips as if you were   going to whistle and breathe out (exhale) through the  pursed lips for 2 seconds.  Diaphragmatic breathing. Start by putting one hand on your abdomen just above your waist. Inhale slowly through your nose. The hand on your abdomen should move out. Then purse your lips and exhale slowly. You should be able to feel the hand on your abdomen moving in as you exhale.  Learn and use controlled coughing to clear mucus from your lungs. Controlled coughing is a series of short, progressive coughs. The steps of controlled coughing are: 1. Lean your head slightly forward. 2. Breathe in deeply using diaphragmatic breathing. 3. Try to hold your breath for 3 seconds. 4. Keep your mouth slightly open while coughing twice. 5. Spit any mucus out into a tissue. 6. Rest and repeat the steps once or twice as needed. SEEK MEDICAL CARE IF:  You are coughing up more mucus than usual.  There is a change in the color or thickness of your mucus.  Your breathing is more labored than usual.  Your breathing is faster than usual. SEEK IMMEDIATE MEDICAL CARE IF:  You have shortness of breath while you are resting.  You have shortness of breath that prevents you from:  Being able to talk.  Performing your usual physical activities.  You have chest pain lasting longer than 5 minutes.  Your skin color is more cyanotic than usual.  You measure low oxygen saturations for longer than 5 minutes with a pulse oximeter. MAKE SURE YOU:  Understand these instructions.  Will watch your condition.  Will get help right away if you are not doing well or get worse.   This information is not intended to replace advice given to you by your health care provider. Make sure you discuss any questions you have with your health care provider.   Document Released: 01/04/2005 Document Revised: 04/17/2014 Document Reviewed: 11/21/2012 Elsevier Interactive Patient Education [employer].

## 2015-09-20 LAB — CULTURE, GROUP A STREP (THRC)

## 2015-10-11 ENCOUNTER — Other Ambulatory Visit: Payer: Commercial Managed Care - HMO

## 2015-10-11 ENCOUNTER — Ambulatory Visit: Payer: Commercial Managed Care - HMO

## 2015-10-11 ENCOUNTER — Ambulatory Visit: Payer: Commercial Managed Care - HMO | Admitting: Hematology and Oncology

## 2015-10-22 ENCOUNTER — Inpatient Hospital Stay: Payer: Commercial Managed Care - HMO

## 2015-10-22 ENCOUNTER — Inpatient Hospital Stay: Payer: Commercial Managed Care - HMO | Attending: Hematology and Oncology | Admitting: Hematology and Oncology

## 2015-10-22 ENCOUNTER — Other Ambulatory Visit: Payer: Self-pay | Admitting: Hematology and Oncology

## 2015-10-22 ENCOUNTER — Encounter: Payer: Self-pay | Admitting: Hematology and Oncology

## 2015-10-22 VITALS — BP 152/76 | HR 88 | Temp 98.4°F | Resp 18 | Wt 129.9 lb

## 2015-10-22 DIAGNOSIS — J449 Chronic obstructive pulmonary disease, unspecified: Secondary | ICD-10-CM | POA: Insufficient documentation

## 2015-10-22 DIAGNOSIS — D751 Secondary polycythemia: Secondary | ICD-10-CM

## 2015-10-22 DIAGNOSIS — Z7982 Long term (current) use of aspirin: Secondary | ICD-10-CM | POA: Insufficient documentation

## 2015-10-22 DIAGNOSIS — Z79899 Other long term (current) drug therapy: Secondary | ICD-10-CM | POA: Diagnosis not present

## 2015-10-22 DIAGNOSIS — E538 Deficiency of other specified B group vitamins: Secondary | ICD-10-CM | POA: Insufficient documentation

## 2015-10-22 DIAGNOSIS — F17210 Nicotine dependence, cigarettes, uncomplicated: Secondary | ICD-10-CM | POA: Diagnosis not present

## 2015-10-22 DIAGNOSIS — D696 Thrombocytopenia, unspecified: Secondary | ICD-10-CM

## 2015-10-22 DIAGNOSIS — Z809 Family history of malignant neoplasm, unspecified: Secondary | ICD-10-CM | POA: Diagnosis not present

## 2015-10-22 LAB — CBC WITH DIFFERENTIAL/PLATELET
Basophils Absolute: 0.1 10*3/uL (ref 0–0.1)
Basophils Relative: 1 %
Eosinophils Absolute: 0.1 10*3/uL (ref 0–0.7)
Eosinophils Relative: 1 %
HCT: 51.5 % (ref 40.0–52.0)
Hemoglobin: 16.7 g/dL (ref 13.0–18.0)
Lymphocytes Relative: 24 %
Lymphs Abs: 2.3 10*3/uL (ref 1.0–3.6)
MCH: 28.9 pg (ref 26.0–34.0)
MCHC: 32.4 g/dL (ref 32.0–36.0)
MCV: 89.3 fL (ref 80.0–100.0)
Monocytes Absolute: 0.9 10*3/uL (ref 0.2–1.0)
Monocytes Relative: 9 %
Neutro Abs: 6.5 10*3/uL (ref 1.4–6.5)
Neutrophils Relative %: 65 %
Platelets: 79 10*3/uL — ABNORMAL LOW (ref 150–440)
RBC: 5.77 MIL/uL (ref 4.40–5.90)
RDW: 21.8 % — ABNORMAL HIGH (ref 11.5–14.5)
WBC: 9.8 10*3/uL (ref 3.8–10.6)

## 2015-10-22 MED ORDER — CYANOCOBALAMIN 1000 MCG/ML IJ SOLN
1000.0000 ug | Freq: Once | INTRAMUSCULAR | Status: AC
  Administered 2015-10-22: 1000 ug via INTRAMUSCULAR
  Filled 2015-10-22: qty 1

## 2015-10-22 NOTE — Progress Notes (Signed)
Patient is here for follow up, no complaints doing very well today

## 2015-10-22 NOTE — Progress Notes (Signed)
[facility] day:  10/22/2015  Chief Complaint: Patrick Ayala is a 75 y.o. male with secondary polycythemia and thrombocytopenia and B12 deficiency who is seen for B12 injection and possible phlebotomy.  HPI: The patient was last seen in the medical oncology clinic on 07/08/2015.  At that time, he was seen for review of interval chest CT and 3 month assessment.  Chest CT revealed resolution of the left upper lobe pulmonary nodule.  CBC revealed a hematocrit of 51.4, hemoglobin 16.2, MCV 83.3, and platelets 79,000.  He did not undergo phlebotomy.  I encouraged smoking cessation.  He has continued his monthly B12.  He is smoking 3/4th pack a day.    Symptomatically, he feels fine. He denies any complaint.  He still needs to have several teeth extracted.  He is unclear of the timing.  He denies any chest pain or shortness of breath.  He denies any headache or visual changes.  He denies any bruising or bleeding.   Past Medical History  Diagnosis Date  . Carboxyhemoglobinemia   . Thrombocytopenia ([place])   . Polycythemia, secondary 10/20/2014  . COPD (chronic obstructive pulmonary disease) [facility])     Past Surgical History  Procedure Laterality Date  . Polycythemia    . Hernia repair    . Appendectomy    . Eye surgery Right     cataract    Family History  Problem Relation Age of Onset  . Cancer Mother   . Cancer Sister   . Cancer Brother     Social History:  reports that he has been smoking Cigarettes.  He has been smoking about 1.00 pack per day. He has never used smokeless tobacco. He reports that he drinks alcohol. He reports that he does not use illicit drugs.  He smokes 3/4th pack per day.  He drinks 2 beers per night.  He lives in [place].  He is alone today.  Allergies:  Allergies  Allergen Reactions  . No Known Allergies     Current Medications: Current Outpatient Prescriptions  Medication Sig Dispense Refill  .  amLODipine (NORVASC) 5 MG tablet Take 1 tablet (5 mg total) by mouth daily. 90 tablet 4  . aspirin 81 MG tablet ASA 81 mg po once a day (start taking it from 08/02/2013)   Quantity: 0;  Refills: 0  Active    . atorvastatin (LIPITOR) 20 MG tablet Take 1 tablet (20 mg total) by mouth daily at 6 PM. 90 tablet 4  . budesonide-formoterol (SYMBICORT) 160-4.5 MCG/ACT inhaler INHALE 2 TIMES DAILY 30.6 Inhaler 12  . ketorolac (ACULAR) 0.5 % ophthalmic solution     . prednisoLONE acetate (PRED FORTE) 1 % ophthalmic suspension     . predniSONE (DELTASONE) 20 MG tablet 2 tabs po qd for 5 days 10 tablet 0  . amoxicillin-clavulanate (AUGMENTIN) 875-125 MG tablet Take 1 tablet by mouth 2 (two) times daily. (Patient not taking: Reported on 10/22/2015) 14 tablet 0   No current facility-administered medications for this visit.    Review of Systems:  GENERAL:  Feels "fine".  No fevers or sweats.  Weight down 1 pound. PERFORMANCE STATUS (ECOG):  1 HEENT:  Needs dental extractions.  No visual changes, runny nose, sore throat, mouth sores or tenderness. Lungs: Shortness of breath on exertion.  Chronic cough for years.  No hemoptysis. Cardiac:  No chest pain, palpitations, orthopnea, or PND. GI:  No nausea, vomiting, diarrhea, constipation, melena or hematochezia. GU:  No urgency, frequency, dysuria, or hematuria. Musculoskeletal:  Neck pain.  No back pain.  No joint pain.  No muscle tenderness. Extremities:  No pain or swelling. Skin:  No rashes or skin changes. Neuro:  No headache, numbness or weakness, balance or coordination issues. Endocrine:  No diabetes, thyroid issues, hot flashes or night sweats. Psych:  No mood changes, depression or anxiety. Pain:  No focal pain. Review of systems:  All other systems reviewed and found to be negative.  Physical Exam: Blood pressure 152/76, pulse 88, temperature 98.4 F (36.9 C), temperature source Tympanic, resp. rate 18, weight 129 lb 13.6 oz (58.9 kg). GENERAL:   Thin gentleman sitting comfortably in the exam room in no acute distress. MENTAL STATUS:  Alert and oriented to person, place and time. HEAD: Pearline Cables hair.  Mustache.  Normocephalic, atraumatic, face symmetric, no Cushingoid features. EYES:  Brown eyes.  Ruddy sclera.  Pupils equal round and reactive to light and accomodation.  No conjunctivitis or scleral icterus. ENT:  Oropharynx clear without lesion.  Poor dentition.  Tongue normal. Mucous membranes moist.  RESPIRATORY:  Clear to auscultation without rales or rhonchi. CARDIOVASCULAR:  Regular rate and rhythm without murmur, rub or gallop. ABDOMEN:  Ventral hernia.  Soft, non-tender, with active bowel sounds, and no hepatosplenomegaly.  No masses. SKIN:  No rashes, ulcers or lesions. EXTREMITIES: No edema, no skin discoloration or tenderness.  No palpable cords. LYMPH NODES: No palpable cervical, supraclavicular, axillary or inguinal adenopathy  NEUROLOGICAL: Unremarkable. PSYCH:  Appropriate.   Appointment on 10/22/2015  Component Date Value Ref Range Status  . WBC 10/22/2015 9.8  3.8 - 10.6 K/uL Final  . RBC 10/22/2015 5.77  4.40 - 5.90 MIL/uL Final  . Hemoglobin 10/22/2015 16.7  13.0 - 18.0 g/dL Final  . HCT 10/22/2015 51.5  40.0 - 52.0 % Final  . MCV 10/22/2015 89.3  80.0 - 100.0 fL Final  . MCH 10/22/2015 28.9  26.0 - 34.0 pg Final  . MCHC 10/22/2015 32.4  32.0 - 36.0 g/dL Final  . RDW 10/22/2015 21.8* 11.5 - 14.5 % Final  . Platelets 10/22/2015 79* 150 - 440 K/uL Final  . Neutrophils Relative % 10/22/2015 65%   Final  . Neutro Abs 10/22/2015 6.5  1.4 - 6.5 K/uL Final  . Lymphocytes Relative 10/22/2015 24%   Final  . Lymphs Abs 10/22/2015 2.3  1.0 - 3.6 K/uL Final  . Monocytes Relative 10/22/2015 9%   Final  . Monocytes Absolute 10/22/2015 0.9  0.2 - 1.0 K/uL Final  . Eosinophils Relative 10/22/2015 1%   Final  . Eosinophils Absolute 10/22/2015 0.1  0 - 0.7 K/uL Final  . Basophils Relative 10/22/2015 1%   Final  . Basophils  Absolute 10/22/2015 0.1  0 - 0.1 K/uL Final    Assessment:  Patrick Ayala is a 75 y.o. male with secondary polycythemia and thrombocytopenia of unclear etiology.  He smokes and has a history of an elevated carboxyhemoglobin as high as 23% attributed to smoking in a small unventilated room for many hours and a truck exhaust leak (fixed about a year ago).  He has carbon monoxide  detectors. He smokes 1 pack per day, but is trying to cut back.  Platelet count has ranged from 61,000 to 139,000 without trend since 09/2012.  Prior work-up noted the following normal labs:  erythropoietin level, iron studies, JAK2, HIV, ANA, and hepatitis C. Ultrasound revealed no splenomegaly.  Chest CT revealed resolution of the left upper lobe pulmonary nodule. Chest  CT on 12/24/2014 for surveillance revealed a new 4 mm LUL nodule.  Chest CT on 06/23/2015 revealed resolution of the left upper lobe pulmonary nodule.   He has had multiple phlebotomies beginning in 09/2012. He undergoes small phlebotomies (250-300 cc) with supplimental IVF to maintain a hematocrit less than 52.  He has B12 deficiency.  B12 was 260 (low normal) on 12/07/2014.  MMA was elevated c/w B12 deficiency.  He was started on B12 weekly x 6 (02/01/2015 - 03/01/2015) prior to a monthly schedule (last 09/15/2015).  Folate was 10.2 on 12/07/2014.  Symptomatically, he denies any new complaints.  He needs several dental extractions.  Exam is stable.  Hematocrit is 51.5.  Platelet count is 79,000.  Plan: 1. Labs today:  CBC with diff. 2. No phlebotomy today.  Discuss borderline hematocrit.  Anticipate phlebotomy next month. 3. Encourage smoking cessation. 4. B12 today. 5. Check folate yearly (next 11/19/2015). 6. RTC monthly x 3 for CBC, B12 injection, and +/-  phlebotomy. 7. RTC in 4 months for MD assessment, labs (CBC with diff), B12 injection, and +/- phlebotomy.   Lequita Asal, MD  10/22/2015, 2:01 PM

## 2015-11-02 ENCOUNTER — Other Ambulatory Visit: Payer: Self-pay | Admitting: Family Medicine

## 2015-11-02 DIAGNOSIS — E785 Hyperlipidemia, unspecified: Secondary | ICD-10-CM

## 2015-11-09 ENCOUNTER — Telehealth: Payer: Self-pay

## 2015-11-09 ENCOUNTER — Other Ambulatory Visit: Payer: Self-pay | Admitting: Family Medicine

## 2015-11-09 DIAGNOSIS — E785 Hyperlipidemia, unspecified: Secondary | ICD-10-CM

## 2015-11-09 DIAGNOSIS — I10 Essential (primary) hypertension: Secondary | ICD-10-CM

## 2015-11-09 MED ORDER — ATORVASTATIN CALCIUM 20 MG PO TABS: 20.0000 mg | ORAL_TABLET | Freq: Every day | ORAL | 4 refills | Status: DC

## 2015-11-09 MED ORDER — AMLODIPINE BESYLATE 5 MG PO TABS: 5.0000 mg | ORAL_TABLET | Freq: Every day | ORAL | 4 refills | Status: DC

## 2015-11-09 MED ORDER — BUDESONIDE-FORMOTEROL FUMARATE 160-4.5 MCG/ACT IN AERO: INHALATION_SPRAY | RESPIRATORY_TRACT | 12 refills | Status: DC

## 2015-11-09 NOTE — Telephone Encounter (Signed)
Patient did not leave audible message, will call back

## 2015-11-09 NOTE — Telephone Encounter (Signed)
Patient's BP and cholesterol meds should have been sent to [facility]- please send

## 2015-11-09 NOTE — Telephone Encounter (Signed)
Returned patient's call, no answer, LVM to call back if we are still needed

## 2015-11-19 ENCOUNTER — Other Ambulatory Visit: Payer: Self-pay | Admitting: Family Medicine

## 2015-11-19 ENCOUNTER — Other Ambulatory Visit: Payer: Self-pay | Admitting: Hematology and Oncology

## 2015-11-19 ENCOUNTER — Inpatient Hospital Stay: Payer: Commercial Managed Care - HMO | Attending: Hematology and Oncology

## 2015-11-19 ENCOUNTER — Inpatient Hospital Stay: Payer: Commercial Managed Care - HMO

## 2015-11-19 VITALS — BP 127/73 | HR 100 | Resp 18

## 2015-11-19 DIAGNOSIS — E538 Deficiency of other specified B group vitamins: Secondary | ICD-10-CM | POA: Diagnosis not present

## 2015-11-19 DIAGNOSIS — D696 Thrombocytopenia, unspecified: Secondary | ICD-10-CM

## 2015-11-19 DIAGNOSIS — D751 Secondary polycythemia: Secondary | ICD-10-CM

## 2015-11-19 DIAGNOSIS — E785 Hyperlipidemia, unspecified: Secondary | ICD-10-CM

## 2015-11-19 DIAGNOSIS — Z79899 Other long term (current) drug therapy: Secondary | ICD-10-CM | POA: Diagnosis not present

## 2015-11-19 DIAGNOSIS — I10 Essential (primary) hypertension: Secondary | ICD-10-CM

## 2015-11-19 LAB — CBC WITH DIFFERENTIAL/PLATELET
Basophils Absolute: 0 10*3/uL (ref 0–0.1)
Basophils Relative: 1 %
Eosinophils Absolute: 0.1 10*3/uL (ref 0–0.7)
Eosinophils Relative: 1 %
HCT: 52.2 % — ABNORMAL HIGH (ref 40.0–52.0)
Hemoglobin: 17.4 g/dL (ref 13.0–18.0)
Lymphocytes Relative: 25 %
Lymphs Abs: 1.9 10*3/uL (ref 1.0–3.6)
MCH: 31.5 pg (ref 26.0–34.0)
MCHC: 33.3 g/dL (ref 32.0–36.0)
MCV: 94.6 fL (ref 80.0–100.0)
Monocytes Absolute: 0.8 10*3/uL (ref 0.2–1.0)
Monocytes Relative: 10 %
Neutro Abs: 4.8 10*3/uL (ref 1.4–6.5)
Neutrophils Relative %: 63 %
Platelets: 72 10*3/uL — ABNORMAL LOW (ref 150–440)
RBC: 5.52 MIL/uL (ref 4.40–5.90)
RDW: 19.6 % — ABNORMAL HIGH (ref 11.5–14.5)
WBC: 7.6 10*3/uL (ref 3.8–10.6)

## 2015-11-19 LAB — FOLATE: Folate: 16.1 ng/mL (ref >5.9)

## 2015-11-19 MED ORDER — AMLODIPINE BESYLATE 5 MG PO TABS: 5.0000 mg | ORAL_TABLET | Freq: Every day | ORAL | 0 refills | Status: DC

## 2015-11-19 MED ORDER — CYANOCOBALAMIN 1000 MCG/ML IJ SOLN
1000.0000 ug | Freq: Once | INTRAMUSCULAR | Status: AC
  Administered 2015-11-19: 1000 ug via INTRAMUSCULAR
  Filled 2015-11-19: qty 1

## 2015-11-19 MED ORDER — ATORVASTATIN CALCIUM 20 MG PO TABS: 20.0000 mg | ORAL_TABLET | Freq: Every day | ORAL | 0 refills | Status: DC

## 2015-11-19 NOTE — Telephone Encounter (Signed)
Patient called, his Amlodipine 62m and Atorvastatin 258m [facility]   I verbally called both of these 90 day Rx's in (written 11/09/15)  Patient is [facility] 2 [facility] will take 2 weeks to arrive.

## 2015-11-19 NOTE — Addendum Note (Signed)
Addended by: Merrie Roof E on: 11/19/2015 12:30 PM   Modules accepted: Orders

## 2015-11-19 NOTE — Telephone Encounter (Signed)
2 week supplies for atorvastatin and amlodipine sent to [facility] to bridge him until mail order arrives.

## 2015-12-17 ENCOUNTER — Inpatient Hospital Stay: Payer: Commercial Managed Care - HMO

## 2015-12-21 ENCOUNTER — Other Ambulatory Visit: Payer: Self-pay | Admitting: Family Medicine

## 2015-12-24 ENCOUNTER — Other Ambulatory Visit: Payer: Self-pay | Admitting: Hematology and Oncology

## 2015-12-24 ENCOUNTER — Inpatient Hospital Stay: Payer: Commercial Managed Care - HMO

## 2015-12-24 ENCOUNTER — Other Ambulatory Visit: Payer: Self-pay

## 2015-12-24 ENCOUNTER — Inpatient Hospital Stay: Payer: Commercial Managed Care - HMO | Attending: Hematology and Oncology

## 2015-12-24 DIAGNOSIS — D751 Secondary polycythemia: Secondary | ICD-10-CM

## 2015-12-24 DIAGNOSIS — E538 Deficiency of other specified B group vitamins: Secondary | ICD-10-CM | POA: Diagnosis not present

## 2015-12-24 DIAGNOSIS — Z79899 Other long term (current) drug therapy: Secondary | ICD-10-CM | POA: Insufficient documentation

## 2015-12-24 DIAGNOSIS — D696 Thrombocytopenia, unspecified: Secondary | ICD-10-CM

## 2015-12-24 LAB — CBC WITH DIFFERENTIAL/PLATELET
Basophils Absolute: 0.1 10*3/uL (ref 0–0.1)
Basophils Relative: 1 %
Eosinophils Absolute: 0.1 10*3/uL (ref 0–0.7)
Eosinophils Relative: 1 %
HCT: 51.5 % (ref 40.0–52.0)
Hemoglobin: 17.3 g/dL (ref 13.0–18.0)
Lymphocytes Relative: 25 %
Lymphs Abs: 2.2 10*3/uL (ref 1.0–3.6)
MCH: 33.1 pg (ref 26.0–34.0)
MCHC: 33.7 g/dL (ref 32.0–36.0)
MCV: 98.1 fL (ref 80.0–100.0)
Monocytes Absolute: 0.9 10*3/uL (ref 0.2–1.0)
Monocytes Relative: 10 %
Neutro Abs: 5.4 10*3/uL (ref 1.4–6.5)
Neutrophils Relative %: 63 %
Platelets: 86 10*3/uL — ABNORMAL LOW (ref 150–440)
RBC: 5.24 MIL/uL (ref 4.40–5.90)
RDW: 15.1 % — ABNORMAL HIGH (ref 11.5–14.5)
WBC: 8.6 10*3/uL (ref 3.8–10.6)

## 2015-12-24 MED ORDER — CYANOCOBALAMIN 1000 MCG/ML IJ SOLN
1000.0000 ug | Freq: Once | INTRAMUSCULAR | Status: AC
  Administered 2015-12-24: 1000 ug via INTRAMUSCULAR
  Filled 2015-12-24: qty 1

## 2015-12-24 NOTE — Progress Notes (Signed)
No phlebotomy needed today. HCT 51.5.

## 2015-12-27 NOTE — Telephone Encounter (Signed)
  This encounter was created in error - please disregard.

## 2015-12-29 ENCOUNTER — Telehealth: Payer: Self-pay | Admitting: Family Medicine

## 2015-12-29 NOTE — Telephone Encounter (Signed)
Helene Kelp, Before sending messages like this be sure and glance at the medicine list and you'll see the patient has refills for a year that was given in August. You can tell him just go to the drugstore and pick it up.

## 2015-12-29 NOTE — Telephone Encounter (Signed)
Spoke with patient, [facility] does not fill this medication for him.  [facility] [place]

## 2015-12-30 ENCOUNTER — Telehealth: Payer: Self-pay

## 2015-12-30 MED ORDER — BUDESONIDE-FORMOTEROL FUMARATE 160-4.5 MCG/ACT IN AERO: 2.0000 | INHALATION_SPRAY | Freq: Every morning | RESPIRATORY_TRACT | 12 refills | Status: DC

## 2015-12-30 NOTE — Telephone Encounter (Signed)
[facility] uses   Symbicort 2 Puffs Twice Daily  Please send new Rx

## 2016-01-14 ENCOUNTER — Ambulatory Visit: Payer: Commercial Managed Care - HMO | Admitting: Hematology and Oncology

## 2016-01-14 ENCOUNTER — Inpatient Hospital Stay: Payer: Commercial Managed Care - HMO | Attending: Hematology and Oncology

## 2016-01-14 ENCOUNTER — Inpatient Hospital Stay: Payer: Commercial Managed Care - HMO

## 2016-01-14 ENCOUNTER — Other Ambulatory Visit: Payer: Self-pay

## 2016-01-14 DIAGNOSIS — D696 Thrombocytopenia, unspecified: Secondary | ICD-10-CM

## 2016-01-14 DIAGNOSIS — D751 Secondary polycythemia: Secondary | ICD-10-CM

## 2016-01-14 DIAGNOSIS — E538 Deficiency of other specified B group vitamins: Secondary | ICD-10-CM

## 2016-01-14 LAB — CBC WITH DIFFERENTIAL/PLATELET
Basophils Absolute: 0.1 10*3/uL (ref 0–0.1)
Basophils Relative: 1 %
Eosinophils Absolute: 0 10*3/uL (ref 0–0.7)
Eosinophils Relative: 1 %
HCT: 51.6 % (ref 40.0–52.0)
Hemoglobin: 17.8 g/dL (ref 13.0–18.0)
Lymphocytes Relative: 24 %
Lymphs Abs: 2.2 10*3/uL (ref 1.0–3.6)
MCH: 34.1 pg — ABNORMAL HIGH (ref 26.0–34.0)
MCHC: 34.5 g/dL (ref 32.0–36.0)
MCV: 98.8 fL (ref 80.0–100.0)
Monocytes Absolute: 0.9 10*3/uL (ref 0.2–1.0)
Monocytes Relative: 10 %
Neutro Abs: 5.9 10*3/uL (ref 1.4–6.5)
Neutrophils Relative %: 64 %
Platelets: 73 10*3/uL — ABNORMAL LOW (ref 150–440)
RBC: 5.22 MIL/uL (ref 4.40–5.90)
RDW: 14.2 % (ref 11.5–14.5)
WBC: 9.1 10*3/uL (ref 3.8–10.6)

## 2016-01-14 NOTE — Progress Notes (Signed)
HCT results of 51.6, no phlebotomy needed.    He was scheduled 1 week early for B12 injection.  Dr. Mike Gip request that patient wait 1 more week for injection to be the 4 weeks after last injection as ordered.  New appts scheduled and given to patient.

## 2016-01-18 ENCOUNTER — Ambulatory Visit: Payer: Commercial Managed Care - HMO | Admitting: Family Medicine

## 2016-01-20 ENCOUNTER — Encounter: Payer: Self-pay | Admitting: Family Medicine

## 2016-01-20 ENCOUNTER — Ambulatory Visit (INDEPENDENT_AMBULATORY_CARE_PROVIDER_SITE_OTHER): Payer: Commercial Managed Care - HMO | Admitting: Family Medicine

## 2016-01-20 VITALS — BP 157/74 | HR 89 | Temp 98.1°F | Ht 69.0 in | Wt 131.2 lb

## 2016-01-20 DIAGNOSIS — E7800 Pure hypercholesterolemia, unspecified: Secondary | ICD-10-CM | POA: Diagnosis not present

## 2016-01-20 DIAGNOSIS — I10 Essential (primary) hypertension: Secondary | ICD-10-CM | POA: Diagnosis not present

## 2016-01-20 DIAGNOSIS — E785 Hyperlipidemia, unspecified: Secondary | ICD-10-CM | POA: Diagnosis not present

## 2016-01-20 LAB — LP+ALT+AST PICCOLO, WAIVED
ALT (SGPT) Piccolo, Waived: 23 U/L (ref 10–47)
AST (SGOT) PICCOLO, WAIVED: 25 U/L (ref 11–38)
CHOL/HDL RATIO PICCOLO,WAIVE: 1.6 mg/dL
CHOLESTEROL PICCOLO, WAIVED: 126 mg/dL (ref <200)
HDL Chol Piccolo, Waived: 79 mg/dL (ref >59)
LDL Chol Calc Piccolo Waived: 38 mg/dL (ref <100)
TRIGLYCERIDES PICCOLO,WAIVED: 47 mg/dL (ref <150)
VLDL Chol Calc Piccolo,Waive: 9 mg/dL (ref <30)

## 2016-01-20 MED ORDER — AMLODIPINE BESYLATE 5 MG PO TABS: 5.0000 mg | ORAL_TABLET | Freq: Every day | ORAL | 2 refills | Status: DC

## 2016-01-20 MED ORDER — BENAZEPRIL HCL 40 MG PO TABS: 40.0000 mg | ORAL_TABLET | Freq: Every day | ORAL | 2 refills | Status: DC

## 2016-01-20 MED ORDER — ATORVASTATIN CALCIUM 20 MG PO TABS: 20.0000 mg | ORAL_TABLET | Freq: Every day | ORAL | 2 refills | Status: DC

## 2016-01-20 NOTE — Assessment & Plan Note (Signed)
The current medical regimen is effective;  continue present plan and medications.  

## 2016-01-20 NOTE — Progress Notes (Signed)
BP (!) 157/74 (BP Location: Right Arm, Patient Position: Sitting, Cuff Size: Normal)   Pulse 89   Temp 98.1 F (36.7 C)   Ht _0  (1.753 m)   Wt 131 lb 3.2 oz (59.5 kg)   SpO2 96%   BMI 19.37 kg/m    Subjective:    Patient ID: Patrick Ayala, male    DOB: [birth year], 75 y.o.   MRN: [id number]  HPI: Patrick Ayala is a 75 y.o. male  Chief Complaint  Patient presents with  . Follow-up  . Hyperlipidemia  . Hypertension   Patient all in all doing well no complaints from medications ordinarily takes without problems but did not take his medication today because he was fasting. Blood pressure is ordinarily good. Reviewed medical record with other doctor visits and visits here and blood pressure consistently elevated. On further discussion patient with home checking is usually in the 140s.  Cholesterol doing well no complaints Relevant past medical, surgical, family and social history reviewed and updated as indicated. Interim medical history since our last visit reviewed. Allergies and medications reviewed and updated.  Review of Systems  Constitutional: Negative.   Respiratory: Negative.   Cardiovascular: Negative.     Per HPI unless specifically indicated above     Objective:    BP (!) 157/74 (BP Location: Right Arm, Patient Position: Sitting, Cuff Size: Normal)   Pulse 89   Temp 98.1 F (36.7 C)   Ht _1  (1.753 m)   Wt 131 lb 3.2 oz (59.5 kg)   SpO2 96%   BMI 19.37 kg/m   Wt Readings from Last 3 Encounters:  01/20/16 131 lb 3.2 oz (59.5 kg)  10/22/15 129 lb 13.6 oz (58.9 kg)  09/17/15 130 lb (59 kg)    Physical Exam  Constitutional: He is oriented to person, place, and time. He appears well-developed and well-nourished. No distress.  HENT:  Head: Normocephalic and atraumatic.  Right Ear: Hearing normal.  Left Ear: Hearing normal.  Nose: Nose normal.  Eyes: Conjunctivae and lids are normal. Right eye exhibits no discharge. Left eye exhibits no  discharge. No scleral icterus.  Cardiovascular: Normal rate, regular rhythm and normal heart sounds.   Pulmonary/Chest: Effort normal and breath sounds normal. No respiratory distress.  Musculoskeletal: Normal range of motion.  Neurological: He is alert and oriented to person, place, and time.  Skin: Skin is intact. No rash noted.  Psychiatric: He has a normal mood and affect. His speech is normal and behavior is normal. Judgment and thought content normal. Cognition and memory are normal.    Results for orders placed or performed in visit on 01/14/16  CBC with Differential/Platelet  Result Value Ref Range   WBC 9.1 3.8 - 10.6 K/uL   RBC 5.22 4.40 - 5.90 MIL/uL   Hemoglobin 17.8 13.0 - 18.0 g/dL   HCT 51.6 40.0 - 52.0 %   MCV 98.8 80.0 - 100.0 fL   MCH 34.1 (H) 26.0 - 34.0 pg   MCHC 34.5 32.0 - 36.0 g/dL   RDW 14.2 11.5 - 14.5 %   Platelets 73 (L) 150 - 440 K/uL   Neutrophils Relative % 64 %   Neutro Abs 5.9 1.4 - 6.5 K/uL   Lymphocytes Relative 24 %   Lymphs Abs 2.2 1.0 - 3.6 K/uL   Monocytes Relative 10 %   Monocytes Absolute 0.9 0.2 - 1.0 K/uL   Eosinophils Relative 1 %   Eosinophils Absolute 0.0 0 - 0.7 K/uL  Basophils Relative 1 %   Basophils Absolute 0.1 0 - 0.1 K/uL      Assessment & Plan:   Problem List Items Addressed This Visit      Cardiovascular and Mediastinum   Essential hypertension - Primary    Long-term poor control even though not all medicines today on chart review elevated blood pressures when taking medicines and on home checking elevated blood pressure when taking medicines will add Benzapril 40 mg recheck 1 month with BMP      Relevant Medications   atorvastatin (LIPITOR) 20 MG tablet   benazepril (LOTENSIN) 40 MG tablet   amLODipine (NORVASC) 5 MG tablet   Other Relevant Orders   Basic metabolic panel     Other   Hyperlipidemia    The current medical regimen is effective;  continue present plan and medications.       Relevant Medications    atorvastatin (LIPITOR) 20 MG tablet   benazepril (LOTENSIN) 40 MG tablet   amLODipine (NORVASC) 5 MG tablet   Other Relevant Orders   LP+ALT+AST Piccolo, Waived   Basic metabolic panel    Other Visit Diagnoses   None.      Follow up plan: Return in about 4 weeks (around 02/17/2016) for BMP.

## 2016-01-20 NOTE — Assessment & Plan Note (Signed)
Long-term poor control even though not all medicines today on chart review elevated blood pressures when taking medicines and on home checking elevated blood pressure when taking medicines will add Benzapril 40 mg recheck 1 month with BMP

## 2016-01-21 ENCOUNTER — Inpatient Hospital Stay: Payer: Commercial Managed Care - HMO

## 2016-01-21 LAB — BASIC METABOLIC PANEL
BUN/Creatinine Ratio: 10 (ref 10–24)
BUN: 7 mg/dL — ABNORMAL LOW (ref 8–27)
CALCIUM: 9.3 mg/dL (ref 8.6–10.2)
CHLORIDE: 92 mmol/L — AB (ref 96–106)
CO2: 34 mmol/L — AB (ref 18–29)
Creatinine, Ser: 0.73 mg/dL — ABNORMAL LOW (ref 0.76–1.27)
GFR calc Af Amer: 106 mL/min/{1.73_m2} (ref >59)
GFR calc non Af Amer: 92 mL/min/{1.73_m2} (ref >59)
Glucose: 91 mg/dL (ref 65–99)
Potassium: 4.3 mmol/L (ref 3.5–5.2)
Sodium: 138 mmol/L (ref 134–144)

## 2016-01-24 ENCOUNTER — Encounter: Payer: Self-pay | Admitting: Family Medicine

## 2016-01-30 ENCOUNTER — Encounter: Payer: Self-pay | Admitting: Gynecology

## 2016-01-30 ENCOUNTER — Ambulatory Visit (INDEPENDENT_AMBULATORY_CARE_PROVIDER_SITE_OTHER): Payer: Commercial Managed Care - HMO

## 2016-01-30 ENCOUNTER — Ambulatory Visit
Admission: EM | Admit: 2016-01-30 | Discharge: 2016-01-30 | Disposition: A | Discharge status: Home / Self Care | Payer: Commercial Managed Care - HMO | Attending: Family Medicine | Admitting: Family Medicine

## 2016-01-30 DIAGNOSIS — J441 Chronic obstructive pulmonary disease with (acute) exacerbation: Secondary | ICD-10-CM

## 2016-01-30 DIAGNOSIS — J069 Acute upper respiratory infection, unspecified: Secondary | ICD-10-CM | POA: Diagnosis not present

## 2016-01-30 DIAGNOSIS — R05 Cough: Secondary | ICD-10-CM | POA: Diagnosis not present

## 2016-01-30 MED ORDER — IPRATROPIUM-ALBUTEROL 0.5-2.5 (3) MG/3ML IN SOLN
3.0000 mL | Freq: Once | RESPIRATORY_TRACT | Status: AC
  Administered 2016-01-30: 3 mL via RESPIRATORY_TRACT

## 2016-01-30 MED ORDER — AZITHROMYCIN 250 MG PO TABS: ORAL_TABLET | ORAL | 0 refills | Status: DC

## 2016-01-30 MED ORDER — ALBUTEROL SULFATE HFA 108 (90 BASE) MCG/ACT IN AERS: 1.0000 | INHALATION_SPRAY | Freq: Four times a day (QID) | RESPIRATORY_TRACT | 0 refills | Status: DC | PRN

## 2016-01-30 MED ORDER — PREDNISONE 20 MG PO TABS: ORAL_TABLET | ORAL | 0 refills | Status: DC

## 2016-01-30 NOTE — ED Triage Notes (Signed)
Patient c/o nasal congestion / cough x 4-5 days. Patient has a history of COPD

## 2016-01-30 NOTE — ED Provider Notes (Signed)
CSN: [id number]     Arrival date & time 01/30/16  9562 History   First MD Initiated Contact with Patient 01/30/16 1032     Chief Complaint  Patient presents with  . Cough   (Consider location/radiation/quality/duration/timing/severity/associated sxs/prior Treatment) HPI  Presents with nasal congestion and productive cough for 4-5 days. On O2 and on presentation had O2 Sat of 53 but O2 was not flowing on his portable machine. After hooking up O2 his Sats improved to 100%. Still smokes 1 PPD. Uses Symbicort inhaler at home. Usual O2 sats 93% on 2 L [place].      Past Medical History:  Diagnosis Date  . Carboxyhemoglobinemia   . COPD (chronic obstructive pulmonary disease) ([place])   . Polycythemia, secondary 10/20/2014  . Thrombocytopenia ([place])    Past Surgical History:  Procedure Laterality Date  . APPENDECTOMY    . EYE SURGERY Right    cataract  . HERNIA REPAIR    . polycythemia     Family History  Problem Relation Age of Onset  . Cancer Mother   . Cancer Sister   . Cancer Brother    Social History  Substance Use Topics  . Smoking status: Current Every Day Smoker    Packs/day: 1.00    Types: Cigarettes  . Smokeless tobacco: Never Used     Comment: Smoking History 1(1)Packs per day  . Alcohol use 0.0 oz/week     Comment: 2 cans of beer a night    Review of Systems  Constitutional: Positive for activity change. Negative for chills, fatigue and fever.  HENT: Positive for congestion, postnasal drip, sinus pressure and sneezing.   Respiratory: Positive for cough, shortness of breath and wheezing. Negative for stridor.     Allergies  No known allergies  Home Medications   Prior to Admission medications   Medication Sig Start Date End Date Taking? Authorizing Provider  amLODipine (NORVASC) 5 MG tablet Take 1 tablet (5 mg total) by mouth daily. 01/20/16  Yes Guadalupe Maple, MD  aspirin 81 MG tablet ASA 81 mg po once a day (start taking it from 08/02/2013)   Quantity: 0;   Refills: 0  Active   Yes Historical Provider, MD  atorvastatin (LIPITOR) 20 MG tablet Take 1 tablet (20 mg total) by mouth daily. 01/20/16  Yes Guadalupe Maple, MD  benazepril (LOTENSIN) 40 MG tablet Take 1 tablet (40 mg total) by mouth daily. 01/20/16  Yes Guadalupe Maple, MD  budesonide-formoterol (SYMBICORT) 160-4.5 MCG/ACT inhaler Inhale 2 puffs into the lungs every morning. INHALE 2 TIMES DAILY 12/30/15  Yes Guadalupe Maple, MD  albuterol (PROVENTIL HFA;VENTOLIN HFA) 108 (90 Base) MCG/ACT inhaler Inhale 1-2 puffs into the lungs every 6 (six) hours as needed for wheezing or shortness of breath. 01/30/16   Lorin Picket, PA-C  azithromycin (ZITHROMAX Z-PAK) 250 MG tablet Take per package instructions 01/30/16   Lorin Picket, PA-C  predniSONE (DELTASONE) 20 MG tablet Take 2 tablets (40 mg) daily by mouth 01/30/16   Lorin Picket, PA-C   Meds Ordered and Administered this Visit   Medications  ipratropium-albuterol (DUONEB) 0.5-2.5 (3) MG/3ML nebulizer solution 3 mL (3 mLs Nebulization Given 01/30/16 1102)    BP 112/88 (BP Location: Left Arm)   Pulse (!) 107   Temp 98.1 F (36.7 C) (Oral)   Resp 16   Ht _0  (1.753 m)   Wt 131 lb (59.4 kg)   SpO2 96%   BMI 19.35 kg/m  No  data found.   Physical Exam  Constitutional: He is oriented to person, place, and time. He appears well-developed and well-nourished. No distress.  HENT:  Head: Normocephalic and atraumatic.  Right Ear: External ear normal.  Left Ear: External ear normal.  Nose: Nose normal.  Mouth/Throat: Oropharynx is clear and moist.  Eyes: EOM are normal. Pupils are equal, round, and reactive to light. Right eye exhibits no discharge. Left eye exhibits no discharge.  Neck: Normal range of motion. Neck supple.  Cardiovascular: Normal rate, regular rhythm and normal heart sounds.  Exam reveals no gallop and no friction rub.   No murmur heard. Pulmonary/Chest: Effort normal. No respiratory distress. He has wheezes.  He has no rales.  Musculoskeletal: Normal range of motion.  Lymphadenopathy:    He has no cervical adenopathy.  Neurological: He is alert and oriented to person, place, and time.  Skin: Skin is warm and dry. He is not diaphoretic.  Psychiatric: He has a normal mood and affect. His behavior is normal. Judgment and thought content normal.  Nursing note and vitals reviewed.   Urgent Care Course   Clinical Course    Procedures (including critical care time)  Labs Review Labs Reviewed - No data to display  Imaging Review Dg Chest 2 View  Result Date: 01/30/2016 CLINICAL DATA:  PT with hx of COPD. Here today for cough x three days. EXAM: CHEST  2 VIEW COMPARISON:  06/23/2015, 07/29/2013 FINDINGS: Lungs are hyperinflated. Emphysematous changes are present. Heart size is normal. No focal consolidations. No pleural effusions or pulmonary edema. IMPRESSION: Hyperinflation/emphysematous changes. No focal acute pulmonary abnormality. Electronically Signed   By: Nolon Nations M.D.   On: 01/30/2016 11:07     Visual Acuity Review  Right Eye Distance:   Left Eye Distance:   Bilateral Distance:    Right Eye Near:   Left Eye Near:    Bilateral Near:    Medications  ipratropium-albuterol (DUONEB) 0.5-2.5 (3) MG/3ML nebulizer solution 3 mL (3 mLs Nebulization Given 01/30/16 1102)       MDM   1. Upper respiratory tract infection, unspecified type   2. COPD with acute exacerbation (HCC)    New Prescriptions   ALBUTEROL (PROVENTIL HFA;VENTOLIN HFA) 108 (90 BASE) MCG/ACT INHALER    Inhale 1-2 puffs into the lungs every 6 (six) hours as needed for wheezing or shortness of breath.   AZITHROMYCIN (ZITHROMAX Z-PAK) 250 MG TABLET    Take per package instructions   PREDNISONE (DELTASONE) 20 MG TABLET    Take 2 tablets (40 mg) daily by mouth  Plan: 1. Test/x-ray results and diagnosis reviewed with patient 2. rx as per orders; risks, benefits, potential side effects reviewed with  patient 3. Recommend supportive treatment with Albuterol PRN. F/U with PCP next week.  4. F/u prn if symptoms worsen or don't improve     Lorin Picket, PA-C 01/30/16 1136

## 2016-02-11 ENCOUNTER — Ambulatory Visit: Payer: Commercial Managed Care - HMO

## 2016-02-11 ENCOUNTER — Ambulatory Visit: Payer: Commercial Managed Care - HMO | Admitting: Internal Medicine

## 2016-02-11 ENCOUNTER — Other Ambulatory Visit: Payer: Commercial Managed Care - HMO

## 2016-02-18 ENCOUNTER — Inpatient Hospital Stay: Payer: Commercial Managed Care - HMO

## 2016-02-18 ENCOUNTER — Inpatient Hospital Stay: Payer: Commercial Managed Care - HMO | Admitting: Hematology and Oncology

## 2016-02-22 ENCOUNTER — Encounter: Payer: Self-pay | Admitting: Family Medicine

## 2016-02-22 ENCOUNTER — Ambulatory Visit (INDEPENDENT_AMBULATORY_CARE_PROVIDER_SITE_OTHER): Payer: Commercial Managed Care - HMO | Admitting: Family Medicine

## 2016-02-22 ENCOUNTER — Other Ambulatory Visit: Payer: Self-pay

## 2016-02-22 VITALS — BP 136/73 | HR 103 | Temp 98.2°F | Wt 129.0 lb

## 2016-02-22 DIAGNOSIS — J410 Simple chronic bronchitis: Secondary | ICD-10-CM | POA: Diagnosis not present

## 2016-02-22 DIAGNOSIS — I10 Essential (primary) hypertension: Secondary | ICD-10-CM | POA: Diagnosis not present

## 2016-02-22 MED ORDER — BENAZEPRIL HCL 40 MG PO TABS: 40.0000 mg | ORAL_TABLET | Freq: Every day | ORAL | 2 refills | Status: DC

## 2016-02-22 NOTE — Progress Notes (Signed)
BP 136/73   Pulse (!) 103   Temp 98.2 F (36.8 C)   Wt 129 lb (58.5 kg)   SpO2 (!) 86%   BMI 19.05 kg/m    Subjective:    Patient ID: Patrick Ayala, male    DOB: [birth year], 75 y.o.   MRN: [id number]  HPI: Patrick Ayala is a 75 y.o. male  Chief Complaint  Patient presents with  . Hypertension  Patient taking Benzapril another blood pressure medicines with no complaints. Good control of blood pressure 03 at walk-in and here today.  COPD stable using oxygen O2 was checked here off oxygen just after he walked in. Patient's current tobacco: Feeling fine.  Relevant past medical, surgical, family and social history reviewed and updated as indicated. Interim medical history since our last visit reviewed. Allergies and medications reviewed and updated.  Review of Systems  Constitutional: Negative.   Respiratory: Negative.   Cardiovascular: Negative.     Per HPI unless specifically indicated above     Objective:    BP 136/73   Pulse (!) 103   Temp 98.2 F (36.8 C)   Wt 129 lb (58.5 kg)   SpO2 (!) 86%   BMI 19.05 kg/m   Wt Readings from Last 3 Encounters:  02/22/16 129 lb (58.5 kg)  01/30/16 131 lb (59.4 kg)  01/20/16 131 lb 3.2 oz (59.5 kg)    Physical Exam  Constitutional: He is oriented to person, place, and time. He appears well-developed and well-nourished. No distress.  HENT:  Head: Normocephalic and atraumatic.  Right Ear: Hearing normal.  Left Ear: Hearing normal.  Nose: Nose normal.  Eyes: Conjunctivae and lids are normal. Right eye exhibits no discharge. Left eye exhibits no discharge. No scleral icterus.  Cardiovascular: Normal rate, regular rhythm and normal heart sounds.   Pulmonary/Chest: Effort normal. No respiratory distress.  Musculoskeletal: Normal range of motion.  Neurological: He is alert and oriented to person, place, and time.  Skin: Skin is intact. No rash noted.  Psychiatric: He has a normal mood and affect. His speech is normal and  behavior is normal. Judgment and thought content normal. Cognition and memory are normal.    Results for orders placed or performed in visit on 01/20/16  LP+ALT+AST Piccolo, [employer]  Result Value Ref Range   ALT (SGPT) Piccolo, Waived 23 10 - 47 U/L   AST (SGOT) Piccolo, Waived 25 11 - 38 U/L   Cholesterol Piccolo, Waived 126 <200 mg/dL   HDL Chol Piccolo, Waived 79 >59 mg/dL   Triglycerides Piccolo,Waived 47 <150 mg/dL   Chol/HDL Ratio Piccolo,Waive 1.6 mg/dL   LDL Chol Calc Piccolo Waived 38 <100 mg/dL   VLDL Chol Calc Piccolo,Waive 9 <30 mg/dL  Basic metabolic panel  Result Value Ref Range   Glucose 91 65 - 99 mg/dL   BUN 7 (L) 8 - 27 mg/dL   Creatinine, Ser 0.73 (L) 0.76 - 1.27 mg/dL   GFR calc non Af Amer 92 >59 mL/min/1.73   GFR calc Af Amer 106 >59 mL/min/1.73   BUN/Creatinine Ratio 10 10 - 24   Sodium 138 134 - 144 mmol/L   Potassium 4.3 3.5 - 5.2 mmol/L   Chloride 92 (L) 96 - 106 mmol/L   CO2 34 (H) 18 - 29 mmol/L   Calcium 9.3 8.6 - 10.2 mg/dL      Assessment & Plan:   Problem List Items Addressed This Visit      Cardiovascular and Mediastinum   Essential  hypertension - Primary    The current medical regimen is effective;  continue present plan and medications.       Relevant Medications   benazepril (LOTENSIN) 40 MG tablet   Other Relevant Orders   Basic metabolic panel     Respiratory   COPD (chronic obstructive pulmonary disease) ([place])    The current medical regimen is effective;  continue present plan and medications.           Follow up plan: Return in about 6 months (around 08/21/2016) for Physical Exam.

## 2016-02-22 NOTE — Assessment & Plan Note (Signed)
The current medical regimen is effective;  continue present plan and medications.  

## 2016-02-23 ENCOUNTER — Encounter: Payer: Self-pay | Admitting: Family Medicine

## 2016-02-23 LAB — BASIC METABOLIC PANEL
BUN/Creatinine Ratio: 13 (ref 10–24)
BUN: 10 mg/dL (ref 8–27)
CHLORIDE: 93 mmol/L — AB (ref 96–106)
CO2: 33 mmol/L — AB (ref 18–29)
Calcium: 9.3 mg/dL (ref 8.6–10.2)
Creatinine, Ser: 0.77 mg/dL (ref 0.76–1.27)
GFR calc Af Amer: 104 mL/min/{1.73_m2} (ref >59)
GFR calc non Af Amer: 90 mL/min/{1.73_m2} (ref >59)
GLUCOSE: 103 mg/dL — AB (ref 65–99)
POTASSIUM: 4.7 mmol/L (ref 3.5–5.2)
SODIUM: 142 mmol/L (ref 134–144)

## 2016-02-25 ENCOUNTER — Encounter: Payer: Self-pay | Admitting: Hematology and Oncology

## 2016-02-25 ENCOUNTER — Inpatient Hospital Stay: Payer: Commercial Managed Care - HMO | Attending: Hematology and Oncology

## 2016-02-25 ENCOUNTER — Inpatient Hospital Stay (HOSPITAL_BASED_OUTPATIENT_CLINIC_OR_DEPARTMENT_OTHER): Payer: Commercial Managed Care - HMO | Admitting: Hematology and Oncology

## 2016-02-25 ENCOUNTER — Inpatient Hospital Stay: Payer: Commercial Managed Care - HMO

## 2016-02-25 VITALS — BP 139/78 | HR 104 | Temp 96.4°F | Resp 20 | Wt 127.6 lb

## 2016-02-25 DIAGNOSIS — Z9049 Acquired absence of other specified parts of digestive tract: Secondary | ICD-10-CM | POA: Insufficient documentation

## 2016-02-25 DIAGNOSIS — Z809 Family history of malignant neoplasm, unspecified: Secondary | ICD-10-CM | POA: Diagnosis not present

## 2016-02-25 DIAGNOSIS — Z9981 Dependence on supplemental oxygen: Secondary | ICD-10-CM | POA: Diagnosis not present

## 2016-02-25 DIAGNOSIS — Z7982 Long term (current) use of aspirin: Secondary | ICD-10-CM

## 2016-02-25 DIAGNOSIS — Z79899 Other long term (current) drug therapy: Secondary | ICD-10-CM | POA: Insufficient documentation

## 2016-02-25 DIAGNOSIS — R062 Wheezing: Secondary | ICD-10-CM | POA: Insufficient documentation

## 2016-02-25 DIAGNOSIS — F17210 Nicotine dependence, cigarettes, uncomplicated: Secondary | ICD-10-CM | POA: Diagnosis not present

## 2016-02-25 DIAGNOSIS — E538 Deficiency of other specified B group vitamins: Secondary | ICD-10-CM

## 2016-02-25 DIAGNOSIS — D696 Thrombocytopenia, unspecified: Secondary | ICD-10-CM

## 2016-02-25 DIAGNOSIS — D751 Secondary polycythemia: Secondary | ICD-10-CM | POA: Insufficient documentation

## 2016-02-25 DIAGNOSIS — J449 Chronic obstructive pulmonary disease, unspecified: Secondary | ICD-10-CM

## 2016-02-25 LAB — CBC WITH DIFFERENTIAL/PLATELET
Basophils Absolute: 0 10*3/uL (ref 0–0.1)
Basophils Relative: 0 %
Eosinophils Absolute: 0.1 10*3/uL (ref 0–0.7)
Eosinophils Relative: 1 %
HCT: 49.6 % (ref 40.0–52.0)
Hemoglobin: 16.6 g/dL (ref 13.0–18.0)
Lymphocytes Relative: 28 %
Lymphs Abs: 2.5 10*3/uL (ref 1.0–3.6)
MCH: 33.1 pg (ref 26.0–34.0)
MCHC: 33.6 g/dL (ref 32.0–36.0)
MCV: 98.6 fL (ref 80.0–100.0)
Monocytes Absolute: 0.8 10*3/uL (ref 0.2–1.0)
Monocytes Relative: 9 %
Neutro Abs: 5.7 10*3/uL (ref 1.4–6.5)
Neutrophils Relative %: 62 %
Platelets: 92 10*3/uL — ABNORMAL LOW (ref 150–440)
RBC: 5.03 MIL/uL (ref 4.40–5.90)
RDW: 13.5 % (ref 11.5–14.5)
WBC: 9.1 10*3/uL (ref 3.8–10.6)

## 2016-02-25 MED ORDER — CYANOCOBALAMIN 1000 MCG/ML IJ SOLN
1000.0000 ug | Freq: Once | INTRAMUSCULAR | Status: AC
  Administered 2016-02-25: 1000 ug via INTRAMUSCULAR
  Filled 2016-02-25: qty 1

## 2016-02-25 NOTE — Progress Notes (Signed)
[facility] day:  02/25/16  Chief Complaint: Patrick Ayala is a 75 y.o. male with secondary polycythemia and thrombocytopenia and B12 deficiency who is seen for monthly B12 injection and possible phlebotomy.  HPI: The patient was last seen in the medical oncology clinic on 10/22/2015.  At that time, he felt fine.  CBC revealed a hematocrit of 51.5, hemoglobin 16.7, MCV 88.3, and platelets 79,000.  He did not undergo phlebotomy.  I encouraged smoking cessation.  CBC on 11/19/2015 revealed a hematocrit of 52.2 and hemoglobin 17.4.  CBC on 12/24/2015 revealed a hematocrit of 51.5 and hemoglobin 17.3.   CBC on 01/14/2016 revealed a hematocrit of 51.6 and a hemoglobin of 17.8.  He underwent phlebotomy of 250 cc on 11/19/2015.  He has continued his monthly B12 (last 12/24/2015).  He is smoking 3/4 th pack a day.    Symptomatically, he notes no problems.  He is on oxygen 3 liters/min.  He is eating "ok".   Past Medical History:  Diagnosis Date  . Carboxyhemoglobinemia   . COPD (chronic obstructive pulmonary disease) ([place])   . Polycythemia, secondary 10/20/2014  . Thrombocytopenia ([place])     Past Surgical History:  Procedure Laterality Date  . APPENDECTOMY    . EYE SURGERY Right    cataract  . HERNIA REPAIR    . polycythemia      Family History  Problem Relation Age of Onset  . Cancer Mother   . Cancer Sister   . Cancer Brother     Social History:  reports that he has been smoking Cigarettes.  He has been smoking about 1.00 pack per day. He has never used smokeless tobacco. He reports that he drinks about 4.2 oz of alcohol per week . He reports that he does not use drugs.  He smokes 3/4th pack per day.  He drinks 2 beers per night.  He lives in [place].  He is alone today.  Allergies:  Allergies  Allergen Reactions  . No Known Allergies     Current Medications: Current Outpatient Prescriptions  Medication Sig Dispense Refill   . albuterol (PROVENTIL HFA;VENTOLIN HFA) 108 (90 Base) MCG/ACT inhaler Inhale 1-2 puffs into the lungs every 6 (six) hours as needed for wheezing or shortness of breath. 1 Inhaler 0  . amLODipine (NORVASC) 5 MG tablet Take 1 tablet (5 mg total) by mouth daily. 90 tablet 2  . aspirin 81 MG tablet ASA 81 mg po once a day (start taking it from 08/02/2013)   Quantity: 0;  Refills: 0  Active    . atorvastatin (LIPITOR) 20 MG tablet Take 1 tablet (20 mg total) by mouth daily. 90 tablet 2  . benazepril (LOTENSIN) 40 MG tablet Take 1 tablet (40 mg total) by mouth daily. 90 tablet 2  . budesonide-formoterol (SYMBICORT) 160-4.5 MCG/ACT inhaler Inhale 2 puffs into the lungs every morning. INHALE 2 TIMES DAILY 30.6 Inhaler 12  . ketorolac (ACULAR) 0.5 % ophthalmic solution      No current facility-administered medications for this visit.     Review of Systems:  GENERAL:  Feels "fine".  "No problems".  No fevers or sweats.  Weight down 2 pounds. PERFORMANCE STATUS (ECOG):  1 HEENT:  Needs dental extractions.  No visual changes, runny nose, sore throat, mouth sores or tenderness. Lungs: Shortness of breath on exertion.  Chronic cough for years.  No hemoptysis. Cardiac:  No chest pain, palpitations, orthopnea, or PND. GI:  No  nausea, vomiting, diarrhea, constipation, melena or hematochezia. GU:  No urgency, frequency, dysuria, or hematuria. Musculoskeletal:  No back pain.  No joint pain.  No muscle tenderness. Extremities:  No pain or swelling. Skin:  No rashes or skin changes. Neuro:  No headache, numbness or weakness, balance or coordination issues. Endocrine:  No diabetes, thyroid issues, hot flashes or night sweats. Psych:  No mood changes, depression or anxiety. Pain:  No focal pain. Review of systems:  All other systems reviewed and found to be negative.  Physical Exam: Blood pressure 139/78, pulse (!) 104, temperature (!) 96.4 F (35.8 C), resp. rate 20, weight 127 lb 10.3 oz (57.9 kg), SpO2  90 %. GENERAL:  Thin gentleman sitting comfortably in the exam room in no acute distress. MENTAL STATUS:  Alert and oriented to person, place and time. HEAD: Wearing a Little Debbie cap.  Gray hair and [employer].  Normocephalic, atraumatic, face symmetric, no Cushingoid features. EYES:  Brown eyes.  Ruddy sclera.  Pupils equal round and reactive to light and accomodation.  No conjunctivitis or scleral icterus. ENT:  Oropharynx clear without lesion.  Poor dentition.  Tongue normal. Mucous membranes moist.  RESPIRATORY:  Right sided wheezes.  Otherwise clear to auscultation without rales or rhonchi. CARDIOVASCULAR:  Regular rate and rhythm without murmur, rub or gallop. ABDOMEN:  Ventral hernia.  Soft, non-tender, with active bowel sounds, and no hepatosplenomegaly.  No masses. SKIN:  Nicotine stained nails.  No rashes, ulcers or lesions. EXTREMITIES: No edema, no skin discoloration or tenderness.  No palpable cords. LYMPH NODES: No palpable cervical, supraclavicular, axillary or inguinal adenopathy  NEUROLOGICAL: Unremarkable. PSYCH:  Appropriate.   Appointment on 02/25/2016  Component Date Value Ref Range Status  . WBC 02/25/2016 9.1  3.8 - 10.6 K/uL Final  . RBC 02/25/2016 5.03  4.40 - 5.90 MIL/uL Final  . Hemoglobin 02/25/2016 16.6  13.0 - 18.0 g/dL Final  . HCT 02/25/2016 49.6  40.0 - 52.0 % Final  . MCV 02/25/2016 98.6  80.0 - 100.0 fL Final  . MCH 02/25/2016 33.1  26.0 - 34.0 pg Final  . MCHC 02/25/2016 33.6  32.0 - 36.0 g/dL Final  . RDW 02/25/2016 13.5  11.5 - 14.5 % Final  . Platelets 02/25/2016 92* 150 - 440 K/uL Final  . Neutrophils Relative % 02/25/2016 62  % Final  . Lymphocytes Relative 02/25/2016 28  % Final  . Monocytes Relative 02/25/2016 9  % Final  . Eosinophils Relative 02/25/2016 1  % Final  . Basophils Relative 02/25/2016 0  % Final  . Neutro Abs 02/25/2016 5.7  1.4 - 6.5 K/uL Final  . Lymphs Abs 02/25/2016 2.5  1.0 - 3.6 K/uL Final  . Monocytes Absolute 02/25/2016  0.8  0.2 - 1.0 K/uL Final  . Eosinophils Absolute 02/25/2016 0.1  0 - 0.7 K/uL Final  . Basophils Absolute 02/25/2016 0.0  0 - 0.1 K/uL Final    Assessment:  Patrick Ayala is a 75 y.o. male with secondary polycythemia and thrombocytopenia of unclear etiology.  He smokes and has a history of an elevated carboxyhemoglobin as high as 23% attributed to smoking in a small unventilated room for many hours and a truck exhaust leak (fixed about a year ago).  He has carbon monoxide  detectors. He smokes 1 pack per day, but is trying to cut back.  Platelet count has ranged from 61,000 to 139,000 without trend since 09/2012.  Prior work-up noted the following normal labs:  erythropoietin level, iron  studies, JAK2, HIV, ANA, and hepatitis C. Ultrasound revealed no splenomegaly.  Chest CT on 06/23/2015 revealed resolution of the left upper lobe pulmonary nodule.   He has had multiple phlebotomies beginning in 09/2012 (last 11/19/2015). He undergoes small phlebotomies (250-300 cc) with supplimental IVF to maintain a hematocrit less than 52.  He has B12 deficiency.  B12 was 260 (low normal) on 12/07/2014.  MMA was elevated c/w B12 deficiency.  He was started on B12 weekly x 6 (02/01/2015 - 03/01/2015) prior to a monthly schedule (last 12/24/2015).  Folate was 16.1 on 11/19/2015.  Symptomatically, he denies any new complaints.  Exam reveals right sided wheezing.  Hematocrit is 49.6.  Platelet count is 92,000.  Plan: 1.  Labs today:  CBC with diff. 2.  Discuss smoking cessation.  Discuss consideration of low dose screening chest CT program.  Patient interested. 3.  No phlebotomy today 4.  B12 shot today. 5.  Burgess Estelle to contact patient regarding enrollment on yearly low dose chest CT program. 6.  RTC monthly for Hgb, B12 injection, and +/- phlebotomy. 7.  RTC in 4 months for MD assessment and labs (CBC with diff, CMP, ferritin, folate), B12 +/- phlebotomy.   Lequita Asal, MD  02/25/2016, 11:15  AM

## 2016-02-25 NOTE — Progress Notes (Signed)
Polycythemia vera follow up, possible phlebotomy, pt reports diarrhea 2-3 times every morning loose watery stools.

## 2016-02-28 ENCOUNTER — Other Ambulatory Visit: Payer: Self-pay | Admitting: *Deleted

## 2016-02-28 DIAGNOSIS — E538 Deficiency of other specified B group vitamins: Secondary | ICD-10-CM

## 2016-02-28 DIAGNOSIS — D751 Secondary polycythemia: Secondary | ICD-10-CM

## 2016-03-24 ENCOUNTER — Telehealth: Payer: Self-pay | Admitting: *Deleted

## 2016-03-24 ENCOUNTER — Inpatient Hospital Stay: Payer: Commercial Managed Care - HMO | Attending: Hematology and Oncology

## 2016-03-24 DIAGNOSIS — Z79899 Other long term (current) drug therapy: Secondary | ICD-10-CM | POA: Insufficient documentation

## 2016-03-24 DIAGNOSIS — E538 Deficiency of other specified B group vitamins: Secondary | ICD-10-CM | POA: Diagnosis not present

## 2016-03-24 MED ORDER — CYANOCOBALAMIN 1000 MCG/ML IJ SOLN
1000.0000 ug | Freq: Once | INTRAMUSCULAR | Status: AC
  Administered 2016-03-24: 1000 ug via INTRAMUSCULAR
  Filled 2016-03-24: qty 1

## 2016-03-24 NOTE — Telephone Encounter (Signed)
Pt in infusion and was telling staff that he needs a different tank for his portable oxygen.  He contacted [facility] and they told him he needs an order from MD for mini on the go evaluation and they will come out and evaluate him fo rthe service. Dr. Inez Pilgrim was the ordering MD and there are no other doctors listed on his info. Asked Dr. Mike Gip if it is ok since he is now her pt and she was agreeable and I fax order to [facility] Serv. [phone].

## 2016-04-21 ENCOUNTER — Inpatient Hospital Stay: Payer: Medicare HMO

## 2016-04-21 ENCOUNTER — Telehealth: Payer: Self-pay

## 2016-04-21 ENCOUNTER — Other Ambulatory Visit: Payer: Self-pay | Admitting: *Deleted

## 2016-04-21 ENCOUNTER — Inpatient Hospital Stay: Payer: Medicare HMO | Attending: Hematology and Oncology

## 2016-04-21 DIAGNOSIS — Z79899 Other long term (current) drug therapy: Secondary | ICD-10-CM | POA: Insufficient documentation

## 2016-04-21 DIAGNOSIS — E538 Deficiency of other specified B group vitamins: Secondary | ICD-10-CM | POA: Diagnosis not present

## 2016-04-21 DIAGNOSIS — D751 Secondary polycythemia: Secondary | ICD-10-CM

## 2016-04-21 LAB — HEMOGLOBIN: Hemoglobin: 15.2 g/dL (ref 13.0–18.0)

## 2016-04-21 MED ORDER — CYANOCOBALAMIN 1000 MCG/ML IJ SOLN
1000.0000 ug | Freq: Once | INTRAMUSCULAR | Status: AC
  Administered 2016-04-21: 1000 ug via INTRAMUSCULAR
  Filled 2016-04-21: qty 1

## 2016-04-21 NOTE — Telephone Encounter (Signed)
appts changed

## 2016-04-21 NOTE — Telephone Encounter (Signed)
  Sure. We can check labs every other month.  M

## 2016-04-21 NOTE — Telephone Encounter (Signed)
Patient received B12 injection today but did not need a phlebotomy.  Since it has been 5 months since last phlebotomy he would like to know if he could skip next months lab/possible phlebotomy but keep the B12 injection.

## 2016-05-19 ENCOUNTER — Inpatient Hospital Stay: Payer: Medicare HMO | Attending: Hematology and Oncology

## 2016-05-19 ENCOUNTER — Other Ambulatory Visit: Payer: Commercial Managed Care - HMO

## 2016-05-19 DIAGNOSIS — Z79899 Other long term (current) drug therapy: Secondary | ICD-10-CM | POA: Diagnosis not present

## 2016-05-19 DIAGNOSIS — E538 Deficiency of other specified B group vitamins: Secondary | ICD-10-CM | POA: Diagnosis not present

## 2016-05-19 MED ORDER — CYANOCOBALAMIN 1000 MCG/ML IJ SOLN
1000.0000 ug | Freq: Once | INTRAMUSCULAR | Status: AC
  Administered 2016-05-19: 1000 ug via INTRAMUSCULAR
  Filled 2016-05-19: qty 1

## 2016-06-14 ENCOUNTER — Telehealth: Payer: Self-pay | Admitting: *Deleted

## 2016-06-14 NOTE — Telephone Encounter (Signed)
Left voicemail for patient notifyng them that it is time to schedule annual low dose lung cancer screening CT scan. Instructed patient to call back to verify information prior to the scan being scheduled.

## 2016-06-16 ENCOUNTER — Inpatient Hospital Stay: Payer: Medicare HMO | Attending: Hematology and Oncology

## 2016-06-16 ENCOUNTER — Inpatient Hospital Stay: Payer: Medicare HMO

## 2016-06-16 ENCOUNTER — Encounter: Payer: Self-pay | Admitting: Hematology and Oncology

## 2016-06-16 ENCOUNTER — Inpatient Hospital Stay (HOSPITAL_BASED_OUTPATIENT_CLINIC_OR_DEPARTMENT_OTHER): Payer: Medicare HMO | Admitting: Hematology and Oncology

## 2016-06-16 VITALS — BP 124/68 | HR 103 | Temp 97.8°F | Resp 18 | Wt 125.9 lb

## 2016-06-16 DIAGNOSIS — Z809 Family history of malignant neoplasm, unspecified: Secondary | ICD-10-CM | POA: Insufficient documentation

## 2016-06-16 DIAGNOSIS — J449 Chronic obstructive pulmonary disease, unspecified: Secondary | ICD-10-CM | POA: Diagnosis not present

## 2016-06-16 DIAGNOSIS — F17210 Nicotine dependence, cigarettes, uncomplicated: Secondary | ICD-10-CM

## 2016-06-16 DIAGNOSIS — E538 Deficiency of other specified B group vitamins: Secondary | ICD-10-CM

## 2016-06-16 DIAGNOSIS — D696 Thrombocytopenia, unspecified: Secondary | ICD-10-CM | POA: Insufficient documentation

## 2016-06-16 DIAGNOSIS — Z79899 Other long term (current) drug therapy: Secondary | ICD-10-CM | POA: Insufficient documentation

## 2016-06-16 DIAGNOSIS — D751 Secondary polycythemia: Secondary | ICD-10-CM

## 2016-06-16 DIAGNOSIS — Z7982 Long term (current) use of aspirin: Secondary | ICD-10-CM

## 2016-06-16 LAB — CBC WITH DIFFERENTIAL/PLATELET
Basophils Absolute: 0 10*3/uL (ref 0–0.1)
Basophils Relative: 1 %
Eosinophils Absolute: 0.1 10*3/uL (ref 0–0.7)
Eosinophils Relative: 1 %
HCT: 50.8 % (ref 40.0–52.0)
Hemoglobin: 17.1 g/dL (ref 13.0–18.0)
Lymphocytes Relative: 20 %
Lymphs Abs: 1.7 10*3/uL (ref 1.0–3.6)
MCH: 33.8 pg (ref 26.0–34.0)
MCHC: 33.6 g/dL (ref 32.0–36.0)
MCV: 100.4 fL — ABNORMAL HIGH (ref 80.0–100.0)
Monocytes Absolute: 0.6 10*3/uL (ref 0.2–1.0)
Monocytes Relative: 7 %
Neutro Abs: 6.2 10*3/uL (ref 1.4–6.5)
Neutrophils Relative %: 71 %
Platelets: 89 10*3/uL — ABNORMAL LOW (ref 150–440)
RBC: 5.06 MIL/uL (ref 4.40–5.90)
RDW: 13.8 % (ref 11.5–14.5)
WBC: 8.6 10*3/uL (ref 3.8–10.6)

## 2016-06-16 LAB — COMPREHENSIVE METABOLIC PANEL
ALT: 18 U/L (ref 17–63)
AST: 29 U/L (ref 15–41)
Albumin: 4.7 g/dL (ref 3.5–5.0)
Alkaline Phosphatase: 66 U/L (ref 38–126)
Anion gap: 6 (ref 5–15)
BUN: 10 mg/dL (ref 6–20)
CO2: 40 mmol/L — ABNORMAL HIGH (ref 22–32)
Calcium: 9.3 mg/dL (ref 8.9–10.3)
Chloride: 90 mmol/L — ABNORMAL LOW (ref 101–111)
Creatinine, Ser: 0.68 mg/dL (ref 0.61–1.24)
GFR calc Af Amer: >60 mL/min (ref >60)
GFR calc non Af Amer: >60 mL/min (ref >60)
Glucose, Bld: 142 mg/dL — ABNORMAL HIGH (ref 65–99)
Potassium: 4.3 mmol/L (ref 3.5–5.1)
Sodium: 136 mmol/L (ref 135–145)
Total Bilirubin: 1.4 mg/dL — ABNORMAL HIGH (ref 0.3–1.2)
Total Protein: 8.1 g/dL (ref 6.5–8.1)

## 2016-06-16 LAB — BILIRUBIN, DIRECT: Bilirubin, Direct: 0.2 mg/dL (ref 0.1–0.5)

## 2016-06-16 LAB — FERRITIN: Ferritin: 62 ng/mL (ref 24–336)

## 2016-06-16 LAB — FOLATE: Folate: 18.9 ng/mL (ref >5.9)

## 2016-06-16 MED ORDER — CYANOCOBALAMIN 1000 MCG/ML IJ SOLN
1000.0000 ug | Freq: Once | INTRAMUSCULAR | Status: AC
  Administered 2016-06-16: 1000 ug via INTRAMUSCULAR
  Filled 2016-06-16: qty 1

## 2016-06-16 NOTE — Progress Notes (Signed)
[facility] day:  06/16/16  Chief Complaint: Patrick Ayala is a 76 y.o. male with secondary polycythemia and thrombocytopenia and B12 deficiency who is seen for 4 month assessment.  HPI: The patient was last seen in the medical oncology clinic on 02/25/2016.  At that time, denied any problems.  Hematocrit was 49.6.  Platelet count was 92,000.  He did not need a phlebotomy.  He received B12.  We discussed smoking cessation.  We discussed consideration of low dose screening chest CT program.  Hemoglobin on 04/21/2016 was 15.2.  He has continued to receive monthly B12 (last 05/19/2016).  Symptomatically, he denies any complaints.  He has cut his smoking down to 1/2 pack a day.   Past Medical History:  Diagnosis Date  . Carboxyhemoglobinemia   . COPD (chronic obstructive pulmonary disease) ([place])   . Polycythemia, secondary 10/20/2014  . Thrombocytopenia ([place])     Past Surgical History:  Procedure Laterality Date  . APPENDECTOMY    . EYE SURGERY Right    cataract  . HERNIA REPAIR    . polycythemia      Family History  Problem Relation Age of Onset  . Cancer Mother   . Cancer Sister   . Cancer Brother     Social History:  reports that he has been smoking Cigarettes.  He has been smoking about 1.00 pack per day. He has never used smokeless tobacco. He reports that he drinks about 4.2 oz of alcohol per week . He reports that he does not use drugs.  He now smokes 1/2 pack per day.  He drinks 2 beers per night.  He lives in [place].  He is alone today.  Allergies:  Allergies  Allergen Reactions  . No Known Allergies     Current Medications: Current Outpatient Prescriptions  Medication Sig Dispense Refill  . albuterol (PROVENTIL HFA;VENTOLIN HFA) 108 (90 Base) MCG/ACT inhaler Inhale 1-2 puffs into the lungs every 6 (six) hours as needed for wheezing or shortness of breath. 1 Inhaler 0  . amLODipine (NORVASC) 5 MG tablet Take 1  tablet (5 mg total) by mouth daily. 90 tablet 2  . aspirin 81 MG tablet ASA 81 mg po once a day (start taking it from 08/02/2013)   Quantity: 0;  Refills: 0  Active    . atorvastatin (LIPITOR) 20 MG tablet Take 1 tablet (20 mg total) by mouth daily. 90 tablet 2  . benazepril (LOTENSIN) 40 MG tablet Take 1 tablet (40 mg total) by mouth daily. 90 tablet 2  . budesonide-formoterol (SYMBICORT) 160-4.5 MCG/ACT inhaler Inhale 2 puffs into the lungs every morning. INHALE 2 TIMES DAILY 30.6 Inhaler 12  . ketorolac (ACULAR) 0.5 % ophthalmic solution      No current facility-administered medications for this visit.     Review of Systems:  GENERAL:  Feels "fine".  "No problems".  No fevers or sweats.  Weight down 2 pounds. PERFORMANCE STATUS (ECOG):  1 HEENT:  Needs dental extractions.  No visual changes, runny nose, sore throat, mouth sores or tenderness. Lungs: Shortness of breath on exertion.  Chronic cough for years.  No hemoptysis. Cardiac:  No chest pain, palpitations, orthopnea, or PND. GI:  No nausea, vomiting, diarrhea, constipation, melena or hematochezia. GU:  Difficulty starting stream.  No urgency, frequency, dysuria, or hematuria. Musculoskeletal:  No back pain.  No joint pain.  No muscle tenderness. Extremities:  No pain or swelling. Skin:  Easy bruising. No  rashes or skin changes. Neuro:  No headache, numbness or weakness, balance or coordination issues. Endocrine:  No diabetes, thyroid issues, hot flashes or night sweats. Psych:  No mood changes, depression or anxiety. No difficulty sleeping. Pain:  No focal pain. Review of systems:  All other systems reviewed and found to be negative.  Physical Exam: Blood pressure 124/68, pulse (!) 103, temperature 97.8 F (36.6 C), temperature source Tympanic, resp. rate 18, weight 125 lb 14.1 oz (57.1 kg). GENERAL:  Thin gentleman sitting comfortably in the exam room in no acute distress. MENTAL STATUS:  Alert and oriented to person, place and  time. HEAD: Wearing a Little Debbie cap.  Gray hair and [employer].  Normocephalic, atraumatic, face symmetric, no Cushingoid features. EYES:  Brown eyes.  Ruddy sclera.  Pupils equal round and reactive to light and accomodation.  No conjunctivitis or scleral icterus. ENT:  Oropharynx clear without lesion. Poor dentition, missing teeth. Tongue normal. Mucous membranes moist.  RESPIRATORY:  On 2L O2. Right sided wheezes.  Otherwise clear to auscultation without rales or rhonchi. CARDIOVASCULAR:  Regular rate and rhythm without murmur, rub or gallop. ABDOMEN:  Ventral hernia.  Soft, non-tender, with active bowel sounds, and no hepatosplenomegaly.  No masses. SKIN:  Nicotine stained nails.  Bruising on left forearm. No rashes, ulcers or lesions. EXTREMITIES: No edema, no skin discoloration or tenderness.  No palpable cords. LYMPH NODES: No palpable cervical, supraclavicular, axillary or inguinal adenopathy  NEUROLOGICAL: Unremarkable. PSYCH:  Appropriate.   Appointment on 06/16/2016  Component Date Value Ref Range Status  . WBC 06/16/2016 8.6  3.8 - 10.6 K/uL Final  . RBC 06/16/2016 5.06  4.40 - 5.90 MIL/uL Final  . Hemoglobin 06/16/2016 17.1  13.0 - 18.0 g/dL Final  . HCT 06/16/2016 50.8  40.0 - 52.0 % Final  . MCV 06/16/2016 100.4* 80.0 - 100.0 fL Final  . MCH 06/16/2016 33.8  26.0 - 34.0 pg Final  . MCHC 06/16/2016 33.6  32.0 - 36.0 g/dL Final  . RDW 06/16/2016 13.8  11.5 - 14.5 % Final  . Platelets 06/16/2016 89* 150 - 440 K/uL Final  . Neutrophils Relative % 06/16/2016 71  % Final  . Neutro Abs 06/16/2016 6.2  1.4 - 6.5 K/uL Final  . Lymphocytes Relative 06/16/2016 20  % Final  . Lymphs Abs 06/16/2016 1.7  1.0 - 3.6 K/uL Final  . Monocytes Relative 06/16/2016 7  % Final  . Monocytes Absolute 06/16/2016 0.6  0.2 - 1.0 K/uL Final  . Eosinophils Relative 06/16/2016 1  % Final  . Eosinophils Absolute 06/16/2016 0.1  0 - 0.7 K/uL Final  . Basophils Relative 06/16/2016 1  % Final  .  Basophils Absolute 06/16/2016 0.0  0 - 0.1 K/uL Final  . Sodium 06/16/2016 136  135 - 145 mmol/L Final  . Potassium 06/16/2016 4.3  3.5 - 5.1 mmol/L Final  . Chloride 06/16/2016 90* 101 - 111 mmol/L Final  . CO2 06/16/2016 40* 22 - 32 mmol/L Final  . Glucose, Bld 06/16/2016 142* 65 - 99 mg/dL Final  . BUN 06/16/2016 10  6 - 20 mg/dL Final  . Creatinine, Ser 06/16/2016 0.68  0.61 - 1.24 mg/dL Final  . Calcium 06/16/2016 9.3  8.9 - 10.3 mg/dL Final  . Total Protein 06/16/2016 8.1  6.5 - 8.1 g/dL Final  . Albumin 06/16/2016 4.7  3.5 - 5.0 g/dL Final  . AST 06/16/2016 29  15 - 41 U/L Final  . ALT 06/16/2016 18  17 - 63  U/L Final  . Alkaline Phosphatase 06/16/2016 66  38 - 126 U/L Final  . Total Bilirubin 06/16/2016 1.4* 0.3 - 1.2 mg/dL Final  . GFR calc non Af Amer 06/16/2016 >60  >60 mL/min Final  . GFR calc Af Amer 06/16/2016 >60  >60 mL/min Final   Comment: (NOTE) The eGFR has been calculated using the CKD EPI equation. This calculation has not been validated in all clinical situations. eGFR's persistently <60 mL/min signify possible Chronic Kidney Disease.   . Anion gap 06/16/2016 6  5 - 15 Final  . Folate 06/16/2016 18.9  >5.9 ng/mL Final  . Ferritin 06/16/2016 62  24 - 336 ng/mL Final    Assessment:  Patrick Ayala is a 76 y.o. male with secondary polycythemia and thrombocytopenia of unclear etiology.  He smokes and has a history of an elevated carboxyhemoglobin as high as 23% attributed to smoking in a small unventilated room for many hours and a truck exhaust leak (fixed about a year ago).  He has carbon monoxide detectors. He currently smokes 1/2 pack per day, for the past 6 weeks, reduced from 1 pack per day previously.  He is contemplating and preparing to quit, has nicotine patches.  Platelet count has ranged from 61,000 to 139,000 without trend since 09/2012.  Prior work-up noted the following normal labs:  erythropoietin level, iron studies, JAK2, HIV, ANA, and hepatitis C.  Ultrasound revealed no splenomegaly. 89,000 today (06/16/2016).  Chest CT on 06/23/2015 revealed resolution of the left upper lobe pulmonary nodule.   He has had multiple phlebotomies beginning in 09/2012 (last 11/19/2015). He undergoes small phlebotomies (250-300 cc) with supplemental IVF to maintain a hematocrit less than 52.  He has B12 deficiency.  B12 was 260 (low normal) on 12/07/2014.  MMA was elevated c/w B12 deficiency.  He was started on B12 weekly x 6 (02/01/2015 - 03/01/2015) prior to a monthly schedule (last 05/19/2016).  Folate was 16.1 on 11/19/2015 and 18.9 on 06/16/2016.  Symptomatically, he denies any new complaints.  Exam reveals right sided wheezing.  Hematocrit is 50.8.  Platelet count is 89,000.  Plan: 1.  Labs today:  CBC with diff, CMP, ferritin, folate. 2.  Discuss continued smoking cessation.  Discuss low dose screening chest CT program.  Patient is interested. 3.  No phlebotomy today 4.  B12 today. 5.  Burgess Estelle to contact patient (again) regarding enrollment on yearly low dose chest CT program. 6.  RTC monthly for B12 injection. 7.  Every other month when he comes in for B12, check Hgb.  Decrease frequency of Hgb checks if patient does not require interval phlebotomies. 8.  RTC in 4 months for MD assessment, labs (CBC with diff, CMP, ferritin, folate), B12, and +/- phlebotomy.  I saw and evaluated the patient, participating in the key portions of the service and reviewing pertinent diagnostic studies and records.  I reviewed the nurse practitioner's note and agree with the findings and the plan.  The assessment and plan were discussed with the patient.  A few questions were asked by the patient and answered.    Lucendia Herrlich, NP   Lequita Asal, MD 06/16/2016, 1:00 PM

## 2016-06-16 NOTE — Progress Notes (Signed)
Patient offers no complaints today.

## 2016-06-21 ENCOUNTER — Telehealth: Payer: Self-pay | Admitting: *Deleted

## 2016-06-21 NOTE — Telephone Encounter (Signed)
Left voicemail for patient notifyng them that it is time to schedule annual low dose lung cancer screening CT scan. Instructed patient to call back to verify information prior to the scan being scheduled.

## 2016-06-22 ENCOUNTER — Telehealth: Payer: Self-pay | Admitting: *Deleted

## 2016-06-22 DIAGNOSIS — Z87891 Personal history of nicotine dependence: Secondary | ICD-10-CM

## 2016-06-22 NOTE — Telephone Encounter (Signed)
Notified patient that annual lung cancer screening low dose CT scan is due. Confirmed that patient is within the age range of 55-77, and asymptomatic, (no signs or symptoms of lung cancer). Patient denies illness that would prevent curative treatment for lung cancer if found. The patient is a current smoker, with a 31 pack year history. The shared decision making visit was done 10/24/13. Patient is agreeable for CT scan being scheduled.

## 2016-06-23 ENCOUNTER — Other Ambulatory Visit: Payer: Commercial Managed Care - HMO

## 2016-06-23 ENCOUNTER — Ambulatory Visit: Payer: Commercial Managed Care - HMO | Admitting: Hematology and Oncology

## 2016-07-03 ENCOUNTER — Ambulatory Visit
Admission: RE | Admit: 2016-07-03 | Discharge: 2016-07-03 | Disposition: A | Discharge status: Home / Self Care | Payer: Medicare HMO | Source: Ambulatory Visit | Attending: Oncology | Admitting: Oncology

## 2016-07-03 DIAGNOSIS — I2510 Atherosclerotic heart disease of native coronary artery without angina pectoris: Secondary | ICD-10-CM | POA: Insufficient documentation

## 2016-07-03 DIAGNOSIS — I700 Atherosclerosis of aorta: Secondary | ICD-10-CM | POA: Insufficient documentation

## 2016-07-03 DIAGNOSIS — Z122 Encounter for screening for malignant neoplasm of respiratory organs: Secondary | ICD-10-CM | POA: Diagnosis not present

## 2016-07-03 DIAGNOSIS — R918 Other nonspecific abnormal finding of lung field: Secondary | ICD-10-CM | POA: Insufficient documentation

## 2016-07-03 DIAGNOSIS — Z87891 Personal history of nicotine dependence: Secondary | ICD-10-CM | POA: Diagnosis not present

## 2016-07-05 ENCOUNTER — Other Ambulatory Visit: Payer: Self-pay | Admitting: Hematology and Oncology

## 2016-07-05 DIAGNOSIS — J410 Simple chronic bronchitis: Secondary | ICD-10-CM

## 2016-07-05 DIAGNOSIS — R918 Other nonspecific abnormal finding of lung field: Secondary | ICD-10-CM

## 2016-07-07 ENCOUNTER — Telehealth: Payer: Self-pay | Admitting: *Deleted

## 2016-07-07 NOTE — Telephone Encounter (Signed)
Notified patient of LDCT lung cancer screening results with recommendation for 6 month follow up imaging. Also notified of incidental findings noted below. Patient verbalizes understanding.   IMPRESSION: 1. Lung-Rads category 3, probably benign findings. Short-term follow-up in 6 months is recommended with repeat low-dose chest CT without contrast (please use the following order, "CT CHEST LCS NODULE FOLLOW-UP W/O CM"). New bilateral upper lobe pulmonary nodules, including a dominant ill-defined left upper lobe pulmonary nodule. 2.  Coronary artery atherosclerosis. Aortic atherosclerosis. 3. Aortic valvular calcifications. Consider echocardiography to evaluation for valvular dysfunction. 4. Pulmonary artery enlargement suggests pulmonary arterial hypertension.

## 2016-07-10 ENCOUNTER — Encounter: Payer: Self-pay | Admitting: Family Medicine

## 2016-07-10 DIAGNOSIS — I700 Atherosclerosis of aorta: Secondary | ICD-10-CM | POA: Insufficient documentation

## 2016-07-10 DIAGNOSIS — I270 Primary pulmonary hypertension: Secondary | ICD-10-CM | POA: Insufficient documentation

## 2016-07-14 ENCOUNTER — Inpatient Hospital Stay: Payer: Medicare HMO | Attending: Hematology and Oncology

## 2016-07-14 DIAGNOSIS — E538 Deficiency of other specified B group vitamins: Secondary | ICD-10-CM | POA: Diagnosis not present

## 2016-07-14 DIAGNOSIS — Z79899 Other long term (current) drug therapy: Secondary | ICD-10-CM | POA: Diagnosis not present

## 2016-07-14 MED ORDER — CYANOCOBALAMIN 1000 MCG/ML IJ SOLN
1000.0000 ug | Freq: Once | INTRAMUSCULAR | Status: AC
  Administered 2016-07-14: 1000 ug via INTRAMUSCULAR
  Filled 2016-07-14: qty 1

## 2016-08-02 ENCOUNTER — Emergency Department
Admission: EM | Admit: 2016-08-02 | Discharge: 2016-08-02 | Discharge status: Against Medical Advice | Payer: Medicare HMO | Attending: Emergency Medicine | Admitting: Emergency Medicine

## 2016-08-02 ENCOUNTER — Ambulatory Visit (INDEPENDENT_AMBULATORY_CARE_PROVIDER_SITE_OTHER)
Admission: EM | Admit: 2016-08-02 | Discharge: 2016-08-02 | Disposition: A | Discharge status: Other Acute Inpatient Hospital | Payer: Medicare HMO | Source: Home / Self Care | Attending: Family Medicine | Admitting: Family Medicine

## 2016-08-02 ENCOUNTER — Emergency Department: Payer: Medicare HMO

## 2016-08-02 ENCOUNTER — Encounter: Payer: Self-pay | Admitting: Emergency Medicine

## 2016-08-02 DIAGNOSIS — J181 Lobar pneumonia, unspecified organism: Secondary | ICD-10-CM | POA: Insufficient documentation

## 2016-08-02 DIAGNOSIS — Z9889 Other specified postprocedural states: Secondary | ICD-10-CM

## 2016-08-02 DIAGNOSIS — I10 Essential (primary) hypertension: Secondary | ICD-10-CM | POA: Insufficient documentation

## 2016-08-02 DIAGNOSIS — R0602 Shortness of breath: Secondary | ICD-10-CM

## 2016-08-02 DIAGNOSIS — Z7982 Long term (current) use of aspirin: Secondary | ICD-10-CM | POA: Insufficient documentation

## 2016-08-02 DIAGNOSIS — R062 Wheezing: Secondary | ICD-10-CM | POA: Diagnosis not present

## 2016-08-02 DIAGNOSIS — J441 Chronic obstructive pulmonary disease with (acute) exacerbation: Secondary | ICD-10-CM | POA: Insufficient documentation

## 2016-08-02 DIAGNOSIS — J189 Pneumonia, unspecified organism: Secondary | ICD-10-CM | POA: Diagnosis not present

## 2016-08-02 DIAGNOSIS — F17210 Nicotine dependence, cigarettes, uncomplicated: Secondary | ICD-10-CM | POA: Insufficient documentation

## 2016-08-02 DIAGNOSIS — R05 Cough: Secondary | ICD-10-CM | POA: Diagnosis not present

## 2016-08-02 DIAGNOSIS — I200 Unstable angina: Secondary | ICD-10-CM | POA: Diagnosis not present

## 2016-08-02 DIAGNOSIS — Z79899 Other long term (current) drug therapy: Secondary | ICD-10-CM | POA: Insufficient documentation

## 2016-08-02 DIAGNOSIS — R079 Chest pain, unspecified: Secondary | ICD-10-CM | POA: Diagnosis not present

## 2016-08-02 LAB — BASIC METABOLIC PANEL
ANION GAP: 6 (ref 5–15)
BUN: 15 mg/dL (ref 6–20)
CALCIUM: 8.6 mg/dL — AB (ref 8.9–10.3)
CO2: 40 mmol/L — AB (ref 22–32)
Chloride: 89 mmol/L — ABNORMAL LOW (ref 101–111)
Creatinine, Ser: 0.46 mg/dL — ABNORMAL LOW (ref 0.61–1.24)
GFR calc Af Amer: >60 mL/min (ref >60)
GLUCOSE: 110 mg/dL — AB (ref 65–99)
Potassium: 4.3 mmol/L (ref 3.5–5.1)
Sodium: 135 mmol/L (ref 135–145)

## 2016-08-02 LAB — CBC WITH DIFFERENTIAL/PLATELET
BASOS ABS: 0.1 10*3/uL (ref 0–0.1)
Basophils Relative: 1 %
EOS PCT: 0 %
Eosinophils Absolute: 0 10*3/uL (ref 0–0.7)
HEMATOCRIT: 42.2 % (ref 40.0–52.0)
Hemoglobin: 13.5 g/dL (ref 13.0–18.0)
LYMPHS ABS: 0.9 10*3/uL — AB (ref 1.0–3.6)
LYMPHS PCT: 10 %
MCH: 31.7 pg (ref 26.0–34.0)
MCHC: 32.1 g/dL (ref 32.0–36.0)
MCV: 98.7 fL (ref 80.0–100.0)
MONO ABS: 1.2 10*3/uL — AB (ref 0.2–1.0)
Monocytes Relative: 14 %
NEUTROS ABS: 6.5 10*3/uL (ref 1.4–6.5)
Neutrophils Relative %: 75 %
PLATELETS: 88 10*3/uL — AB (ref 150–440)
RBC: 4.27 MIL/uL — ABNORMAL LOW (ref 4.40–5.90)
RDW: 13.4 % (ref 11.5–14.5)
WBC: 8.6 10*3/uL (ref 3.8–10.6)

## 2016-08-02 LAB — TROPONIN I: Troponin I: <0.03 ng/mL (ref <0.03)

## 2016-08-02 LAB — BRAIN NATRIURETIC PEPTIDE: B NATRIURETIC PEPTIDE 5: 19 pg/mL (ref 0.0–100.0)

## 2016-08-02 MED ORDER — ASPIRIN 81 MG PO CHEW
324.0000 mg | CHEWABLE_TABLET | Freq: Once | ORAL | Status: AC
  Administered 2016-08-02: 324 mg via ORAL

## 2016-08-02 MED ORDER — METHYLPREDNISOLONE SODIUM SUCC 125 MG IJ SOLR
125.0000 mg | Freq: Once | INTRAMUSCULAR | Status: AC
  Administered 2016-08-02: 125 mg via INTRAVENOUS
  Filled 2016-08-02: qty 2

## 2016-08-02 MED ORDER — LEVOFLOXACIN 750 MG PO TABS: 750.0000 mg | ORAL_TABLET | Freq: Every day | ORAL | 0 refills | Status: DC

## 2016-08-02 MED ORDER — LEVOFLOXACIN IN D5W 750 MG/150ML IV SOLN
750.0000 mg | Freq: Once | INTRAVENOUS | Status: AC
  Administered 2016-08-02: 750 mg via INTRAVENOUS
  Filled 2016-08-02: qty 150

## 2016-08-02 MED ORDER — IPRATROPIUM-ALBUTEROL 0.5-2.5 (3) MG/3ML IN SOLN
3.0000 mL | Freq: Once | RESPIRATORY_TRACT | Status: AC
  Administered 2016-08-02: 3 mL via RESPIRATORY_TRACT

## 2016-08-02 NOTE — ED Triage Notes (Signed)
Pt to ed with c/o chest pain and sob x 2 days.  Pt sent from [facility] for eval of pneumonia. Pt states coughing up copious amounts of brown thick sputum.  Pt alert and oriented.  Placed on cm, pt on o2 at 2 lpm sats 74%, increased o2 to 4 lpm, sats up to 89%.  Pt does not appear to be in distress.  Pt states he feels much better.

## 2016-08-02 NOTE — ED Notes (Signed)
Gave pt blanket and hooked pt up to monitor.

## 2016-08-02 NOTE — ED Provider Notes (Signed)
[facility]    CSN: [id number] Arrival date & time: 08/02/16  1026     History   Chief Complaint Chief Complaint  Patient presents with  . Chest Pain    HPI Patrick Ayala is a 76 y.o. male.   Patient is a 76 year old black male with long-standing history of COPD. He is on oxygen at home 2 L on a regular basis. According to him and his wife he started having belly shortness of breath or chest pain about 3-4 days ago. He's been having this chest pain with shortness of breath. Time. According to the wife yesterday he told her then he was having chest pain she just about the congestion and shortness of breath. Twice had shortness of breath before and according to his wife pulse ox dropping into the mid 70s and 160s he's never had chest pain before with it as well. No previous surgeries operations he denies any heart problems but he is on blood pressure medicine 3 in fact. They deny any family history of coronary artery disease or disease in immediate family. He does still smoke and only spoke by half pack cigarettes a day IV on 2 L of oxygen at home but yesterday was only smoke one half a cigarette does not smoke anything today.  Reviewing patient's medical records he's had appendectomy and eye surgery and hernia repair despite they're saying he's had no surgeries. He also has a history of polycythemia and thrombocytopenia   The history is provided by the patient and the spouse. The history is limited by the condition of the patient. No language interpreter was used.  Chest Pain  Pain location:  L chest Pain quality: throbbing   Pain radiates to:  Does not radiate Pain severity:  Moderate Timing:  Constant Progression:  Waxing and waning Chronicity:  New Context: breathing   Relieved by:  Nothing Worsened by:  Nothing Ineffective treatments:  None tried Associated symptoms: shortness of breath   Shortness of breath:    Severity:  Severe   Duration:  4 days   Timing:   Constant   Progression:  Worsening Risk factors: hypertension and smoking     Past Medical History:  Diagnosis Date  . Carboxyhemoglobinemia   . COPD (chronic obstructive pulmonary disease) ([place])   . Polycythemia, secondary 10/20/2014  . Thrombocytopenia [facility])     Patient Active Problem List   Diagnosis Date Noted  . Aortic atherosclerosis ([place]) 07/10/2016  . Pulmonary hypertension, primary ([place]) 07/10/2016  . BPH (benign prostatic hyperplasia) 07/19/2015  . Cataracts, bilateral 06/28/2015  . Essential hypertension 02/01/2015  . Hyperlipidemia 02/01/2015  . COPD (chronic obstructive pulmonary disease) ([place]) 02/01/2015  . B12 deficiency 01/27/2015  . Personal history of tobacco use, presenting hazards to health 12/21/2014  . Weight loss 12/07/2014  . Thrombocytopenia ([place]) 11/02/2014  . Polycythemia, secondary 10/20/2014    Past Surgical History:  Procedure Laterality Date  . APPENDECTOMY    . EYE SURGERY Right    cataract  . HERNIA REPAIR    . polycythemia         Home Medications    Prior to Admission medications   Medication Sig Start Date End Date Taking? Authorizing Provider  albuterol (PROVENTIL HFA;VENTOLIN HFA) 108 (90 Base) MCG/ACT inhaler Inhale 1-2 puffs into the lungs every 6 (six) hours as needed for wheezing or shortness of breath. 01/30/16  Yes Lorin Picket, PA-C  amLODipine (NORVASC) 5 MG tablet Take 1 tablet (5 mg total) by  mouth daily. 01/20/16  Yes Guadalupe Maple, MD  aspirin 81 MG tablet ASA 81 mg po once a day (start taking it from 08/02/2013)   Quantity: 0;  Refills: 0  Active   Yes Historical Provider, MD  atorvastatin (LIPITOR) 20 MG tablet Take 1 tablet (20 mg total) by mouth daily. 01/20/16  Yes Guadalupe Maple, MD  benazepril (LOTENSIN) 40 MG tablet Take 1 tablet (40 mg total) by mouth daily. 02/22/16  Yes Guadalupe Maple, MD  budesonide-formoterol (SYMBICORT) 160-4.5 MCG/ACT inhaler Inhale 2 puffs into the lungs every morning. INHALE 2  TIMES DAILY 12/30/15  Yes Guadalupe Maple, MD  ketorolac (ACULAR) 0.5 % ophthalmic solution  02/08/16  Yes Historical Provider, MD    Family History Family History  Problem Relation Age of Onset  . Cancer Mother   . Cancer Sister   . Cancer Brother     Social History Social History  Substance Use Topics  . Smoking status: Current Every Day Smoker    Packs/day: 1.00    Types: Cigarettes  . Smokeless tobacco: Never Used     Comment: Smoking History 1(1)Packs per day  . Alcohol use 4.2 oz/week    7 Cans of beer per week     Comment: 1 can of beer a night     Allergies   No known allergies   Review of Systems Review of Systems  Unable to perform ROS: Acuity of condition  Respiratory: Positive for shortness of breath.   Cardiovascular: Positive for chest pain.     Physical Exam Triage Vital Signs ED Triage Vitals  Enc Vitals Group     BP 08/02/16 1037 (!) 145/52     Pulse Rate 08/02/16 1037 (!) 110     Resp --      Temp 08/02/16 1037 98.6 F (37 C)     Temp Source 08/02/16 1037 Oral     SpO2 08/02/16 1037 (!) 84 %     Weight --      Height 08/02/16 1033 _0  (1.753 m)     Head Circumference --      Peak Flow --      Pain Score 08/02/16 1034 8     Pain Loc --      Pain Edu? --      Excl. in [place]? --    No data found.   Updated Vital Signs BP (!) 145/52 (BP Location: Left Arm)   Pulse (!) 110   Temp 98.6 F (37 C) (Oral)   Ht _1  (1.753 m)   SpO2 (!) 84%   Visual Acuity Right Eye Distance:   Left Eye Distance:   Bilateral Distance:    Right Eye Near:   Left Eye Near:    Bilateral Near:     Physical Exam  Constitutional: He appears cachectic. He is active. He has a sickly appearance. No distress.  HENT:  Head: Normocephalic and atraumatic.  Right Ear: Decreased hearing is noted.  Left Ear: Decreased hearing is noted.  Nose: Mucosal edema present.  Mouth/Throat: Abnormal dentition. Dental caries present.  Eyes: Pupils are equal, round, and  reactive to light.  Neck: Normal range of motion. Neck supple.  Cardiovascular: Tachycardia present.  Exam reveals distant heart sounds.   Pulmonary/Chest: He is in respiratory distress. He has decreased breath sounds.  Musculoskeletal: He exhibits no deformity.  Neurological: He is alert. Coordination normal.  Skin: Skin is warm. He is not diaphoretic.  Psychiatric: He has  a normal mood and affect.  Vitals reviewed.    UC Treatments / Results  Labs (all labs ordered are listed, but only abnormal results are displayed) Labs Reviewed - No data to display  EKG  EKG Interpretation None      ED ECG REPORT I, Wynne Rozak H, the attending physician, personally viewed and interpreted this ECG.   Date: 08/02/2016  EKG Time: 10:44:59  Rate: 102  Rhythm: sinus tachycardia  Axis:72  Intervals:none  ST&T Change: T-wave abnormality possible anterior ischemia Radiology No results found.  Procedures Procedures (including critical care time)  Medications Ordered in UC Medications  aspirin chewable tablet 324 mg (324 mg Oral Given 08/02/16 1049)  ipratropium-albuterol (DUONEB) 0.5-2.5 (3) MG/3ML nebulizer solution 3 mL (3 mLs Nebulization Given 08/02/16 1051)     Initial Impression / Assessment and Plan / UC Course  I have reviewed the triage vital signs and the nursing notes.  Pertinent labs & imaging results that were available during my care of the patient were reviewed by me and considered in my medical decision making (see chart for details).  Clinical Course as of Aug 02 1099  Wed Aug 02, 2016  1055 EKG 12-Lead [MH]  1056 EKG 12-Lead [MH]    Clinical Course User Index [MH] Luvenia Redden, PA-C   Patient with abnormal EKG the EKG actually apparently is not that much different from previous one that was done and April 2015 however because the chest pain with shortness of breath and low pulse ox he is being transferred will be transferred to [facility] ED.  Final Clinical  Impressions(s) / UC Diagnoses   Final diagnoses:  Unstable angina ([place])  COPD with exacerbation ([place])    New Prescriptions New Prescriptions   No medications on file     Note: This dictation was prepared with Dragon dictation along with smaller phrase technology. Any transcriptional errors that result from this process are unintentional.   Frederich Cha, MD 08/02/16 1118

## 2016-08-02 NOTE — ED Triage Notes (Signed)
Patient complains of left sided chest pain x 2 days. Patient states that he has been coughing and had congestion. Patient wears 2 liters of oxygen constantly.

## 2016-08-02 NOTE — ED Provider Notes (Signed)
[facility] Emergency Department Provider Note       Time seen: ----------------------------------------- 12:23 PM on 08/02/2016 -----------------------------------------     I have reviewed the triage vital signs and the nursing notes.   HISTORY   Chief Complaint Shortness of Breath    HPI Patrick Ayala is a 76 y.o. male who presents to the ED for chest tightness and shortness of breath 2 days. Patient states clinically feels like he has pneumonia. Patient was sent from [facility] urgent care for evaluation of possible pneumonia. He has a productive cough with brown thick sputum. He arrives alert and oriented with saturations reportedly 74%. Patient was not in any distress, received DuoNeb in route which seemed to help him quite a bit.   Past Medical History:  Diagnosis Date  . Carboxyhemoglobinemia   . COPD (chronic obstructive pulmonary disease) ([place])   . Polycythemia, secondary 10/20/2014  . Thrombocytopenia [facility])     Patient Active Problem List   Diagnosis Date Noted  . Aortic atherosclerosis ([place]) 07/10/2016  . Pulmonary hypertension, primary ([place]) 07/10/2016  . BPH (benign prostatic hyperplasia) 07/19/2015  . Cataracts, bilateral 06/28/2015  . Essential hypertension 02/01/2015  . Hyperlipidemia 02/01/2015  . COPD (chronic obstructive pulmonary disease) ([place]) 02/01/2015  . B12 deficiency 01/27/2015  . Personal history of tobacco use, presenting hazards to health 12/21/2014  . Weight loss 12/07/2014  . Thrombocytopenia ([place]) 11/02/2014  . Polycythemia, secondary 10/20/2014    Past Surgical History:  Procedure Laterality Date  . APPENDECTOMY    . EYE SURGERY Right    cataract  . HERNIA REPAIR    . polycythemia      Allergies No known allergies  Social History Social History  Substance Use Topics  . Smoking status: Current Every Day Smoker    Packs/day: 1.00    Types: Cigarettes  . Smokeless tobacco: Never Used     Comment:  Smoking History 1(1)Packs per day  . Alcohol use 4.2 oz/week    7 Cans of beer per week     Comment: 1 can of beer a night    Review of Systems Constitutional: Negative for fever. Eyes: Negative for vision changes ENT:  Negative for congestion, sore throat Cardiovascular: Positive for chest tightness Respiratory: Positive shortness of breath, cough with sputum production Gastrointestinal: Negative for abdominal pain, vomiting and diarrhea. Genitourinary: Negative for dysuria. Musculoskeletal: Negative for back pain. Skin: Negative for rash. Neurological: Negative for headaches, focal weakness or numbness.  All systems negative/normal/unremarkable except as stated in the HPI  ____________________________________________   PHYSICAL EXAM:  VITAL SIGNS: ED Triage Vitals  Enc Vitals Group     BP 08/02/16 1144 (!) 133/59     Pulse Rate 08/02/16 1144 95     Resp 08/02/16 1144 (!) 22     Temp --      Temp Source 08/02/16 1144 Oral     SpO2 08/02/16 1144 (!) 76 %     Weight 08/02/16 1145 129 lb (58.5 kg)     Height 08/02/16 1145 _0  (1.753 m)     Head Circumference --      Peak Flow --      Pain Score 08/02/16 1143 0     Pain Loc --      Pain Edu? --      Excl. in [place]? --     Constitutional: Alert and oriented. Well appearing and in no distress. Eyes: Conjunctivae are normal. PERRL. Normal extraocular movements. ENT   Head:  Normocephalic and atraumatic.   Nose: No congestion/rhinnorhea.   Mouth/Throat: Mucous membranes are moist.   Neck: No stridor. Cardiovascular: Normal rate, regular rhythm. No murmurs, rubs, or gallops. Respiratory: Normal respiratory effort with mild tachypnea. Grossly clear at this time. Gastrointestinal: Soft and nontender. Normal bowel sounds Musculoskeletal: Nontender with normal range of motion in extremities. No lower extremity tenderness nor edema. Neurologic:  Normal speech and language. No gross focal neurologic deficits are  appreciated.  Skin:  Skin is warm, dry and intact. No rash noted. Psychiatric: Mood and affect are normal. Speech and behavior are normal.  ____________________________________________  EKG: Interpreted by me. Sinus rhythm rate 85 bpm, normal PR interval, normal QRS, normal QT, normal axis.  ____________________________________________  ED COURSE:  Pertinent labs & imaging results that were available during my care of the patient were reviewed by me and considered in my medical decision making (see chart for details). Patient presents for chest tightness and shortness of breath, we will assess with labs and imaging as indicated. Patient clinically with COPD exacerbation   Procedures ____________________________________________   LABS (pertinent positives/negatives)  Labs Reviewed  CBC WITH DIFFERENTIAL/PLATELET - Abnormal; Notable for the following:       Result Value   RBC 4.27 (*)    Platelets 88 (*)    Lymphs Abs 0.9 (*)    Monocytes Absolute 1.2 (*)    All other components within normal limits  BASIC METABOLIC PANEL - Abnormal; Notable for the following:    Chloride 89 (*)    CO2 40 (*)    Glucose, Bld 110 (*)    Creatinine, Ser 0.46 (*)    Calcium 8.6 (*)    All other components within normal limits  BRAIN NATRIURETIC PEPTIDE  TROPONIN I    RADIOLOGY Images were viewed by me  Chest x-ray IMPRESSION: 1. Left lower lobe consolidation, most consistent with pneumonia. The possibility of a centrally obstructing lesion is excluded on 07/03/2016. 2. Small left parapneumonic effusion. 3. Minimal right basilar atelectasis. 4. Emphysema. ____________________________________________  FINAL ASSESSMENT AND PLAN  Left lower lobe pneumonia, COPD exacerbation  Plan: Patient's labs and imaging were dictated above. Patient had presented for shortness of breath and chest pain that seems to be located around where his pneumonia is. He is hypoxic on 2 L of oxygen which is his  basal rate. He is requiring 3 if not 4 L and refuses to stay in the hospital despite multiple attempts to admit him at least for observation. He did receive IV Levaquin. He is leaving [place].   [name] Newport, MD   Note: This note was generated in part or whole with voice recognition software. Voice recognition is usually quite accurate but there are transcription errors that can and very often do occur. I apologize for any typographical errors that were not detected and corrected.     [name] Newport, MD 08/02/16 [phone]

## 2016-08-11 ENCOUNTER — Other Ambulatory Visit: Payer: Self-pay | Admitting: Family Medicine

## 2016-08-11 ENCOUNTER — Inpatient Hospital Stay: Payer: Medicare HMO

## 2016-08-11 DIAGNOSIS — I10 Essential (primary) hypertension: Secondary | ICD-10-CM

## 2016-08-11 NOTE — Telephone Encounter (Signed)
Raquel Sarna with [facility] called to see if Dr Jeananne Rama received the fax on the patients Benazepril 25m   [phone] Thanks

## 2016-08-14 MED ORDER — BENAZEPRIL HCL 40 MG PO TABS: 40.0000 mg | ORAL_TABLET | Freq: Every day | ORAL | 2 refills | Status: DC

## 2016-08-14 NOTE — Telephone Encounter (Signed)
Last OV: 02/22/16 Next OV: 09/05/16  BMP Latest Ref Rng & Units 08/02/2016 06/16/2016 02/22/2016  Glucose 65 - 99 mg/dL 110(H) 142(H) 103(H)  BUN 6 - 20 mg/dL _0 Creatinine 0.61 - 1.24 mg/dL 0.46(L) 0.68 0.77  BUN/Creat Ratio 10 - 24 - - 13  Sodium 135 - 145 mmol/L 135 136 142  Potassium 3.5 - 5.1 mmol/L 4.3 4.3 4.7  Chloride 101 - 111 mmol/L 89(L) 90(L) 93(L)  CO2 22 - 32 mmol/L 40(H) 40(H) 33(H)  Calcium 8.9 - 10.3 mg/dL 8.6(L) 9.3 9.3

## 2016-08-15 ENCOUNTER — Inpatient Hospital Stay: Payer: Medicare HMO | Attending: Hematology and Oncology

## 2016-08-15 ENCOUNTER — Inpatient Hospital Stay: Payer: Medicare HMO

## 2016-08-15 DIAGNOSIS — E538 Deficiency of other specified B group vitamins: Secondary | ICD-10-CM | POA: Diagnosis not present

## 2016-08-15 DIAGNOSIS — Z79899 Other long term (current) drug therapy: Secondary | ICD-10-CM | POA: Diagnosis not present

## 2016-08-15 DIAGNOSIS — D751 Secondary polycythemia: Secondary | ICD-10-CM

## 2016-08-15 DIAGNOSIS — D696 Thrombocytopenia, unspecified: Secondary | ICD-10-CM

## 2016-08-15 LAB — HEMOGLOBIN: Hemoglobin: 13.4 g/dL (ref 13.0–18.0)

## 2016-08-15 MED ORDER — CYANOCOBALAMIN 1000 MCG/ML IJ SOLN
1000.0000 ug | Freq: Once | INTRAMUSCULAR | Status: AC
  Administered 2016-08-15: 1000 ug via INTRAMUSCULAR
  Filled 2016-08-15: qty 1

## 2016-08-18 ENCOUNTER — Telehealth: Payer: Self-pay | Admitting: Family Medicine

## 2016-08-18 NOTE — Telephone Encounter (Signed)
Called pt to schedule Annual Wellness Visit with Nurse Health Advisor for 5/17 or 5/18:  - knb

## 2016-08-22 ENCOUNTER — Encounter: Payer: Commercial Managed Care - HMO | Admitting: Family Medicine

## 2016-09-05 ENCOUNTER — Encounter: Payer: Self-pay | Admitting: Family Medicine

## 2016-09-08 ENCOUNTER — Inpatient Hospital Stay: Payer: Medicare HMO | Attending: Hematology and Oncology

## 2016-09-08 DIAGNOSIS — Z79899 Other long term (current) drug therapy: Secondary | ICD-10-CM | POA: Diagnosis not present

## 2016-09-08 DIAGNOSIS — E538 Deficiency of other specified B group vitamins: Secondary | ICD-10-CM | POA: Diagnosis not present

## 2016-09-08 MED ORDER — CYANOCOBALAMIN 1000 MCG/ML IJ SOLN
1000.0000 ug | Freq: Once | INTRAMUSCULAR | Status: AC
  Administered 2016-09-08: 1000 ug via INTRAMUSCULAR
  Filled 2016-09-08: qty 1

## 2016-09-19 ENCOUNTER — Encounter: Payer: Self-pay | Admitting: Family Medicine

## 2016-09-19 ENCOUNTER — Ambulatory Visit (INDEPENDENT_AMBULATORY_CARE_PROVIDER_SITE_OTHER): Payer: Medicare HMO | Admitting: Family Medicine

## 2016-09-19 VITALS — BP 128/71 | HR 103 | Ht 70.47 in | Wt 121.0 lb

## 2016-09-19 DIAGNOSIS — I10 Essential (primary) hypertension: Secondary | ICD-10-CM | POA: Diagnosis not present

## 2016-09-19 DIAGNOSIS — N400 Enlarged prostate without lower urinary tract symptoms: Secondary | ICD-10-CM

## 2016-09-19 DIAGNOSIS — E538 Deficiency of other specified B group vitamins: Secondary | ICD-10-CM | POA: Diagnosis not present

## 2016-09-19 DIAGNOSIS — R634 Abnormal weight loss: Secondary | ICD-10-CM

## 2016-09-19 DIAGNOSIS — J410 Simple chronic bronchitis: Secondary | ICD-10-CM

## 2016-09-19 DIAGNOSIS — I270 Primary pulmonary hypertension: Secondary | ICD-10-CM | POA: Diagnosis not present

## 2016-09-19 DIAGNOSIS — Z131 Encounter for screening for diabetes mellitus: Secondary | ICD-10-CM | POA: Diagnosis not present

## 2016-09-19 DIAGNOSIS — I700 Atherosclerosis of aorta: Secondary | ICD-10-CM | POA: Diagnosis not present

## 2016-09-19 DIAGNOSIS — Z125 Encounter for screening for malignant neoplasm of prostate: Secondary | ICD-10-CM | POA: Diagnosis not present

## 2016-09-19 DIAGNOSIS — E7800 Pure hypercholesterolemia, unspecified: Secondary | ICD-10-CM

## 2016-09-19 DIAGNOSIS — Z1329 Encounter for screening for other suspected endocrine disorder: Secondary | ICD-10-CM

## 2016-09-19 DIAGNOSIS — E785 Hyperlipidemia, unspecified: Secondary | ICD-10-CM | POA: Diagnosis not present

## 2016-09-19 DIAGNOSIS — D696 Thrombocytopenia, unspecified: Secondary | ICD-10-CM

## 2016-09-19 DIAGNOSIS — Z0000 Encounter for general adult medical examination without abnormal findings: Secondary | ICD-10-CM

## 2016-09-19 DIAGNOSIS — Z7189 Other specified counseling: Secondary | ICD-10-CM

## 2016-09-19 LAB — URINALYSIS, ROUTINE W REFLEX MICROSCOPIC
BILIRUBIN UA: NEGATIVE
GLUCOSE, UA: NEGATIVE
LEUKOCYTES UA: NEGATIVE
NITRITE UA: NEGATIVE
RBC UA: NEGATIVE
Specific Gravity, UA: 1.02 (ref 1.005–1.030)
Urobilinogen, Ur: 4 mg/dL — ABNORMAL HIGH (ref 0.2–1.0)
pH, UA: 6.5 (ref 5.0–7.5)

## 2016-09-19 LAB — MICROSCOPIC EXAMINATION
Bacteria, UA: NONE SEEN
RBC MICROSCOPIC, UA: NONE SEEN /HPF (ref 0–?)
WBC, UA: NONE SEEN /hpf (ref 0–?)

## 2016-09-19 MED ORDER — ATORVASTATIN CALCIUM 20 MG PO TABS: 20.0000 mg | ORAL_TABLET | Freq: Every day | ORAL | 4 refills | Status: DC

## 2016-09-19 MED ORDER — BUDESONIDE-FORMOTEROL FUMARATE 160-4.5 MCG/ACT IN AERO: 2.0000 | INHALATION_SPRAY | Freq: Every morning | RESPIRATORY_TRACT | 4 refills | Status: DC

## 2016-09-19 MED ORDER — BENAZEPRIL HCL 40 MG PO TABS: 40.0000 mg | ORAL_TABLET | Freq: Every day | ORAL | 4 refills | Status: DC

## 2016-09-19 MED ORDER — ALBUTEROL SULFATE HFA 108 (90 BASE) MCG/ACT IN AERS: 1.0000 | INHALATION_SPRAY | Freq: Four times a day (QID) | RESPIRATORY_TRACT | 2 refills | Status: DC | PRN

## 2016-09-19 MED ORDER — AMLODIPINE BESYLATE 5 MG PO TABS: 5.0000 mg | ORAL_TABLET | Freq: Every day | ORAL | 4 refills | Status: DC

## 2016-09-19 NOTE — Assessment & Plan Note (Signed)
stable

## 2016-09-19 NOTE — Assessment & Plan Note (Signed)
The current medical regimen is effective;  continue present plan and medications.

## 2016-09-19 NOTE — Assessment & Plan Note (Signed)
Discuss weight loss diet nutrition also discussed with wife.

## 2016-09-19 NOTE — Assessment & Plan Note (Signed)
No sx

## 2016-09-19 NOTE — Assessment & Plan Note (Signed)
The current medical regimen is effective;  continue present plan and medications.  

## 2016-09-19 NOTE — Assessment & Plan Note (Signed)
A voluntary discussion about advance care planning including the explanation and discussion of advance directives was extensively discussed  with the patient.  Explanation about the health care proxy and Living will was reviewed and packet with forms with explanation of how to fill them out was given.    

## 2016-09-19 NOTE — Assessment & Plan Note (Signed)
Good shot at the cancer center once a month for B12

## 2016-09-19 NOTE — Assessment & Plan Note (Signed)
On O2

## 2016-09-19 NOTE — Assessment & Plan Note (Signed)
Stable

## 2016-09-19 NOTE — Progress Notes (Signed)
BP 128/71   Pulse (!) 103   Ht 5' 10.47" (1.79 m)   Wt 121 lb (54.9 kg)   SpO2 (!) 85%   BMI 17.13 kg/m    Subjective:    Patient ID: Patrick Ayala, male    DOB: Nov 30, 1940, 76 y.o.   MRN: [id number]  HPI: Patrick Ayala is a 76 y.o. male  Annual exam AWV metrics met  Patient follow-up accompanied by his wife who assists with history patient all in all doing well using oxygen for COPD. Takes Lipitor for cholesterol without problems. Blood pressure medicines doing well with good control. Using Symbicort without problems is out of albuterol. Also discussed diet nutrition patient still losing weight  Relevant past medical, surgical, family and social history reviewed and updated as indicated. Interim medical history since our last visit reviewed. Allergies and medications reviewed and updated.  Review of Systems  Constitutional: Negative.   HENT: Negative.   Eyes: Negative.   Respiratory: Negative.   Cardiovascular: Negative.   Gastrointestinal: Negative.   Endocrine: Negative.   Genitourinary: Negative.   Musculoskeletal: Negative.   Skin: Negative.   Allergic/Immunologic: Negative.   Neurological: Negative.   Hematological: Negative.   Psychiatric/Behavioral: Negative.     Per HPI unless specifically indicated above     Objective:    BP 128/71   Pulse (!) 103   Ht 5' 10.47" (1.79 m)   Wt 121 lb (54.9 kg)   SpO2 (!) 85%   BMI 17.13 kg/m   Wt Readings from Last 3 Encounters:  09/19/16 121 lb (54.9 kg)  08/02/16 129 lb (58.5 kg)  07/03/16 124 lb (56.2 kg)    Physical Exam  Constitutional: He is oriented to person, place, and time. He appears well-developed and well-nourished.  HENT:  Head: Normocephalic.  Right Ear: External ear normal.  Left Ear: External ear normal.  Nose: Nose normal.  Eyes: Conjunctivae and EOM are normal. Pupils are equal, round, and reactive to light.  Neck: Normal range of motion. Neck supple. No thyromegaly present.    Cardiovascular: Normal rate, regular rhythm, normal heart sounds and intact distal pulses.   Pulmonary/Chest: Effort normal and breath sounds normal.  Abdominal: Soft. Bowel sounds are normal. There is no splenomegaly or hepatomegaly.  Genitourinary: Penis normal.  Genitourinary Comments: Not examined as has loose stools tocay  Pt request   Musculoskeletal: Normal range of motion.  Lymphadenopathy:    He has no cervical adenopathy.  Neurological: He is alert and oriented to person, place, and time. He has normal reflexes.  Skin: Skin is warm and dry.  Psychiatric: He has a normal mood and affect. His behavior is normal. Judgment and thought content normal.    Results for orders placed or performed in visit on 08/15/16  Hemoglobin  Result Value Ref Range   Hemoglobin 13.4 13.0 - 18.0 g/dL      Assessment & Plan:   Problem List Items Addressed This Visit      Cardiovascular and Mediastinum   Essential hypertension    The current medical regimen is effective;  continue present plan and medications.       Relevant Medications   amLODipine (NORVASC) 5 MG tablet   atorvastatin (LIPITOR) 20 MG tablet   benazepril (LOTENSIN) 40 MG tablet   Other Relevant Orders   CBC with Differential/Platelet   Urinalysis, Routine w reflex microscopic   Aortic atherosclerosis (HCC)    No sx      Relevant Medications  amLODipine (NORVASC) 5 MG tablet   atorvastatin (LIPITOR) 20 MG tablet   benazepril (LOTENSIN) 40 MG tablet   Pulmonary hypertension, primary (HCC)    stable      Relevant Medications   amLODipine (NORVASC) 5 MG tablet   atorvastatin (LIPITOR) 20 MG tablet   benazepril (LOTENSIN) 40 MG tablet     Respiratory   COPD (chronic obstructive pulmonary disease) (HCC)    On O2      Relevant Medications   albuterol (PROVENTIL HFA;VENTOLIN HFA) 108 (90 Base) MCG/ACT inhaler   budesonide-formoterol (SYMBICORT) 160-4.5 MCG/ACT inhaler     Genitourinary   BPH (benign  prostatic hyperplasia)    Stable         Other   Thrombocytopenia (HCC)    stable      Weight loss    Discuss weight loss diet nutrition also discussed with wife.      B12 deficiency    Good shot at the cancer center once a month for B12      Hyperlipidemia    The current medical regimen is effective;  continue present plan and medications.       Relevant Medications   amLODipine (NORVASC) 5 MG tablet   atorvastatin (LIPITOR) 20 MG tablet   benazepril (LOTENSIN) 40 MG tablet   Other Relevant Orders   Lipid panel   Advanced care planning/counseling discussion    A voluntary discussion about advance care planning including the explanation and discussion of advance directives was extensively discussed  with the patient.  Explanation about the health care proxy and Living will was reviewed and packet with forms with explanation of how to fill them out was given.        Other Visit Diagnoses    Annual physical exam    -  Primary   Prostate cancer screening       Relevant Orders   PSA   Thyroid disorder screen       Relevant Orders   TSH   Screening for diabetes mellitus (DM)       Relevant Orders   Comprehensive metabolic panel   Urinalysis, Routine w reflex microscopic       Follow up plan: Return in about 6 months (around 03/21/2017) for BMP,  Lipids, ALT, AST.

## 2016-09-20 ENCOUNTER — Encounter: Payer: Self-pay | Admitting: Family Medicine

## 2016-09-20 LAB — COMPREHENSIVE METABOLIC PANEL
ALBUMIN: 4.8 g/dL (ref 3.5–4.8)
ALK PHOS: 75 IU/L (ref 39–117)
ALT: 13 IU/L (ref 0–44)
AST: 22 IU/L (ref 0–40)
Albumin/Globulin Ratio: 1.8 (ref 1.2–2.2)
BUN / CREAT RATIO: 23 (ref 10–24)
BUN: 15 mg/dL (ref 8–27)
Bilirubin Total: 1.2 mg/dL (ref 0.0–1.2)
CO2: 34 mmol/L — AB (ref 20–29)
CREATININE: 0.64 mg/dL — AB (ref 0.76–1.27)
Calcium: 9.3 mg/dL (ref 8.6–10.2)
Chloride: 90 mmol/L — ABNORMAL LOW (ref 96–106)
GFR calc non Af Amer: 96 mL/min/{1.73_m2} (ref >59)
GFR, EST AFRICAN AMERICAN: 112 mL/min/{1.73_m2} (ref >59)
GLOBULIN, TOTAL: 2.7 g/dL (ref 1.5–4.5)
Glucose: 86 mg/dL (ref 65–99)
Potassium: 4.8 mmol/L (ref 3.5–5.2)
SODIUM: 138 mmol/L (ref 134–144)
Total Protein: 7.5 g/dL (ref 6.0–8.5)

## 2016-09-20 LAB — CBC WITH DIFFERENTIAL/PLATELET
Basophils Absolute: 0 10*3/uL (ref 0.0–0.2)
Basos: 0 %
EOS (ABSOLUTE): 0.1 10*3/uL (ref 0.0–0.4)
Eos: 1 %
Hematocrit: 47.2 % (ref 37.5–51.0)
Hemoglobin: 16 g/dL (ref 13.0–17.7)
IMMATURE GRANS (ABS): 0 10*3/uL (ref 0.0–0.1)
Immature Granulocytes: 0 %
LYMPHS: 20 %
Lymphocytes Absolute: 1.9 10*3/uL (ref 0.7–3.1)
MCH: 32.9 pg (ref 26.6–33.0)
MCHC: 33.9 g/dL (ref 31.5–35.7)
MCV: 97 fL (ref 79–97)
MONOCYTES: 9 %
Monocytes Absolute: 0.8 10*3/uL (ref 0.1–0.9)
NEUTROS PCT: 70 %
Neutrophils Absolute: 6.5 10*3/uL (ref 1.4–7.0)
PLATELETS: 118 10*3/uL — AB (ref 150–379)
RBC: 4.87 x10E6/uL (ref 4.14–5.80)
RDW: 14.6 % (ref 12.3–15.4)
WBC: 9.3 10*3/uL (ref 3.4–10.8)

## 2016-09-20 LAB — LIPID PANEL
CHOLESTEROL TOTAL: 152 mg/dL (ref 100–199)
Chol/HDL Ratio: 1.7 ratio (ref 0.0–5.0)
HDL: 90 mg/dL (ref >39)
LDL Calculated: 47 mg/dL (ref 0–99)
Triglycerides: 74 mg/dL (ref 0–149)
VLDL CHOLESTEROL CAL: 15 mg/dL (ref 5–40)

## 2016-09-20 LAB — PSA: PROSTATE SPECIFIC AG, SERUM: 0.8 ng/mL (ref 0.0–4.0)

## 2016-09-20 LAB — TSH: TSH: 1.12 u[IU]/mL (ref 0.450–4.500)

## 2016-10-16 ENCOUNTER — Inpatient Hospital Stay: Payer: Medicare HMO | Attending: Hematology and Oncology

## 2016-10-16 ENCOUNTER — Encounter: Payer: Self-pay | Admitting: Hematology and Oncology

## 2016-10-16 ENCOUNTER — Inpatient Hospital Stay: Payer: Medicare HMO

## 2016-10-16 ENCOUNTER — Inpatient Hospital Stay (HOSPITAL_BASED_OUTPATIENT_CLINIC_OR_DEPARTMENT_OTHER): Payer: Medicare HMO | Admitting: Hematology and Oncology

## 2016-10-16 VITALS — BP 120/70 | HR 94 | Temp 97.9°F | Resp 18 | Wt 121.4 lb

## 2016-10-16 DIAGNOSIS — Z79899 Other long term (current) drug therapy: Secondary | ICD-10-CM

## 2016-10-16 DIAGNOSIS — Z7982 Long term (current) use of aspirin: Secondary | ICD-10-CM | POA: Diagnosis not present

## 2016-10-16 DIAGNOSIS — E538 Deficiency of other specified B group vitamins: Secondary | ICD-10-CM

## 2016-10-16 DIAGNOSIS — D696 Thrombocytopenia, unspecified: Secondary | ICD-10-CM

## 2016-10-16 DIAGNOSIS — F17210 Nicotine dependence, cigarettes, uncomplicated: Secondary | ICD-10-CM

## 2016-10-16 DIAGNOSIS — I700 Atherosclerosis of aorta: Secondary | ICD-10-CM | POA: Diagnosis not present

## 2016-10-16 DIAGNOSIS — I2510 Atherosclerotic heart disease of native coronary artery without angina pectoris: Secondary | ICD-10-CM

## 2016-10-16 DIAGNOSIS — Z808 Family history of malignant neoplasm of other organs or systems: Secondary | ICD-10-CM

## 2016-10-16 DIAGNOSIS — J449 Chronic obstructive pulmonary disease, unspecified: Secondary | ICD-10-CM

## 2016-10-16 DIAGNOSIS — D751 Secondary polycythemia: Secondary | ICD-10-CM

## 2016-10-16 DIAGNOSIS — R918 Other nonspecific abnormal finding of lung field: Secondary | ICD-10-CM | POA: Insufficient documentation

## 2016-10-16 LAB — CBC WITH DIFFERENTIAL/PLATELET
Basophils Absolute: 0 10*3/uL (ref 0–0.1)
Basophils Relative: 1 %
Eosinophils Absolute: 0.1 10*3/uL (ref 0–0.7)
Eosinophils Relative: 1 %
HCT: 42.1 % (ref 40.0–52.0)
Hemoglobin: 14.5 g/dL (ref 13.0–18.0)
Lymphocytes Relative: 28 %
Lymphs Abs: 2.3 10*3/uL (ref 1.0–3.6)
MCH: 33.1 pg (ref 26.0–34.0)
MCHC: 34.4 g/dL (ref 32.0–36.0)
MCV: 96.2 fL (ref 80.0–100.0)
Monocytes Absolute: 0.7 10*3/uL (ref 0.2–1.0)
Monocytes Relative: 9 %
Neutro Abs: 4.9 10*3/uL (ref 1.4–6.5)
Neutrophils Relative %: 61 %
Platelets: 99 10*3/uL — ABNORMAL LOW (ref 150–440)
RBC: 4.37 MIL/uL — ABNORMAL LOW (ref 4.40–5.90)
RDW: 14.6 % — ABNORMAL HIGH (ref 11.5–14.5)
WBC: 8.1 10*3/uL (ref 3.8–10.6)

## 2016-10-16 LAB — COMPREHENSIVE METABOLIC PANEL
ALT: 19 U/L (ref 17–63)
AST: 33 U/L (ref 15–41)
Albumin: 4.2 g/dL (ref 3.5–5.0)
Alkaline Phosphatase: 55 U/L (ref 38–126)
Anion gap: 7 (ref 5–15)
BUN: 14 mg/dL (ref 6–20)
CO2: 35 mmol/L — ABNORMAL HIGH (ref 22–32)
Calcium: 9.3 mg/dL (ref 8.9–10.3)
Chloride: 96 mmol/L — ABNORMAL LOW (ref 101–111)
Creatinine, Ser: 0.66 mg/dL (ref 0.61–1.24)
GFR calc Af Amer: >60 mL/min (ref >60)
GFR calc non Af Amer: >60 mL/min (ref >60)
Glucose, Bld: 86 mg/dL (ref 65–99)
Potassium: 4.3 mmol/L (ref 3.5–5.1)
Sodium: 138 mmol/L (ref 135–145)
Total Bilirubin: 0.9 mg/dL (ref 0.3–1.2)
Total Protein: 7.1 g/dL (ref 6.5–8.1)

## 2016-10-16 LAB — FERRITIN: Ferritin: 112 ng/mL (ref 24–336)

## 2016-10-16 MED ORDER — CYANOCOBALAMIN 1000 MCG/ML IJ SOLN
1000.0000 ug | Freq: Once | INTRAMUSCULAR | Status: AC
  Administered 2016-10-16: 1000 ug via INTRAMUSCULAR

## 2016-10-16 NOTE — Progress Notes (Signed)
Patient offers no complaints today. 
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
## 2016-10-18 ENCOUNTER — Encounter: Payer: Self-pay | Admitting: Gastroenterology

## 2016-11-07 ENCOUNTER — Telehealth: Payer: Self-pay | Admitting: Family Medicine

## 2016-11-07 NOTE — Telephone Encounter (Signed)
RX faxed

## 2016-11-07 NOTE — Telephone Encounter (Signed)
Patient's wife stopped by to drop off prescription for patient. Patient needing his prescriptions for Norvasc, Lipitor, and Lotensin refilled at ([facility].  Paper prescription put back in provider mailbox.  Fax Number: [phone]   Patient's ID number: [id number]

## 2016-11-13 ENCOUNTER — Inpatient Hospital Stay: Payer: Medicare HMO

## 2016-11-14 ENCOUNTER — Inpatient Hospital Stay: Payer: Medicare HMO | Attending: Hematology and Oncology

## 2016-11-14 DIAGNOSIS — E538 Deficiency of other specified B group vitamins: Secondary | ICD-10-CM

## 2016-11-14 DIAGNOSIS — E539 Vitamin B deficiency, unspecified: Secondary | ICD-10-CM | POA: Insufficient documentation

## 2016-11-14 DIAGNOSIS — Z79899 Other long term (current) drug therapy: Secondary | ICD-10-CM | POA: Diagnosis not present

## 2016-11-14 MED ORDER — CYANOCOBALAMIN 1000 MCG/ML IJ SOLN
1000.0000 ug | Freq: Once | INTRAMUSCULAR | Status: AC
  Administered 2016-11-14: 1000 ug via INTRAMUSCULAR
  Filled 2016-11-14: qty 1

## 2016-11-22 ENCOUNTER — Encounter: Payer: Self-pay | Admitting: Gastroenterology

## 2016-11-22 ENCOUNTER — Other Ambulatory Visit
Admission: RE | Admit: 2016-11-22 | Discharge: 2016-11-22 | Disposition: A | Discharge status: Home / Self Care | Payer: Medicare HMO | Source: Ambulatory Visit | Attending: Gastroenterology | Admitting: Gastroenterology

## 2016-11-22 ENCOUNTER — Ambulatory Visit (INDEPENDENT_AMBULATORY_CARE_PROVIDER_SITE_OTHER): Payer: Medicare HMO | Admitting: Gastroenterology

## 2016-11-22 ENCOUNTER — Other Ambulatory Visit: Payer: Self-pay

## 2016-11-22 VITALS — BP 156/65 | HR 101 | Temp 98.8°F | Ht 70.0 in | Wt 123.2 lb

## 2016-11-22 DIAGNOSIS — E538 Deficiency of other specified B group vitamins: Secondary | ICD-10-CM

## 2016-11-22 DIAGNOSIS — D696 Thrombocytopenia, unspecified: Secondary | ICD-10-CM

## 2016-11-22 LAB — VITAMIN B12: VITAMIN B 12: 680 pg/mL (ref 180–914)

## 2016-11-22 NOTE — Progress Notes (Signed)
Patrick Darby, Patrick Ayala [address]  [place]  [place], [place] [address]  Main: [phone]  Fax: [phone]    Gastroenterology Consultation  Referring Provider:     Guadalupe Maple, Patrick Ayala Primary Care Physician:  Patrick Maple, Patrick Ayala Primary Gastroenterologist:  Dr. Cephas Ayala Reason for Consultation:     Thrombocytopenia        HPI:   Patrick Ayala is a 76 y.o. y/o male referred for consultation & management  by Patrick Ayala, Patrick How, Patrick Ayala.  Patrick Ayala has chronic thrombocytopenia of unclear etiology, secondary polycythemia from smoking and COPD, B12 deficiency. He is closely followed by an oncologist, Dr. Mike Ayala. Based on oncology notes, workup for Patrick Ayala cytopenia has been negative so far. To me, he denies any GI symptoms. He has been receiving monthly B12 injections. I do not have his recent B12 levels. He denies fatigue, neurologic symptoms. He is on 2 L home O2 for COPD. He denies any constitutional symptoms. He continues to smoke half pack a day.  He had a surveillance colonoscopy on 07/29/2013, revealed 3 polyps which were removed,  2 of them had histology suggestive of tubular adenoma. He denies having an upper endoscopy.   Past Medical History:  Diagnosis Date  . Carboxyhemoglobinemia   . COPD (chronic obstructive pulmonary disease) ([place])   . Polycythemia, secondary 10/20/2014  . Thrombocytopenia ([place])     Past Surgical History:  Procedure Laterality Date  . APPENDECTOMY    . EYE SURGERY Right    cataract  . HERNIA REPAIR    . polycythemia      Prior to Admission medications   Medication Sig Start Date End Date Taking? Authorizing Provider  albuterol (PROVENTIL HFA;VENTOLIN HFA) 108 (90 Base) MCG/ACT inhaler Inhale 1-2 puffs into the lungs every 6 (six) hours as needed for wheezing or shortness of breath. 09/19/16  Yes Patrick Ayala, Patrick How, Patrick Ayala  amLODipine (NORVASC) 5 MG tablet Take 1 tablet (5 mg total) by mouth daily. 09/19/16  Yes Patrick Maple, Patrick Ayala    aspirin 81 MG tablet ASA 81 mg po once a day (start taking it from 08/02/2013)   Quantity: 0;  Refills: 0  Active   Yes Provider, Historical, Patrick Ayala  atorvastatin (LIPITOR) 20 MG tablet Take 1 tablet (20 mg total) by mouth daily. 09/19/16  Yes Patrick Ayala, Patrick How, Patrick Ayala  benazepril (LOTENSIN) 40 MG tablet Take 1 tablet (40 mg total) by mouth daily. 09/19/16  Yes Patrick Ayala, Patrick How, Patrick Ayala  budesonide-formoterol (SYMBICORT) 160-4.5 MCG/ACT inhaler Inhale 2 puffs into the lungs every morning. INHALE 2 TIMES DAILY 09/19/16  Yes Patrick Maple, Patrick Ayala    Family History  Problem Relation Age of Onset  . Cancer Mother   . Cancer Sister   . Cancer Brother      Social History  Substance Use Topics  . Smoking status: Current Every Day Smoker    Packs/day: 1.00    Types: Cigarettes  . Smokeless tobacco: Never Used     Comment: Smoking History 1(1)Packs per day  . Alcohol use 8.4 oz/week    14 Cans of beer per week     Comment: 2 cans of beer a night    Allergies as of 11/22/2016 - Review Complete 11/22/2016  Allergen Reaction Noted  . No known allergies  07/30/2014    Review of Systems:    All systems reviewed and negative except where noted in HPI.   Physical Exam:  BP (!) 156/65  Pulse (!) 101   Temp 98.8 F (37.1 C) (Oral)   Ht 5' 10" (1.778 m)   Wt 55.9 kg (123 lb 3.2 oz)   BMI 17.68 kg/m  No LMP for male patient. General:   Alert,  Well-developed, well-nourished, pleasant and cooperative in NAD Head:  Normocephalic and atraumatic. Eyes:  Sclera clear, no icterus.   Conjunctiva pink. Ears:  Normal auditory acuity. Nose:  No deformity, discharge, or lesions. Mouth:  No deformity or lesions,oropharynx pink & moist. Neck:  Supple; no masses or thyromegaly. Lungs:  Respirations even and unlabored.  Clear throughout to auscultation.   No wheezes, crackles, or rhonchi. No acute distress. Heart:  Regular rate and rhythm; no murmurs, clicks, rubs, or gallops. Abdomen:  Normal bowel sounds.  No  bruits.  Soft, non-tender and non-distended without masses, hepatosplenomegaly or hernias noted.  No guarding or rebound tenderness.    Msk:  Symmetrical without gross deformities. Good, equal movement & strength bilaterally. Pulses:  Normal pulses noted. Extremities:  No clubbing or edema.  No cyanosis. Neurologic:  Alert and oriented x3;  grossly normal neurologically. Skin:  Intact without significant lesions or rashes. No jaundice. Lymph Nodes:  No significant cervical adenopathy. Psych:  Alert and cooperative. Normal mood and affect.  Imaging Studies: Reviewed, normal liver and spleen    Assessment and Plan:   Patrick Ayala is a 76 y.o. y/o male has been referred for Chronic thrombocytopenia of unclear etiology. He does have B12 deficiency of unknown etiology, has been on monthly B12 injections. His thrombocytopenia is probably secondary to B 12 deficiency. He does not have liver disease, JAK2 mutation negative, normal iron studies, folate acid, not drug-induced. The other possibility is secondary to bone marrow suppression for which he may need bone marrow biopsy if thrombocytopenia gets worse. But I will defer this to his oncologist.  - I will check his B12 levels, intrinsic factor antibodies - I'll perform EGD to evaluate for any intestinal metaplasia, autoimmune gastritis - Recommend colonoscopy for colon cancer surveillance in 2020 given history of tubular adenomas   Follow up in 2 months    R , Patrick Ayala  

## 2016-11-22 NOTE — Patient Instructions (Addendum)
1. Check B12 levels and intrinsic factor antibodies 2. Schedule EGD with biopsies 3. Colonoscopy for colon cancer/polyp surveillance in 2020   Please call our office to speak with my nurse Driscilla Grammes at [phone] during business hours from 8am to 4pm if you have any questions/concerns. During after hours, you will be redirected to on call GI physician. For any emergency please call 911 or go the nearest emergency room.    Cephas Darby, MD [address]  [place]  [place], [place] [address]  Main: [phone]  Fax: [phone]

## 2016-11-23 ENCOUNTER — Other Ambulatory Visit: Payer: Self-pay

## 2016-11-23 DIAGNOSIS — R634 Abnormal weight loss: Secondary | ICD-10-CM

## 2016-11-23 DIAGNOSIS — D696 Thrombocytopenia, unspecified: Secondary | ICD-10-CM

## 2016-11-23 LAB — INTRINSIC FACTOR ANTIBODIES: Intrinsic Factor: 1 AU/mL (ref 0.0–1.1)

## 2016-12-12 ENCOUNTER — Inpatient Hospital Stay: Payer: Medicare HMO | Attending: Hematology and Oncology

## 2016-12-13 ENCOUNTER — Telehealth: Payer: Self-pay | Admitting: Gastroenterology

## 2016-12-13 NOTE — Telephone Encounter (Signed)
Patient called to cancel his procedure for tomorrow. Please call him to reschedule. No transportation and no money at this time to pay for it. Please call to reschedule. I called [facility] to let them know

## 2016-12-14 ENCOUNTER — Encounter: Admission: RE | Payer: Self-pay | Source: Ambulatory Visit

## 2016-12-14 ENCOUNTER — Ambulatory Visit: Admission: RE | Admit: 2016-12-14 | Payer: Medicare HMO | Source: Ambulatory Visit

## 2016-12-14 SURGERY — ESOPHAGOGASTRODUODENOSCOPY (EGD) WITH PROPOFOL
Anesthesia: General

## 2017-01-03 ENCOUNTER — Ambulatory Visit: Payer: Medicare HMO | Attending: Hematology and Oncology

## 2017-01-08 ENCOUNTER — Inpatient Hospital Stay: Payer: Medicare HMO | Attending: Hematology and Oncology

## 2017-01-08 DIAGNOSIS — Z79899 Other long term (current) drug therapy: Secondary | ICD-10-CM | POA: Insufficient documentation

## 2017-01-08 DIAGNOSIS — E538 Deficiency of other specified B group vitamins: Secondary | ICD-10-CM | POA: Insufficient documentation

## 2017-01-09 ENCOUNTER — Telehealth: Payer: Self-pay | Admitting: *Deleted

## 2017-01-09 DIAGNOSIS — R9389 Abnormal findings on diagnostic imaging of other specified body structures: Secondary | ICD-10-CM

## 2017-01-09 NOTE — Telephone Encounter (Signed)
Notified patient that  lung cancer screening low dose CT scan is due currently or will be in near future. Confirmed that patient is within the age range of 55-77, and asymptomatic, (no signs or symptoms of lung cancer). Patient denies illness that would prevent curative treatment for lung cancer if found. Verified smoking history, (current, 31 pack year). The shared decision making visit was done 10/24/13. Patient is agreeable for CT scan being scheduled.

## 2017-01-12 ENCOUNTER — Other Ambulatory Visit: Payer: Self-pay | Admitting: Family Medicine

## 2017-01-17 ENCOUNTER — Inpatient Hospital Stay: Payer: Medicare HMO

## 2017-01-17 ENCOUNTER — Ambulatory Visit
Admission: RE | Admit: 2017-01-17 | Discharge: 2017-01-17 | Disposition: A | Discharge status: Home / Self Care | Payer: Medicare HMO | Source: Ambulatory Visit | Attending: Oncology | Admitting: Oncology

## 2017-01-17 DIAGNOSIS — R9389 Abnormal findings on diagnostic imaging of other specified body structures: Secondary | ICD-10-CM | POA: Insufficient documentation

## 2017-01-17 DIAGNOSIS — J439 Emphysema, unspecified: Secondary | ICD-10-CM | POA: Insufficient documentation

## 2017-01-17 DIAGNOSIS — Z79899 Other long term (current) drug therapy: Secondary | ICD-10-CM | POA: Diagnosis not present

## 2017-01-17 DIAGNOSIS — I700 Atherosclerosis of aorta: Secondary | ICD-10-CM | POA: Insufficient documentation

## 2017-01-17 DIAGNOSIS — E538 Deficiency of other specified B group vitamins: Secondary | ICD-10-CM | POA: Diagnosis not present

## 2017-01-17 DIAGNOSIS — I2510 Atherosclerotic heart disease of native coronary artery without angina pectoris: Secondary | ICD-10-CM | POA: Diagnosis not present

## 2017-01-17 DIAGNOSIS — R918 Other nonspecific abnormal finding of lung field: Secondary | ICD-10-CM | POA: Diagnosis not present

## 2017-01-17 MED ORDER — CYANOCOBALAMIN 1000 MCG/ML IJ SOLN
1000.0000 ug | Freq: Once | INTRAMUSCULAR | Status: AC
Start: 2017-01-17 — End: 2017-01-17
  Administered 2017-01-17: 1000 ug via INTRAMUSCULAR

## 2017-01-22 ENCOUNTER — Encounter: Payer: Self-pay | Admitting: *Deleted

## 2017-01-31 ENCOUNTER — Telehealth: Payer: Self-pay | Admitting: Family Medicine

## 2017-01-31 NOTE — Telephone Encounter (Signed)
Ok to do

## 2017-01-31 NOTE — Telephone Encounter (Signed)
Copied from [place] [phone]. Topic: Quick Communication - See Telephone Encounter >> Jan 31, 2017  4:07 PM Patrick Ayala, Helene Kelp D wrote: CRM for notification. See Telephone encounter for: 01/31/17. Patient called and would like for Dr. Jeananne Rama to write and RX for a portable oxygen tank.

## 2017-01-31 NOTE — Telephone Encounter (Signed)
Patient is requesting portable O2 tank. Does have appointment scheduled 02/16/2017.

## 2017-02-01 ENCOUNTER — Telehealth: Payer: Self-pay | Admitting: Family Medicine

## 2017-02-01 NOTE — Telephone Encounter (Signed)
O2 Form filled out and placed on provider's box for signature.

## 2017-02-01 NOTE — Telephone Encounter (Signed)
Copied from [place] #[id number]. Topic: Quick Communication - See Telephone Encounter >> Feb 01, 2017 11:52 AM Boyd Kerbs wrote: CRM for notification. See Telephone encounter for:  02/01/17. Order for oxygen went to [facility] did not take his insurance. Judeth Porch [phone]

## 2017-02-01 NOTE — Telephone Encounter (Signed)
Form was faxed to [facility]!

## 2017-02-01 NOTE — Telephone Encounter (Signed)
Referral for O2 resubmitted to [facility] via online portal.

## 2017-02-16 ENCOUNTER — Inpatient Hospital Stay: Payer: Medicare HMO

## 2017-02-16 ENCOUNTER — Other Ambulatory Visit: Payer: Self-pay | Admitting: Family Medicine

## 2017-02-16 ENCOUNTER — Other Ambulatory Visit: Payer: Self-pay | Admitting: Hematology and Oncology

## 2017-02-16 ENCOUNTER — Other Ambulatory Visit: Payer: Self-pay | Admitting: *Deleted

## 2017-02-16 ENCOUNTER — Inpatient Hospital Stay: Payer: Medicare HMO | Admitting: Hematology and Oncology

## 2017-02-16 ENCOUNTER — Telehealth: Payer: Self-pay | Admitting: Family Medicine

## 2017-02-16 DIAGNOSIS — I10 Essential (primary) hypertension: Secondary | ICD-10-CM

## 2017-02-16 DIAGNOSIS — E538 Deficiency of other specified B group vitamins: Secondary | ICD-10-CM

## 2017-02-16 DIAGNOSIS — E7800 Pure hypercholesterolemia, unspecified: Secondary | ICD-10-CM

## 2017-02-16 MED ORDER — BENAZEPRIL HCL 40 MG PO TABS: 40.0000 mg | ORAL_TABLET | Freq: Every day | ORAL | 0 refills | Status: DC

## 2017-02-16 MED ORDER — ATORVASTATIN CALCIUM 20 MG PO TABS: 20.0000 mg | ORAL_TABLET | Freq: Every day | ORAL | 4 refills | Status: DC

## 2017-02-16 MED ORDER — AMLODIPINE BESYLATE 5 MG PO TABS: 5.0000 mg | ORAL_TABLET | Freq: Every day | ORAL | 0 refills | Status: DC

## 2017-02-16 NOTE — Progress Notes (Deleted)
[facility] day:  02/16/17  Chief Complaint: Patrick Ayala is a 76 y.o. male with secondary polycythemia, thrombocytopenia and B12 deficiency who is seen for 4 month assessment.  HPI: The patient was last seen in the medical oncology clinic on 10/16/2016.  At that time, he denied any new complaints.  Exam revealed right sided wheezing.  Hematocrit was 42.1.  Platelet count was 99,000.  B12 was 680.  Low dose chest CT on 07/03/2016 revealed new bilateral upper lobe pulmonary nodules.  Short-term follow-up in 6 months was recommended.    Chest CT on 01/17/2017 revealed multiple small pulmonary nodules throughout the lungs bilaterally. The largest nodule on the prior study had completely resolved, indicative of a benign etiology. The largest nodule had a volume derived mean diameter 4.1 mm aspect of the right upper lobe and was stable compared to prior examinations.  There were no larger more suspicious appearing pulmonary nodules or masses are otherwise noted.  There was mild diffuse bronchial wall thickening with severe centrilobular and paraseptal emphysema.  Chest CT in 12 month was recommended.  He was seen by Dr. Marius Ditch on 11/22/2016.  Last colonoscopy was discovered to be in 2015.  He was scheduled for EGD.  Intrinsic factor antibodies were negative.  He has continued monthly B12 (last 01/17/2017).  Symptomatically,    Past Medical History:  Diagnosis Date  . Carboxyhemoglobinemia   . COPD (chronic obstructive pulmonary disease) ([place])   . Polycythemia, secondary 10/20/2014  . Thrombocytopenia ([place])     Past Surgical History:  Procedure Laterality Date  . APPENDECTOMY    . EYE SURGERY Right    cataract  . HERNIA REPAIR    . polycythemia      Family History  Problem Relation Age of Onset  . Cancer Mother   . Cancer Sister   . Cancer Brother     Social History:  reports that he has been smoking cigarettes.  He has been smoking  about 1.00 pack per day. he has never used smokeless tobacco. He reports that he drinks about 8.4 oz of alcohol per week. He reports that he does not use drugs.  He works clearing off land with a bobcat.  He smokes 1/2 pack per day.  He drinks 2 beers per night.  He lives in [place].  He is alone today.  Allergies:  Allergies  Allergen Reactions  . No Known Allergies     Current Medications: Current Outpatient Medications  Medication Sig Dispense Refill  . albuterol (PROVENTIL HFA;VENTOLIN HFA) 108 (90 Base) MCG/ACT inhaler Inhale 1-2 puffs into the lungs every 6 (six) hours as needed for wheezing or shortness of breath. 1 Inhaler 2  . amLODipine (NORVASC) 5 MG tablet Take 1 tablet (5 mg total) by mouth daily. 90 tablet 4  . aspirin 81 MG tablet ASA 81 mg po once a day (start taking it from 08/02/2013)   Quantity: 0;  Refills: 0  Active    . atorvastatin (LIPITOR) 20 MG tablet Take 1 tablet (20 mg total) by mouth daily. 90 tablet 4  . benazepril (LOTENSIN) 40 MG tablet Take 1 tablet (40 mg total) by mouth daily. 90 tablet 4  . budesonide-formoterol (SYMBICORT) 160-4.5 MCG/ACT inhaler Inhale 2 puffs into the lungs every morning. INHALE 2 TIMES DAILY 3 Inhaler 4  . SYMBICORT 160-4.5 MCG/ACT inhaler INHALE 2 PUFFS INTO THE LUNGS EVERY MORNING. INHALE 2 TIMES DAILY 30.6 Inhaler 3   No  current facility-administered medications for this visit.     Review of Systems:  GENERAL:  Feels "good".  No fevers or sweats.  Weight down 4 pounds. PERFORMANCE STATUS (ECOG):  1 HEENT:  Needs dental extractions.  No visual changes, runny nose, sore throat, mouth sores or tenderness. Lungs: Shortness of breath on exertion.  Chronic cough for years.  Some wheezing.  No hemoptysis. Cardiac:  No chest pain, palpitations, orthopnea, or PND. GI:  No nausea, vomiting, diarrhea, constipation, melena or hematochezia. GU:  Difficulty starting stream.  No urgency, frequency, dysuria, or  hematuria. Musculoskeletal:  No back pain.  No joint pain.  No muscle tenderness. Extremities:  No pain or swelling. Skin:  Easy bruising. No rashes or skin changes. Neuro:  No headache, numbness or weakness, balance or coordination issues. Endocrine:  No diabetes, thyroid issues, hot flashes or night sweats. Psych:  No mood changes, depression or anxiety. No difficulty sleeping. Pain:  No focal pain. Review of systems:  All other systems reviewed and found to be negative.  Physical Exam: There were no vitals taken for this visit. GENERAL:  Thin gentleman sitting comfortably in the exam room in no acute distress. MENTAL STATUS:  Alert and oriented to person, place and time. HEAD: Wearing a cap.  Gray hair and [employer].  Normocephalic, atraumatic, face symmetric, no Cushingoid features. EYES:  Brown eyes.  Ruddy sclera.  Pupils equal round and reactive to light and accomodation.  No conjunctivitis or scleral icterus. ENT:  Oropharynx clear without lesion. Poor dentition, missing teeth. Tongue normal. Mucous membranes moist.  RESPIRATORY:  On 2L O2. Right sided wheezes.  Otherwise clear to auscultation without rales or rhonchi. CARDIOVASCULAR:  Regular rate and rhythm without murmur, rub or gallop. ABDOMEN:  Ventral hernia.  Soft, non-tender, with active bowel sounds, and no hepatosplenomegaly.  No masses. SKIN:  Nicotine stained nails.  Bruising on left forearm. No rashes, ulcers or lesions. EXTREMITIES: No edema, no skin discoloration or tenderness.  No palpable cords. LYMPH NODES: No palpable cervical, supraclavicular, axillary or inguinal adenopathy  NEUROLOGICAL: Unremarkable. PSYCH:  Appropriate.   No visits with results within 3 Day(s) from this visit.  Latest known visit with results is:  Hospital Outpatient Visit on 11/22/2016  Component Date Value Ref Range Status  . Intrinsic Factor 11/22/2016 1.0  0.0 - 1.1 AU/mL Final   Comment: (NOTE) Performed At: [facility] [place], [place] [id number] Lindon Romp MD MI:[id number]   . Vitamin B-12 11/22/2016 680  180 - 914 pg/mL Final   Comment: (NOTE) This assay is not validated for testing neonatal or myeloproliferative syndrome specimens for Vitamin B12 levels. Performed at [facility], [place] [address] [place], [place] [address]     Assessment:  Patrick Ayala is a 76 y.o. male with secondary polycythemia and thrombocytopenia of unclear etiology.  He smokes and has a history of an elevated carboxyhemoglobin as high as 23% attributed to smoking in a small unventilated room for many hours and a truck exhaust leak (fixed about a year ago).  He has carbon monoxide detectors. He currently smokes 1/2 pack per day, for the past 6 weeks, reduced from 1 pack per day previously.  He is contemplating and preparing to quit, has nicotine patches.  Platelet count has ranged from 61,000 to 139,000 without trend since 09/2012.  Prior work-up noted the following normal labs:  erythropoietin level, iron studies, JAK2, HIV, ANA, and hepatitis C. Ultrasound revealed no splenomegaly. 89,000  today (06/16/2016).  Chest CT on 06/23/2015 revealed resolution of the left upper lobe pulmonary nodule.  Low dose chest CT on 07/03/2016 revealed new bilateral upper lobe pulmonary nodules, including a dominant ill-defined left upper lobe pulmonary nodule.  There was coronary artery atherosclerosis and aortic atherosclerosis.  There was aortic valvular calcifications.   He has had multiple phlebotomies beginning in 09/2012 (last 11/19/2015). He undergoes small phlebotomies (250-300 cc) with supplemental IVF to maintain a hematocrit less than 52.  He has B12 deficiency.  B12 was 260 (low normal) on 12/07/2014.  MMA was elevated c/w B12 deficiency.  He was started on B12 weekly x 6 (02/01/2015 - 03/01/2015) prior to a monthly schedule (last 01/17/2017).  Folate was 16.1 on 11/19/2015 and 18.9 on 06/16/2016.   Colonoscopy on 07/29/2013 revealed 3 polyps.  Two had histology suggestive of tubular adenoma.   Symptomatically, he denies any new complaints.  Exam reveals right sided wheezing.  Hematocrit is 42.1.  Platelet count is 99,000.  Plan: 1.  Labs today:  CBC with diff, CMP, ferritin, folate, anti-parietal antibodies. 2.  Review interval low dose chest CT.  Anticipate next CT on 01/17/2018.  2.  Discuss interval low dose chest CT.  Discuss new bilateral upper lobe pulmonary nodules with plan for follow-up in 6 months. 3.  Discuss coronary artery atherosclerosis, aortic atherosclerosis, aortic valvular calcifications, and probable pulmonary arterial hypertension.  Discuss need to f/u with PCP regarding echocardiogram. 4.  Low dose chest CT scheduled for 01/03/2017. 5.  Discuss continued smoking cessation. 5.  No phlebotomy today 6.  B12 today. 7.  RTC monthly x 3 for B12 injection. 8.  Gastroeneterology referral. 9.  RTC in 4 months for MD assessment, labs (CBC with diff, CMP, ferritin, folate), B12, and +/- phlebotomy.   Lequita Asal, MD 02/16/2017, 6:01 AM   I saw and evaluated the patient, participating in the key portions of the service and reviewing pertinent diagnostic studies and records.  I reviewed the nurse practitioner's note and agree with the findings and the plan.  The assessment and plan were discussed with the patient.  Additional diagnostic studies of *** are needed to clarify *** and would change the clinical management.  A few ***multiple questions were asked by the patient and answered.   Nolon Stalls, MD 02/16/2017,6:01 AM

## 2017-02-16 NOTE — Telephone Encounter (Signed)
Spoke with patient.  Was sent amlodipine but not benazepril. Patient has not received his refill from mail for amlodipine now either.  Patient is completely out of medication and wants to know if a refill of his BP medication could be called [facility] to [facility] [place]. Patient stated he needs it.

## 2017-02-16 NOTE — Telephone Encounter (Signed)
Called pharmacy to see if they received the patient's prescriptions. They state that the amlodipine was picked up recently and that the benazepril still has refills on it with them but they did not pick it up.  Tried calling patient to make sure he got his medications but he did not answer so I left a VM asking for him to please give [place] a call back.

## 2017-02-16 NOTE — Telephone Encounter (Signed)
Copied from [place] [phone]. Topic: Quick Communication - See Telephone Encounter >> Feb 16, 2017 10:35 AM Hewitt Shorts wrote: CRM for notification. See Telephone encounter for:  Pt is needing to get a refill on lipator   Best number  02/16/17.

## 2017-02-16 NOTE — Telephone Encounter (Signed)
Patient has a year supply on both Benazepril and Amlodipine.  In June, 90 day supply with 4 refills.  It was sent to [facility].

## 2017-02-16 NOTE — Telephone Encounter (Signed)
Sent in [facility] supply [facility], can you look into if we need to re-send mail order rx's?

## 2017-02-16 NOTE — Telephone Encounter (Signed)
Copied from [place] #[id number]. Topic: Quick Communication - See Telephone Encounter >> Feb 16, 2017  1:55 PM Arletha Grippe wrote: CRM for notification. See Telephone encounter for:   02/16/17. Pt is calling because she needs his blood pressure pill,  [facility].  He gets a 3 month supply Cb number is [phone]. Pt would like to have return call, he needs refill

## 2017-02-20 ENCOUNTER — Inpatient Hospital Stay: Payer: Medicare HMO | Attending: Hematology and Oncology

## 2017-02-20 ENCOUNTER — Encounter: Payer: Self-pay | Admitting: Hematology and Oncology

## 2017-02-20 ENCOUNTER — Inpatient Hospital Stay: Payer: Medicare HMO

## 2017-02-20 ENCOUNTER — Other Ambulatory Visit: Payer: Self-pay | Admitting: *Deleted

## 2017-02-20 ENCOUNTER — Inpatient Hospital Stay (HOSPITAL_BASED_OUTPATIENT_CLINIC_OR_DEPARTMENT_OTHER): Payer: Medicare HMO | Admitting: Hematology and Oncology

## 2017-02-20 VITALS — BP 129/69 | HR 90 | Temp 97.4°F | Wt 121.4 lb

## 2017-02-20 DIAGNOSIS — E538 Deficiency of other specified B group vitamins: Secondary | ICD-10-CM

## 2017-02-20 DIAGNOSIS — D751 Secondary polycythemia: Secondary | ICD-10-CM

## 2017-02-20 DIAGNOSIS — Z79899 Other long term (current) drug therapy: Secondary | ICD-10-CM | POA: Insufficient documentation

## 2017-02-20 DIAGNOSIS — D696 Thrombocytopenia, unspecified: Secondary | ICD-10-CM

## 2017-02-20 DIAGNOSIS — Z809 Family history of malignant neoplasm, unspecified: Secondary | ICD-10-CM

## 2017-02-20 DIAGNOSIS — Z7982 Long term (current) use of aspirin: Secondary | ICD-10-CM | POA: Diagnosis not present

## 2017-02-20 DIAGNOSIS — F17210 Nicotine dependence, cigarettes, uncomplicated: Secondary | ICD-10-CM | POA: Insufficient documentation

## 2017-02-20 DIAGNOSIS — R918 Other nonspecific abnormal finding of lung field: Secondary | ICD-10-CM

## 2017-02-20 DIAGNOSIS — J449 Chronic obstructive pulmonary disease, unspecified: Secondary | ICD-10-CM | POA: Diagnosis not present

## 2017-02-20 LAB — COMPREHENSIVE METABOLIC PANEL
ALT: 18 U/L (ref 17–63)
AST: 26 U/L (ref 15–41)
Albumin: 4.4 g/dL (ref 3.5–5.0)
Alkaline Phosphatase: 57 U/L (ref 38–126)
Anion gap: 9 (ref 5–15)
BUN: 13 mg/dL (ref 6–20)
CO2: 38 mmol/L — ABNORMAL HIGH (ref 22–32)
Calcium: 9.4 mg/dL (ref 8.9–10.3)
Chloride: 86 mmol/L — ABNORMAL LOW (ref 101–111)
Creatinine, Ser: 0.56 mg/dL — ABNORMAL LOW (ref 0.61–1.24)
GFR calc Af Amer: >60 mL/min (ref >60)
GFR calc non Af Amer: >60 mL/min (ref >60)
Glucose, Bld: 88 mg/dL (ref 65–99)
Potassium: 4.5 mmol/L (ref 3.5–5.1)
Sodium: 133 mmol/L — ABNORMAL LOW (ref 135–145)
Total Bilirubin: 1.3 mg/dL — ABNORMAL HIGH (ref 0.3–1.2)
Total Protein: 7.3 g/dL (ref 6.5–8.1)

## 2017-02-20 LAB — CBC WITH DIFFERENTIAL/PLATELET
Basophils Absolute: 0.1 10*3/uL (ref 0–0.1)
Basophils Relative: 1 %
Eosinophils Absolute: 0.1 10*3/uL (ref 0–0.7)
Eosinophils Relative: 1 %
HCT: 44.9 % (ref 40.0–52.0)
Hemoglobin: 15 g/dL (ref 13.0–18.0)
Lymphocytes Relative: 19 %
Lymphs Abs: 1.8 10*3/uL (ref 1.0–3.6)
MCH: 33.5 pg (ref 26.0–34.0)
MCHC: 33.5 g/dL (ref 32.0–36.0)
MCV: 100 fL (ref 80.0–100.0)
Monocytes Absolute: 0.6 10*3/uL (ref 0.2–1.0)
Monocytes Relative: 6 %
Neutro Abs: 7 10*3/uL — ABNORMAL HIGH (ref 1.4–6.5)
Neutrophils Relative %: 73 %
Platelets: 86 10*3/uL — ABNORMAL LOW (ref 150–440)
RBC: 4.49 MIL/uL (ref 4.40–5.90)
RDW: 13.7 % (ref 11.5–14.5)
WBC: 9.5 10*3/uL (ref 3.8–10.6)

## 2017-02-20 LAB — FERRITIN: Ferritin: 153 ng/mL (ref 24–336)

## 2017-02-20 LAB — FOLATE: Folate: 33 ng/mL (ref >5.9)

## 2017-02-20 MED ORDER — CYANOCOBALAMIN 1000 MCG/ML IJ SOLN
1000.0000 ug | Freq: Once | INTRAMUSCULAR | Status: AC
  Administered 2017-02-20: 1000 ug via INTRAMUSCULAR

## 2017-02-20 NOTE — Progress Notes (Signed)
Pt in for follow up, denies any concerns or difficulties.  Pt has oxygen via  at 2l/m

## 2017-02-20 NOTE — Progress Notes (Signed)
Patrick Ayala day:  02/20/17  Chief Complaint: Patrick Ayala is a 76 y.o. male with secondary polycythemia, thrombocytopenia and B12 deficiency who is seen for 4 month assessment.  HPI: The patient was last seen in the medical oncology [facility] on 10/16/2016.  At that time, he denied any new complaints.  Exam revealed right sided wheezing.  Hematocrit was 42.1.  Platelet count was 99,000.  B12 was 680.  Low dose chest CT on 07/03/2016 revealed new bilateral upper lobe pulmonary nodules.  Short-term follow-up in 6 months was recommended.    Chest CT on 01/17/2017 revealed multiple small pulmonary nodules throughout the lungs bilaterally. The largest nodule on the prior study had completely resolved, indicative of a benign etiology. The largest nodule had a volume derived mean diameter 4.1 mm aspect of the right upper lobe and was stable compared to prior examinations.  There were no larger more suspicious appearing pulmonary nodules or masses are otherwise noted.  There was mild diffuse bronchial wall thickening with severe centrilobular and paraseptal emphysema.  Chest CT in 12 month was recommended.  He was seen by Dr. Marius Ditch on 11/22/2016.  Last colonoscopy was discovered to be in 2015.  He was scheduled for EGD.  Intrinsic factor antibodies were negative.  He has continued monthly B12 (last 01/17/2017).  Symptomatically, patient has been "doing really good". Patient present on supplemental oxygen at 2L/[place], which he uses around the clock. Patient denies increased shortness of breath. Patient denies any increased bruising or bleeding; no hematochezia, melena, or gross hematuria.  Patient denies B symptoms. He has experienced any interval infections.  Patient continues to smoke "about 4 cigarettes a day".    Past Medical History:  Diagnosis Date  . Carboxyhemoglobinemia   . COPD (chronic obstructive pulmonary disease) ([place])   . Polycythemia, secondary  10/20/2014  . Thrombocytopenia ([place])     Past Surgical History:  Procedure Laterality Date  . APPENDECTOMY    . EYE SURGERY Right    cataract  . HERNIA REPAIR    . polycythemia      Family History  Problem Relation Age of Onset  . Cancer Mother   . Cancer Sister   . Cancer Brother     Social History:  reports that he has been smoking cigarettes.  He has been smoking about 1.00 pack per day. he has never used smokeless tobacco. He reports that he drinks about 8.4 oz of alcohol per week. He reports that he does not use drugs.  He works clearing off land with a bobcat.  He smokes 1/2 pack per day.  He drinks 2 beers per night.  He lives in [place].  He is alone today.  Allergies:  Allergies  Allergen Reactions  . No Known Allergies     Current Medications: Current Outpatient Medications  Medication Sig Dispense Refill  . albuterol (PROVENTIL HFA;VENTOLIN HFA) 108 (90 Base) MCG/ACT inhaler Inhale 1-2 puffs into the lungs every 6 (six) hours as needed for wheezing or shortness of breath. 1 Inhaler 2  . amLODipine (NORVASC) 5 MG tablet Take 1 tablet (5 mg total) daily by mouth. 30 tablet 0  . aspirin 81 MG tablet ASA 81 mg po once a day (start taking it from 08/02/2013)   Quantity: 0;  Refills: 0  Active    . atorvastatin (LIPITOR) 20 MG tablet Take 1 tablet (20 mg total) daily by mouth. 90 tablet 4  . benazepril (LOTENSIN) 40 MG  tablet Take 1 tablet (40 mg total) daily by mouth. 30 tablet 0  . budesonide-formoterol (SYMBICORT) 160-4.5 MCG/ACT inhaler Inhale 2 puffs into the lungs every morning. INHALE 2 TIMES DAILY 3 Inhaler 4  . SYMBICORT 160-4.5 MCG/ACT inhaler INHALE 2 PUFFS INTO THE LUNGS EVERY MORNING. INHALE 2 TIMES DAILY 30.6 Inhaler 3   No current facility-administered medications for this visit.     Review of Systems:  GENERAL:  Feels "really good".  No fevers or sweats.  Weight down 2 pounds. PERFORMANCE STATUS (ECOG):  1 HEENT:  Needs dental extractions.  No  visual changes, runny nose, sore throat, mouth sores or tenderness. Lungs: Shortness of breath on exertion.  Chronic cough for years.  Some wheezing.  No hemoptysis. Cardiac:  No chest pain, palpitations, orthopnea, or PND. GI:  No nausea, vomiting, diarrhea, constipation, melena or hematochezia. GU:  Difficulty starting stream.  No urgency, frequency, dysuria, or hematuria. Musculoskeletal:  No back pain.  No joint pain.  No muscle tenderness. Extremities:  No pain or swelling. Skin:  Easy bruising. No rashes or skin changes. Neuro:  No headache, numbness or weakness, balance or coordination issues. Endocrine:  No diabetes, thyroid issues, hot flashes or night sweats. Psych:  No mood changes, depression or anxiety. No difficulty sleeping. Pain:  No focal pain. Review of systems:  All other systems reviewed and found to be negative.  Physical Exam: Blood pressure 129/69, pulse 90, temperature (!) 97.4 F (36.3 C), temperature source Tympanic, weight 121 lb 6 oz (55.1 kg). GENERAL:  Thin gentleman sitting comfortably in the exam room in no acute distress. MENTAL STATUS:  Alert and oriented to person, place and time. HEAD: Wearing a cap.  Gray hair and [employer].  Normocephalic, atraumatic, face symmetric, no Cushingoid features. EYES:  Brown eyes.  Ruddy sclera.  Pupils equal round and reactive to light and accomodation.  No conjunctivitis or scleral icterus. ENT:  Oropharynx clear without lesion. Poor dentition, missing teeth. Tongue normal. Mucous membranes moist.  RESPIRATORY:  On 2L O2. Right sided wheezes.  Otherwise clear to auscultation without rales or rhonchi. CARDIOVASCULAR:  Regular rate and rhythm without murmur, rub or gallop. ABDOMEN:  Ventral hernia.  Firm abdomen.  Soft, non-tender, with active bowel sounds, and no appreciable hepatosplenomegaly.  No masses. SKIN:  Nicotine stained nails.  Bruising on left forearm. No rashes, ulcers or lesions. EXTREMITIES: No edema, no skin  discoloration or tenderness.  No palpable cords. LYMPH NODES: No palpable cervical, supraclavicular, axillary or inguinal adenopathy  NEUROLOGICAL: Unremarkable. PSYCH:  Appropriate.   Appointment on 02/20/2017  Component Date Value Ref Range Status  . Sodium 02/20/2017 133* 135 - 145 mmol/L Final  . Potassium 02/20/2017 4.5  3.5 - 5.1 mmol/L Final  . Chloride 02/20/2017 86* 101 - 111 mmol/L Final  . CO2 02/20/2017 38* 22 - 32 mmol/L Final  . Glucose, Bld 02/20/2017 88  65 - 99 mg/dL Final  . BUN 02/20/2017 13  6 - 20 mg/dL Final  . Creatinine, Ser 02/20/2017 0.56* 0.61 - 1.24 mg/dL Final  . Calcium 02/20/2017 9.4  8.9 - 10.3 mg/dL Final  . Total Protein 02/20/2017 7.3  6.5 - 8.1 g/dL Final  . Albumin 02/20/2017 4.4  3.5 - 5.0 g/dL Final  . AST 02/20/2017 26  15 - 41 U/L Final  . ALT 02/20/2017 18  17 - 63 U/L Final  . Alkaline Phosphatase 02/20/2017 57  38 - 126 U/L Final  . Total Bilirubin 02/20/2017 1.3* 0.3 - 1.2  mg/dL Final  . GFR calc non Af Amer 02/20/2017 >60  >60 mL/min Final  . GFR calc Af Amer 02/20/2017 >60  >60 mL/min Final   Comment: (NOTE) The eGFR has been calculated using the CKD EPI equation. This calculation has not been validated in all clinical situations. eGFR's persistently <60 mL/min signify possible Chronic Kidney Disease.   . Anion gap 02/20/2017 9  5 - 15 Final  . WBC 02/20/2017 9.5  3.8 - 10.6 K/uL Final  . RBC 02/20/2017 4.49  4.40 - 5.90 MIL/uL Final  . Hemoglobin 02/20/2017 15.0  13.0 - 18.0 g/dL Final  . HCT 02/20/2017 44.9  40.0 - 52.0 % Final  . MCV 02/20/2017 100.0  80.0 - 100.0 fL Final  . MCH 02/20/2017 33.5  26.0 - 34.0 pg Final  . MCHC 02/20/2017 33.5  32.0 - 36.0 g/dL Final  . RDW 02/20/2017 13.7  11.5 - 14.5 % Final  . Platelets 02/20/2017 86* 150 - 440 K/uL Final  . Neutrophils Relative % 02/20/2017 73  % Final  . Neutro Abs 02/20/2017 7.0* 1.4 - 6.5 K/uL Final  . Lymphocytes Relative 02/20/2017 19  % Final  . Lymphs Abs 02/20/2017  1.8  1.0 - 3.6 K/uL Final  . Monocytes Relative 02/20/2017 6  % Final  . Monocytes Absolute 02/20/2017 0.6  0.2 - 1.0 K/uL Final  . Eosinophils Relative 02/20/2017 1  % Final  . Eosinophils Absolute 02/20/2017 0.1  0 - 0.7 K/uL Final  . Basophils Relative 02/20/2017 1  % Final  . Basophils Absolute 02/20/2017 0.1  0 - 0.1 K/uL Final    Assessment:  MICHALL NOFFKE is a 76 y.o. male with secondary polycythemia and thrombocytopenia of unclear etiology.  He smokes and has a history of an elevated carboxyhemoglobin as high as 23% attributed to smoking in a small unventilated room for many hours and a truck exhaust leak (fixed about a year ago).  He has carbon monoxide detectors. He currently smokes 1/2 pack per day, for the past 6 weeks, reduced from 1 pack per day previously.  He is contemplating and preparing to quit, has nicotine patches.  Platelet count has ranged from 61,000 to 139,000 without trend since 09/2012.  Prior work-up noted the following normal labs:  erythropoietin level, iron studies, JAK2, HIV, ANA, and hepatitis C. Ultrasound revealed no splenomegaly. 89,000 today (06/16/2016).  Chest CT on 06/23/2015 revealed resolution of the left upper lobe pulmonary nodule.  Low dose chest CT on 07/03/2016 revealed new bilateral upper lobe pulmonary nodules, including a dominant ill-defined left upper lobe pulmonary nodule.   Chest CT on 01/17/2017 revealed multiple small pulmonary nodules throughout the lungs bilaterally. The largest nodule on the prior study had completely resolved, indicative of a benign etiology. The largest nodule had a volume derived mean diameter 4.1 mm aspect of the right upper lobe and was stable compared to prior examinations.  There were no larger more suspicious appearing pulmonary nodules or masses are otherwise noted.  There was mild diffuse bronchial wall thickening with severe centrilobular and paraseptal emphysema.  Chest CT in 12 month was recommended.  He has  had multiple phlebotomies beginning in 09/2012 (last 11/19/2015). He undergoes small phlebotomies (250-300 cc) with supplemental IVF to maintain a hematocrit less than 52.  He has B12 deficiency.  B12 was 260 (low normal) on 12/07/2014.  MMA was elevated c/w B12 deficiency.  He was started on B12 weekly x 6 (02/01/2015 - 03/01/2015) prior to a monthly  schedule (last 01/17/2017).  Intrinsic factor antibodies were negative.  Folate was 16.1 on 11/19/2015 and 18.9 on 06/16/2016.    Colonoscopy on 07/29/2013 revealed 3 polyps.  Two had histology suggestive of tubular adenoma.   Symptomatically, he denies any new complaints.  Exam reveals right sided wheezing.  Hematocrit is 44.9.  Platelet count is 86,000.  Plan: 1.  Labs today:  CBC with diff, CMP, ferritin, folate, anti-parietal antibodies. 2.  Review interval low dose chest CT.  Anticipate next CT on 01/17/2018. 3.  Discuss coronary artery atherosclerosis, aortic atherosclerosis, aortic valvular calcifications, and probable pulmonary arterial hypertension.  Discuss need to f/u with PCP regarding echocardiogram. 4.  Discuss continued smoking cessation efforts 5.  No phlebotomy today. Hematocrit is 44.9 today; goal is < 52. 6.  B12 injection today, then monthly x 3. 7.  RTC in 6 months for MD assessment, labs (CBC with diff, CMP, ferritin, folate), B12, and +/- phlebotomy.   Honor Loh, NP 02/20/2017, 12:40 PM   I saw and evaluated the patient, participating in the key portions of the service and reviewing pertinent diagnostic studies and records.  I reviewed the nurse practitioner's note and agree with the findings and the plan.  The assessment and plan were discussed with the patient. A few questions were asked by the patient and answered.   Nolon Stalls, MD 02/20/2017,12:40 PM

## 2017-02-21 LAB — ANTI-PARIETAL ANTIBODY: Parietal Cell Antibody-IgG: 2.9 Units (ref 0.0–20.0)

## 2017-03-20 ENCOUNTER — Inpatient Hospital Stay: Payer: Medicare HMO | Attending: Hematology and Oncology

## 2017-03-21 ENCOUNTER — Ambulatory Visit: Payer: Medicare HMO | Admitting: Family Medicine

## 2017-03-27 ENCOUNTER — Encounter: Payer: Self-pay | Admitting: Family Medicine

## 2017-03-27 ENCOUNTER — Ambulatory Visit: Payer: Medicare HMO | Admitting: Family Medicine

## 2017-03-27 VITALS — BP 139/80 | HR 97 | Temp 99.0°F | Wt 121.3 lb

## 2017-03-27 DIAGNOSIS — D696 Thrombocytopenia, unspecified: Secondary | ICD-10-CM

## 2017-03-27 DIAGNOSIS — E785 Hyperlipidemia, unspecified: Secondary | ICD-10-CM | POA: Diagnosis not present

## 2017-03-27 DIAGNOSIS — I10 Essential (primary) hypertension: Secondary | ICD-10-CM

## 2017-03-27 LAB — LP+ALT+AST PICCOLO, WAIVED
ALT (SGPT) Piccolo, Waived: 29 U/L (ref 10–47)
AST (SGOT) PICCOLO, WAIVED: 31 U/L (ref 11–38)
CHOLESTEROL PICCOLO, WAIVED: 156 mg/dL (ref <200)
Triglycerides Piccolo,Waived: 51 mg/dL (ref <150)
VLDL Chol Calc Piccolo,Waive: 10 mg/dL (ref <30)

## 2017-03-27 MED ORDER — AMLODIPINE BESYLATE 5 MG PO TABS: 5.0000 mg | ORAL_TABLET | Freq: Every day | ORAL | 2 refills | Status: DC

## 2017-03-27 MED ORDER — BENAZEPRIL HCL 40 MG PO TABS: 40.0000 mg | ORAL_TABLET | Freq: Every day | ORAL | 2 refills | Status: DC

## 2017-03-27 NOTE — Addendum Note (Signed)
Addended by: Golden Pop A on: 03/27/2017 03:02 PM   Modules accepted: Orders

## 2017-03-27 NOTE — Assessment & Plan Note (Signed)
The current medical regimen is effective;  continue present plan and medications.  

## 2017-03-27 NOTE — Progress Notes (Signed)
BP 139/80 (BP Location: Left Arm)   Pulse 97   Temp 99 F (37.2 C) (Temporal)   Wt 121 lb 4.8 oz (55 kg)   SpO2 97%   BMI 17.40 kg/m    Subjective:    Patient ID: Patrick Ayala, male    DOB: 07-Jul-1940, 76 y.o.   MRN: [id number]  HPI: Patrick Ayala is a 76 y.o. male  Chief Complaint  Patient presents with  . Hypertension  . Thrombocytopenia   Patient all in all doing well blood pressure good control with no complaints from medications. Lipitor also doing well with no complaints. Breathing is gotten inhalers for COPD and doing well also using oxygen with good O2 sats.  Relevant past medical, surgical, family and social history reviewed and updated as indicated. Interim medical history since our last visit reviewed. Allergies and medications reviewed and updated.  Review of Systems  Constitutional: Negative.   Respiratory: Negative.   Cardiovascular: Negative.     Per HPI unless specifically indicated above     Objective:    BP 139/80 (BP Location: Left Arm)   Pulse 97   Temp 99 F (37.2 C) (Temporal)   Wt 121 lb 4.8 oz (55 kg)   SpO2 97%   BMI 17.40 kg/m   Wt Readings from Last 3 Encounters:  03/27/17 121 lb 4.8 oz (55 kg)  02/20/17 121 lb 6 oz (55.1 kg)  11/22/16 123 lb 3.2 oz (55.9 kg)    Physical Exam  Constitutional: He is oriented to person, place, and time. He appears well-developed and well-nourished.  HENT:  Head: Normocephalic and atraumatic.  Eyes: Conjunctivae and EOM are normal.  Neck: Normal range of motion.  Cardiovascular: Normal rate, regular rhythm and normal heart sounds.  Pulmonary/Chest: Effort normal and breath sounds normal.  Musculoskeletal: Normal range of motion.  Neurological: He is alert and oriented to person, place, and time.  Skin: No erythema.  Psychiatric: He has a normal mood and affect. His behavior is normal. Judgment and thought content normal.    Results for orders placed or performed in visit on 02/20/17    Ferritin  Result Value Ref Range   Ferritin 153 24 - 336 ng/mL  Comprehensive metabolic panel  Result Value Ref Range   Sodium 133 (L) 135 - 145 mmol/L   Potassium 4.5 3.5 - 5.1 mmol/L   Chloride 86 (L) 101 - 111 mmol/L   CO2 38 (H) 22 - 32 mmol/L   Glucose, Bld 88 65 - 99 mg/dL   BUN 13 6 - 20 mg/dL   Creatinine, Ser 0.56 (L) 0.61 - 1.24 mg/dL   Calcium 9.4 8.9 - 10.3 mg/dL   Total Protein 7.3 6.5 - 8.1 g/dL   Albumin 4.4 3.5 - 5.0 g/dL   AST 26 15 - 41 U/L   ALT 18 17 - 63 U/L   Alkaline Phosphatase 57 38 - 126 U/L   Total Bilirubin 1.3 (H) 0.3 - 1.2 mg/dL   GFR calc non Af Amer >60 >60 mL/min   GFR calc Af Amer >60 >60 mL/min   Anion gap 9 5 - 15  Anti-parietal antibody  Result Value Ref Range   Parietal Cell Antibody-IgG 2.9 0.0 - 20.0 Units  Folate  Result Value Ref Range   Folate 33.0 >5.9 ng/mL  CBC with Differential/Platelet  Result Value Ref Range   WBC 9.5 3.8 - 10.6 K/uL   RBC 4.49 4.40 - 5.90 MIL/uL   Hemoglobin 15.0 13.0 -  18.0 g/dL   HCT 44.9 40.0 - 52.0 %   MCV 100.0 80.0 - 100.0 fL   MCH 33.5 26.0 - 34.0 pg   MCHC 33.5 32.0 - 36.0 g/dL   RDW 13.7 11.5 - 14.5 %   Platelets 86 (L) 150 - 440 K/uL   Neutrophils Relative % 73 %   Neutro Abs 7.0 (H) 1.4 - 6.5 K/uL   Lymphocytes Relative 19 %   Lymphs Abs 1.8 1.0 - 3.6 K/uL   Monocytes Relative 6 %   Monocytes Absolute 0.6 0.2 - 1.0 K/uL   Eosinophils Relative 1 %   Eosinophils Absolute 0.1 0 - 0.7 K/uL   Basophils Relative 1 %   Basophils Absolute 0.1 0 - 0.1 K/uL      Assessment & Plan:   Problem List Items Addressed This Visit      Cardiovascular and Mediastinum   Essential hypertension - Primary    The current medical regimen is effective;  continue present plan and medications.       Relevant Medications   amLODipine (NORVASC) 5 MG tablet   benazepril (LOTENSIN) 40 MG tablet   Other Relevant Orders   Basic metabolic panel   LP+ALT+AST Piccolo, Waived     Other   Thrombocytopenia  (HCC)    Stable       Relevant Orders   Basic metabolic panel   LP+ALT+AST Piccolo, Waived   Hyperlipidemia    The current medical regimen is effective;  continue present plan and medications.       Relevant Medications   amLODipine (NORVASC) 5 MG tablet   benazepril (LOTENSIN) 40 MG tablet       Follow up plan: Return in about 6 months (around 09/25/2017) for Physical Exam.

## 2017-03-27 NOTE — Assessment & Plan Note (Signed)
Stable

## 2017-03-28 ENCOUNTER — Encounter: Payer: Self-pay | Admitting: Family Medicine

## 2017-03-28 ENCOUNTER — Other Ambulatory Visit: Payer: Self-pay | Admitting: Family Medicine

## 2017-03-28 ENCOUNTER — Telehealth: Payer: Self-pay

## 2017-03-28 DIAGNOSIS — I10 Essential (primary) hypertension: Secondary | ICD-10-CM

## 2017-03-28 LAB — BASIC METABOLIC PANEL
BUN/Creatinine Ratio: 20 (ref 10–24)
BUN: 11 mg/dL (ref 8–27)
CALCIUM: 9.6 mg/dL (ref 8.6–10.2)
CO2: 35 mmol/L — AB (ref 20–29)
CREATININE: 0.54 mg/dL — AB (ref 0.76–1.27)
Chloride: 96 mmol/L (ref 96–106)
GFR, EST AFRICAN AMERICAN: 120 mL/min/{1.73_m2} (ref >59)
GFR, EST NON AFRICAN AMERICAN: 103 mL/min/{1.73_m2} (ref >59)
Glucose: 77 mg/dL (ref 65–99)
POTASSIUM: 5 mmol/L (ref 3.5–5.2)
Sodium: 144 mmol/L (ref 134–144)

## 2017-03-28 LAB — LIPID PANEL W/O CHOL/HDL RATIO
Cholesterol, Total: 153 mg/dL (ref 100–199)
HDL: 98 mg/dL (ref >39)
LDL CALC: 47 mg/dL (ref 0–99)
TRIGLYCERIDES: 39 mg/dL (ref 0–149)
VLDL CHOLESTEROL CAL: 8 mg/dL (ref 5–40)

## 2017-03-28 LAB — AST: AST: 30 IU/L (ref 0–40)

## 2017-03-28 LAB — ALT: ALT: 21 IU/L (ref 0–44)

## 2017-03-28 MED ORDER — BENAZEPRIL HCL 40 MG PO TABS: 40.0000 mg | ORAL_TABLET | Freq: Every day | ORAL | 2 refills | Status: DC

## 2017-03-28 NOTE — Telephone Encounter (Signed)
RX for O2 Concentrator Portable Sent to [employer].

## 2017-04-04 ENCOUNTER — Other Ambulatory Visit: Payer: Self-pay

## 2017-04-04 ENCOUNTER — Telehealth: Payer: Self-pay | Admitting: Family Medicine

## 2017-04-04 NOTE — Telephone Encounter (Signed)
Copied from [place] [id number]. Topic: Quick Communication - See Telephone Encounter >> Apr 04, 2017 10:25 AM Patrick Ayala, Helene Kelp D wrote: CRM for notification. See Telephone encounter for: 04/04/17. Patient called and would like to talk to Dr. Jeananne Rama or his CMA about sending a letter to [facility]. Patient is not sure what the letter is about but said Dr. Jeananne Rama does. Please call patient back, thanks.

## 2017-04-04 NOTE — Patient Outreach (Signed)
[facility]) Care Management  04/04/2017  Patrick Ayala 1940/05/28 [id number]   Telephone Screen  Referral Date: 04/02/17 Referral Source: [employer]  Referral Reason: " health issues with oxygen" Insurance: [facility]    Incoming call from patient. Screening completed. Patient resides in home with spouse. He voices he is independent with all ADLs/IADLs. Denies any falls and does not use any assistive devices. He is able to drive himself to appts. He voices that he only takes about four meds. He denies any issues affording and/or managing his meds. He has PMH of HTN, COPD-smoker and otherwise states he is doing well. He states he is knowledgeable regarding his conditions and how to manage them. He voices he saw PCP on last week and "everything was perfect." He uses oxygen as needed. He discussed with PCP about getting portable oxygen tanks. He voices that MD was sending prescription to oxygen supplier. Per MD office notes script sent on 03/28/17 to [facility]. Patient states that he was informed by [facility] that [facility] was their preferred provider. He has not heard anything from supplier. Patient advised and had already planned to contact MD office today to alert them of this. In the meantime, he has plenty of oxygen to use and just wanted portable tanks so he could be more mobile. Patient sees PCP about q61month. He is also followed by hematologist and goes monthly for B12 injections. Patient denies any issues or concerns or needs for further assistance. He voices he is able to contact MD regarding oxygen on his own. He voices no [facility]N needs or concerns at this time and was very appreciative of call.     Plan: RN CM will notify [facility] assistant of case status. RN CM will send TOur Lady Of Peaceletter, brochure and magnet to patient for future reference.   REnzo Montgomery RN,BSN,CCM [facility] Telephonic Care Management Coordinator Direct Phone:  [phone] Free: [phone]: [phone]

## 2017-04-04 NOTE — Patient Outreach (Signed)
[place] [facility]) Care Management  04/04/2017  VIRGIL LIGHTNER 1940/05/09 [id number]   Telephone Screen  Referral Date: 04/02/17 Referral Source: [employer]  Referral Reason: " health issues with oxygen" Insurance: [facility]   Outreach attempt # 1 to patient. No answer at present. RN CM left HIPAA compliant voicemail message along with contact info.     Plan: RN CM will make outreach attempt to patient within three business days if no return call.   Enzo Montgomery, RN,BSN,CCM [place] Management Telephonic Care Management Coordinator Direct Phone: [phone] Toll Free: [phone] Fax: [phone]

## 2017-04-06 ENCOUNTER — Ambulatory Visit: Payer: Medicare HMO

## 2017-04-06 NOTE — Telephone Encounter (Signed)
Pt returned call was asking about O2 RX. Advised that it was faxed on 03/28/17. Pt verbalized understanding and denied further questions.

## 2017-04-06 NOTE — Telephone Encounter (Signed)
Left message on machine for pt to return call to the office.

## 2017-04-17 ENCOUNTER — Inpatient Hospital Stay: Payer: Medicare HMO | Attending: Hematology and Oncology

## 2017-04-17 DIAGNOSIS — E538 Deficiency of other specified B group vitamins: Secondary | ICD-10-CM | POA: Diagnosis not present

## 2017-04-17 MED ORDER — CYANOCOBALAMIN 1000 MCG/ML IJ SOLN
1000.0000 ug | Freq: Once | INTRAMUSCULAR | Status: AC
  Administered 2017-04-17: 1000 ug via INTRAMUSCULAR

## 2017-05-15 ENCOUNTER — Inpatient Hospital Stay: Payer: Medicare HMO | Attending: Hematology and Oncology

## 2017-05-18 ENCOUNTER — Ambulatory Visit: Payer: Medicare HMO

## 2017-05-21 ENCOUNTER — Inpatient Hospital Stay: Payer: Medicare HMO

## 2017-08-17 ENCOUNTER — Telehealth: Payer: Self-pay | Admitting: Family Medicine

## 2017-08-17 NOTE — Telephone Encounter (Signed)
Copied from [place] [id number]. Topic: Medicare AWV >> Aug 17, 2017 10:49 AM Leo Rod wrote: Called to schedule Medicare Annual Wellness Visit with Nurse Health Advisor. If patient returns call please note: their last AWV was on 07/19/15 please schedule AWV with [facility] ANY date  Thank you! For any questions please contact: Jill Alexanders [phone]  Skype [email or web address]

## 2017-08-20 ENCOUNTER — Other Ambulatory Visit: Payer: Self-pay | Admitting: *Deleted

## 2017-08-20 DIAGNOSIS — D751 Secondary polycythemia: Secondary | ICD-10-CM

## 2017-08-21 ENCOUNTER — Inpatient Hospital Stay: Payer: Medicare HMO | Admitting: Hematology and Oncology

## 2017-08-21 ENCOUNTER — Inpatient Hospital Stay: Payer: Medicare HMO

## 2017-08-21 NOTE — Progress Notes (Deleted)
[facility] day:  08/21/17  Chief Complaint: Patrick Ayala is a 77 y.o. male with secondary polycythemia, thrombocytopenia and B12 deficiency who is seen for 6 month assessment.  HPI: The patient was last seen in the medical oncology clinic on 02/20/2017.  At that time, he denied any new complaints.  Exam revealed right sided wheezing.  Hematocrit was 44.9.  Platelet count was 86,000.  Ferritin was 153.  Folate was 33.  He received B12 on 02/20/2017 and 04/17/2017.  During the interim,   Past Medical History:  Diagnosis Date  . Carboxyhemoglobinemia   . COPD (chronic obstructive pulmonary disease) ([place])   . Polycythemia, secondary 10/20/2014  . Thrombocytopenia ([place])     Past Surgical History:  Procedure Laterality Date  . APPENDECTOMY    . EYE SURGERY Right    cataract  . HERNIA REPAIR    . polycythemia      Family History  Problem Relation Age of Onset  . Cancer Mother   . Cancer Sister   . Cancer Brother     Social History:  reports that he has been smoking cigarettes.  He has been smoking about 0.50 packs per day. He has never used smokeless tobacco. He reports that he drinks about 8.4 oz of alcohol per week. He reports that he does not use drugs.  He works clearing off land with a bobcat.  He smokes 1/2 pack per day.  He drinks 2 beers per night.  He lives in [place].  He is alone today.  Allergies:  Allergies  Allergen Reactions  . No Known Allergies     Current Medications: Current Outpatient Medications  Medication Sig Dispense Refill  . albuterol (PROVENTIL HFA;VENTOLIN HFA) 108 (90 Base) MCG/ACT inhaler Inhale 1-2 puffs into the lungs every 6 (six) hours as needed for wheezing or shortness of breath. 1 Inhaler 2  . amLODipine (NORVASC) 5 MG tablet Take 1 tablet (5 mg total) by mouth daily. 90 tablet 2  . aspirin 81 MG tablet ASA 81 mg po once a day (start taking it from 08/02/2013)   Quantity: 0;  Refills:  0  Active    . atorvastatin (LIPITOR) 20 MG tablet Take 1 tablet (20 mg total) daily by mouth. 90 tablet 4  . benazepril (LOTENSIN) 40 MG tablet Take 1 tablet (40 mg total) by mouth daily. 90 tablet 2  . budesonide-formoterol (SYMBICORT) 160-4.5 MCG/ACT inhaler Inhale 2 puffs into the lungs every morning. INHALE 2 TIMES DAILY 3 Inhaler 4  . SYMBICORT 160-4.5 MCG/ACT inhaler INHALE 2 PUFFS INTO THE LUNGS EVERY MORNING. INHALE 2 TIMES DAILY 30.6 Inhaler 3   No current facility-administered medications for this visit.     Review of Systems:  GENERAL:  Feels "really good".  No fevers or sweats.  Weight down 2 pounds. PERFORMANCE STATUS (ECOG):  1 HEENT:  Needs dental extractions.  No visual changes, runny nose, sore throat, mouth sores or tenderness. Lungs: Shortness of breath on exertion.  Chronic cough for years.  Some wheezing.  No hemoptysis. Cardiac:  No chest pain, palpitations, orthopnea, or PND. GI:  No nausea, vomiting, diarrhea, constipation, melena or hematochezia. GU:  Difficulty starting stream.  No urgency, frequency, dysuria, or hematuria. Musculoskeletal:  No back pain.  No joint pain.  No muscle tenderness. Extremities:  No pain or swelling. Skin:  Easy bruising. No rashes or skin changes. Neuro:  No headache, numbness or weakness, balance or coordination issues. Endocrine:  No diabetes, thyroid issues, hot flashes or night sweats. Psych:  No mood changes, depression or anxiety. No difficulty sleeping. Pain:  No focal pain. Review of systems:  All other systems reviewed and found to be negative.  GENERAL:  Feels good.  Active.  No fevers, sweats or weight loss. PERFORMANCE STATUS (ECOG):  *** HEENT:  No visual changes, runny nose, sore throat, mouth sores or tenderness. Lungs: No shortness of breath or cough.  No hemoptysis. Cardiac:  No chest pain, palpitations, orthopnea, or PND. GI:  No nausea, vomiting, diarrhea, constipation, melena or hematochezia. GU:  No urgency,  frequency, dysuria, or hematuria. Musculoskeletal:  No back pain.  No joint pain.  No muscle tenderness. Extremities:  No pain or swelling. Skin:  No rashes or skin changes. Neuro:  No headache, numbness or weakness, balance or coordination issues. Endocrine:  No diabetes, thyroid issues, hot flashes or night sweats. Psych:  No mood changes, depression or anxiety. Pain:  No focal pain. Review of systems:  All other systems reviewed and found to be negative.   Physical Exam: There were no vitals taken for this visit. GENERAL:  Thin gentleman sitting comfortably in the exam room in no acute distress. MENTAL STATUS:  Alert and oriented to person, place and time. HEAD: Wearing a cap.  Gray hair and [employer].  Normocephalic, atraumatic, face symmetric, no Cushingoid features. EYES:  Brown eyes.  Ruddy sclera.  Pupils equal round and reactive to light and accomodation.  No conjunctivitis or scleral icterus. ENT:  Oropharynx clear without lesion. Poor dentition, missing teeth. Tongue normal. Mucous membranes moist.  RESPIRATORY:  On 2L O2. Right sided wheezes.  Otherwise clear to auscultation without rales or rhonchi. CARDIOVASCULAR:  Regular rate and rhythm without murmur, rub or gallop. ABDOMEN:  Ventral hernia.  Firm abdomen.  Soft, non-tender, with active bowel sounds, and no appreciable hepatosplenomegaly.  No masses. SKIN:  Nicotine stained nails.  Bruising on left forearm. No rashes, ulcers or lesions. EXTREMITIES: No edema, no skin discoloration or tenderness.  No palpable cords. LYMPH NODES: No palpable cervical, supraclavicular, axillary or inguinal adenopathy  NEUROLOGICAL: Unremarkable. PSYCH:  Appropriate.  GENERAL:  Well developed, well nourished, gentleman sitting comfortably in the exam room in no acute distress. MENTAL STATUS:  Alert and oriented to person, place and time. HEAD:  *** hair.  Normocephalic, atraumatic, face symmetric, no Cushingoid features. EYES:  *** eyes.   Pupils equal round and reactive to light and accomodation.  No conjunctivitis or scleral icterus. ENT:  Oropharynx clear without lesion.  Tongue normal. Mucous membranes moist.  RESPIRATORY:  Clear to auscultation without rales, wheezes or rhonchi. CARDIOVASCULAR:  Regular rate and rhythm without murmur, rub or gallop. ABDOMEN:  Soft, non-tender, with active bowel sounds, and no hepatosplenomegaly.  No masses. SKIN:  No rashes, ulcers or lesions. EXTREMITIES: No edema, no skin discoloration or tenderness.  No palpable cords. LYMPH NODES: No palpable cervical, supraclavicular, axillary or inguinal adenopathy  NEUROLOGICAL: Unremarkable. PSYCH:  Appropriate.    No visits with results within 3 Day(s) from this visit.  Latest known visit with results is:  Office Visit on 03/27/2017  Component Date Value Ref Range Status  . Glucose 03/27/2017 77  65 - 99 mg/dL Final  . BUN 03/27/2017 11  8 - 27 mg/dL Final  . Creatinine, Ser 03/27/2017 0.54* 0.76 - 1.27 mg/dL Final  . GFR calc non Af Amer 03/27/2017 103  >59 mL/min/1.73 Final  . GFR calc Af Amer 03/27/2017 120  >59  mL/min/1.73 Final  . BUN/Creatinine Ratio 03/27/2017 20  10 - 24 Final  . Sodium 03/27/2017 144  134 - 144 mmol/L Final  . Potassium 03/27/2017 5.0  3.5 - 5.2 mmol/L Final  . Chloride 03/27/2017 96  96 - 106 mmol/L Final  . CO2 03/27/2017 35* 20 - 29 mmol/L Final  . Calcium 03/27/2017 9.6  8.6 - 10.2 mg/dL Final  . ALT (SGPT) Piccolo, Waived 03/27/2017 29  10 - 47 U/L Final  . AST (SGOT) Piccolo, Waived 03/27/2017 31  11 - 38 U/L Final  . Cholesterol Piccolo, Waived 03/27/2017 156  <200 mg/dL Final   Comment:                         Desirable                <200                         Borderline High      200- 239                         High                     >239   . HDL Chol Piccolo, Waived 03/27/2017 >100  >59 mg/dL Final   Comment:                         Low HDL- Risk Factor     < 40                         High  HDL- Negative       > 59                          Risk Factor (Desirable)   . Triglycerides Piccolo,Waived 03/27/2017 51  <150 mg/dL Final   Comment:                         Normal                   <150                         Borderline High     150 - 199                         High                200 - 499                         Very High                >499   . VLDL Chol Calc Piccolo,Waive 03/27/2017 10  <30 mg/dL Final   Comment:                         Normal                   < 30  High                     > 29   . Cholesterol, Total 03/27/2017 153  100 - 199 mg/dL Final  . Triglycerides 03/27/2017 39  0 - 149 mg/dL Final  . HDL 03/27/2017 98  >39 mg/dL Final  . VLDL Cholesterol Cal 03/27/2017 8  5 - 40 mg/dL Final  . LDL Calculated 03/27/2017 47  0 - 99 mg/dL Final  . AST 03/27/2017 30  0 - 40 IU/L Final  . ALT 03/27/2017 21  0 - 44 IU/L Final    Assessment:  Patrick Ayala is a 77 y.o. male with secondary polycythemia and thrombocytopenia of unclear etiology.  He smokes and has a history of an elevated carboxyhemoglobin as high as 23% attributed to smoking in a small unventilated room for many hours and a truck exhaust leak (fixed about a year ago).  He has carbon monoxide detectors. He currently smokes 1/2 pack per day, for the past 6 weeks, reduced from 1 pack per day previously.  He is contemplating and preparing to quit, has nicotine patches.  Platelet count has ranged from 61,000 to 139,000 without trend since 09/2012.  Prior work-up noted the following normal labs:  erythropoietin level, iron studies, JAK2, HIV, ANA, and hepatitis C. Ultrasound revealed no splenomegaly. 89,000 today (06/16/2016).  Chest CT on 06/23/2015 revealed resolution of the left upper lobe pulmonary nodule.  Low dose chest CT on 07/03/2016 revealed new bilateral upper lobe pulmonary nodules, including a dominant ill-defined left upper lobe pulmonary nodule.   Chest CT on  01/17/2017 revealed multiple small pulmonary nodules throughout the lungs bilaterally. The largest nodule on the prior study had completely resolved, indicative of a benign etiology. The largest nodule had a volume derived mean diameter 4.1 mm aspect of the right upper lobe and was stable compared to prior examinations.  There were no larger more suspicious appearing pulmonary nodules or masses are otherwise noted.  There was mild diffuse bronchial wall thickening with severe centrilobular and paraseptal emphysema.  He has had multiple phlebotomies beginning in 09/2012 (last 11/19/2015). He undergoes small phlebotomies (250-300 cc) with supplemental IVF to maintain a hematocrit less than 52.  He has B12 deficiency.  B12 was 260 (low normal) on 12/07/2014.  MMA was elevated c/w B12 deficiency.  He was started on B12 weekly x 6 (02/01/2015 - 03/01/2015) prior to a monthly schedule (last 04/17/2017).  Intrinsic factor antibodies and parietal cell antibodies were negative.  Folate was 33 on 02/20/2018.    Colonoscopy on 07/29/2013 revealed 3 polyps.  Two had histology suggestive of tubular adenoma.   Symptomatically, he denies any new complaints.  Exam reveals right sided wheezing.  Hematocrit is 44.9.  Platelet count is 86,000.  Plan: 1.  Labs today:  CBC with diff, CMP, ferritin, folate. 2.  Discuss monthly B12. 3.  Chest CT LCS nodule follow-up w/o contrast on 01/17/2018.   2.  Review interval low dose chest CT.  Anticipate next CT on 01/17/2018. 3.  Discuss coronary artery atherosclerosis, aortic atherosclerosis, aortic valvular calcifications, and probable pulmonary arterial hypertension.  Discuss need to f/u with PCP regarding echocardiogram. 4.  Discuss continued smoking cessation efforts 5.  No phlebotomy today. Hematocrit is 44.9 today; goal is < 52. 6.  B12 injection today, then monthly x 3. 7.  RTC in 6 months for MD assessment, labs (CBC with diff, CMP, ferritin, folate), B12, and +/-  phlebotomy.  Lequita Asal, MD 08/21/2017, 5:39 AM   I saw and evaluated the patient, participating in the key portions of the service and reviewing pertinent diagnostic studies and records.  I reviewed the nurse practitioner's note and agree with the findings and the plan.  The assessment and plan were discussed with the patient. A few questions were asked by the patient and answered.   Nolon Stalls, MD 08/21/2017,5:39 AM

## 2017-09-01 IMAGING — CT CT CHEST LUNG CANCER SCREENING LOW DOSE W/O CM
1 of 3 series · 10 of 30 positions shown, 13 images · non-contrast
Comparison: 10/24/2013

CLINICAL DATA: Cough.  Smoker.

EXAM:
CT CHEST WITHOUT CONTRAST
TECHNIQUE: Multidetector CT imaging of the chest was performed following the
standard protocol without IV contrast.

[ct lung segmentation data · axial · 0.68mm/px · z∈[-683,-683]mm · 10 of 327 frames shown]
[frame 1/327  mediastinal]
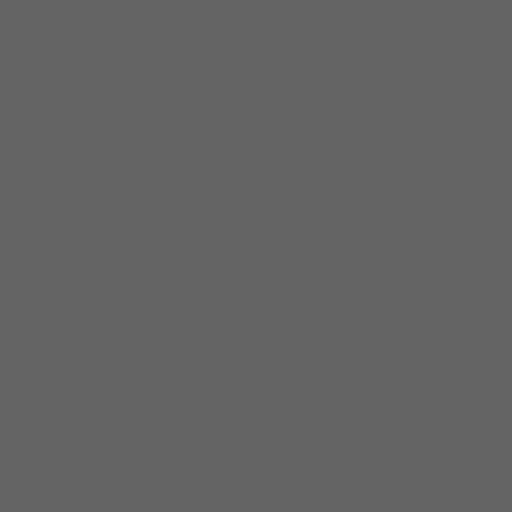
[frame 1/327  lung]
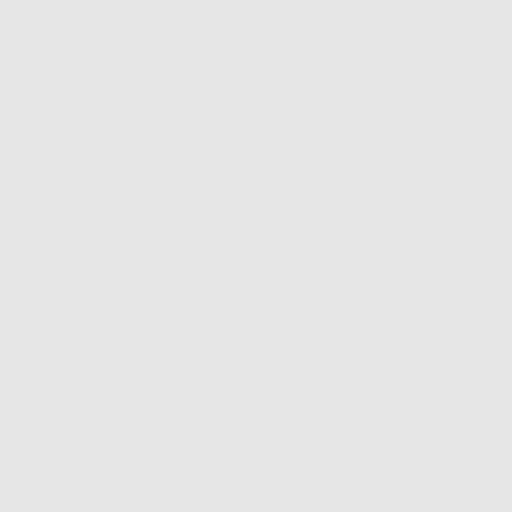
[frame 37/327  lung]
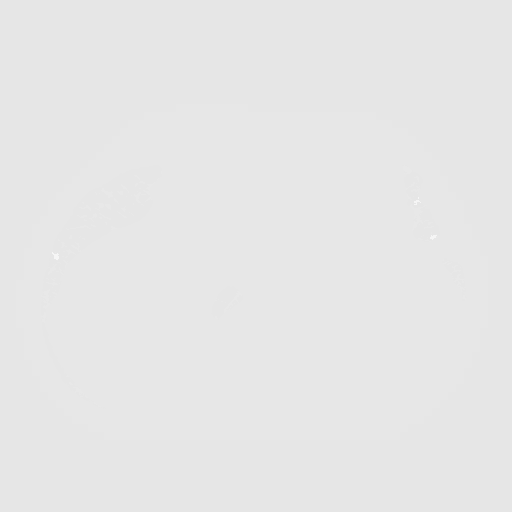
[frame 73/327  lung]
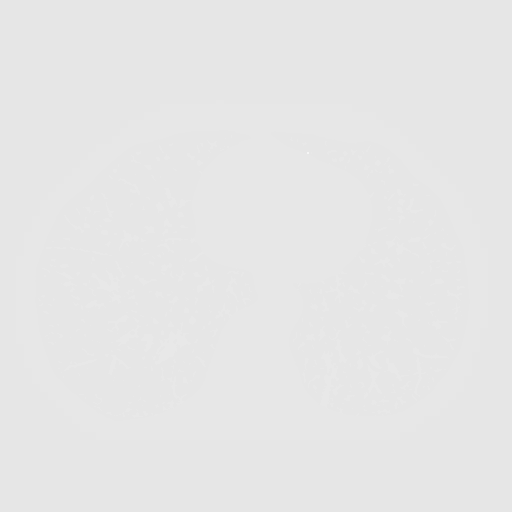
[frame 109/327  lung]
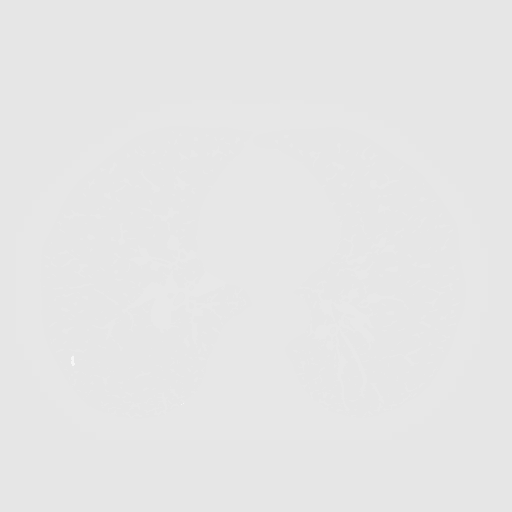
[frame 145/327  mediastinal]
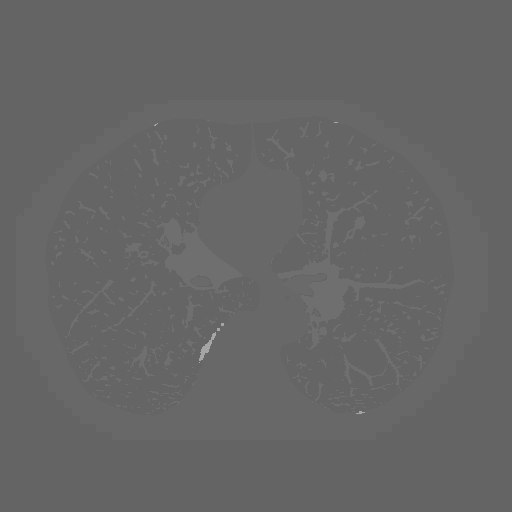
[frame 145/327  lung]
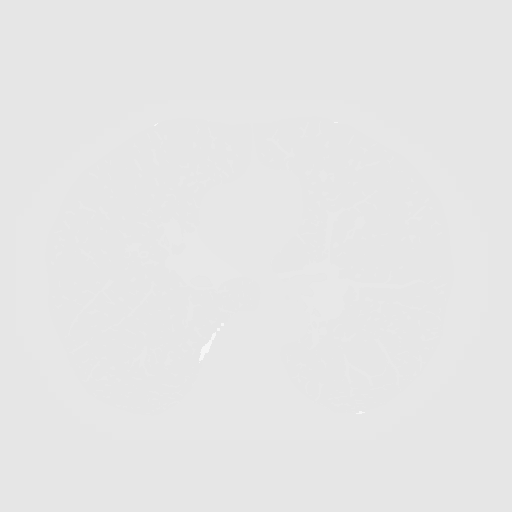
[frame 182/327  lung]
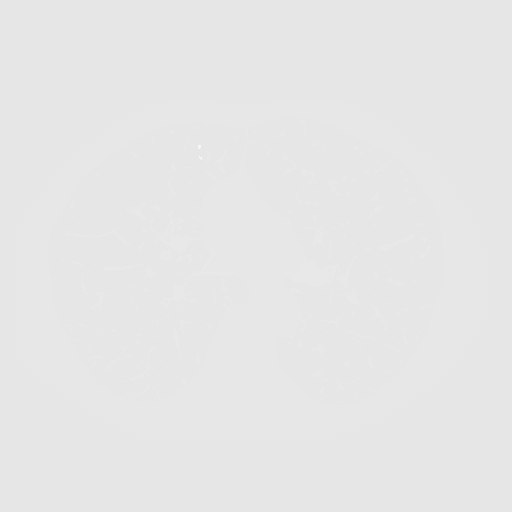
[frame 218/327  lung]
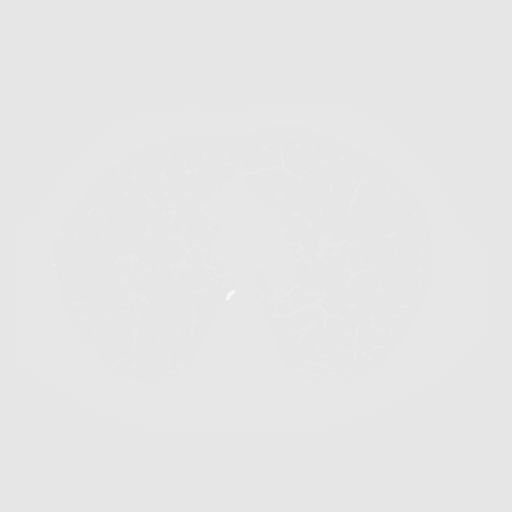
[frame 254/327  lung]
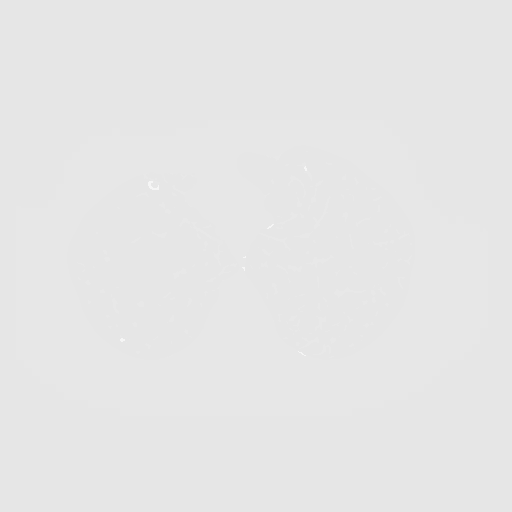
[frame 290/327  mediastinal]
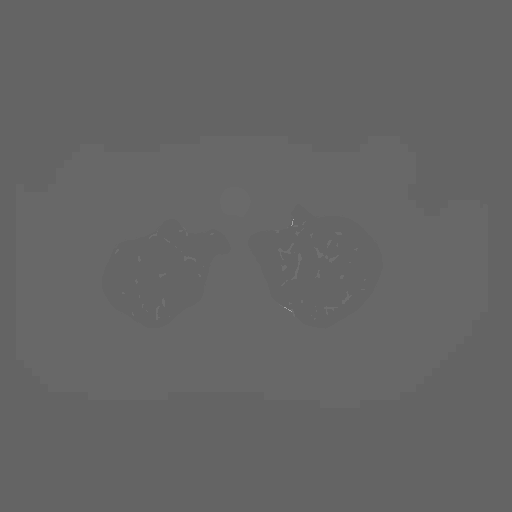
[frame 290/327  lung]
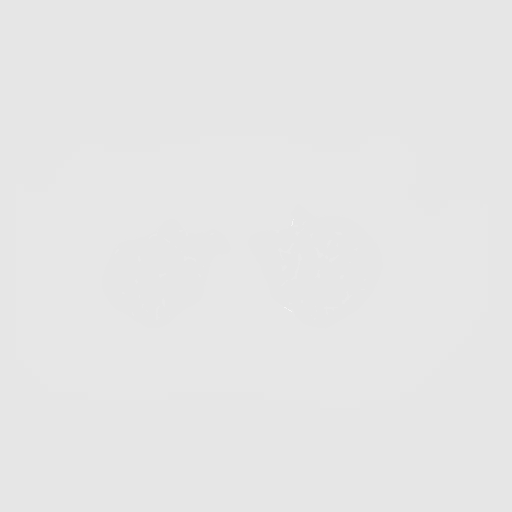
[frame 327/327  lung]
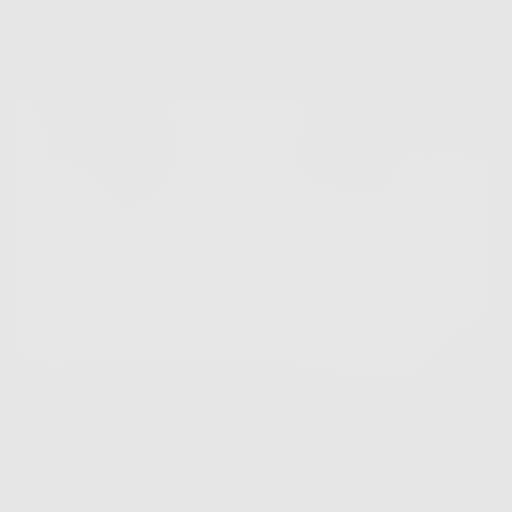

[10 of 30 positions shown; findings below may reference images not displayed]

FINDINGS: Mediastinum: The heart size appears normal. There is no pericardial
effusion. Aortic atherosclerosis noted. The trachea appears patent
and is midline. Unremarkable appearance of the esophagus. No
mediastinal or hilar adenopathy.

Lungs/Pleura: There is no pleural effusion. There is advanced
changes of centrilobular emphysema. Scarring noted within the
posterior lower lobes. New left upper lobe nodule measures 4 mm,
image 65 of series 8.

Upper Abdomen: The visualized portions of the liver are normal. The
adrenal glands are unremarkable. Normal appearance of both kidneys
and spleen.

Musculoskeletal: No aggressive lytic or sclerotic bone lesion.
IMPRESSION: 1. New left upper lobe lung nodule measures 4 mm. If the patient is
at high risk for bronchogenic carcinoma, follow-up chest CT at 1
year is recommended. If the patient is at low risk, no follow-up is
needed. This recommendation follows the consensus statement:
Guidelines for Management of Small Pulmonary Nodules Detected on CT
Scans: A Statement from the [HOSPITAL] as published in
2. Emphysema
3. Aortic atherosclerosis.

## 2017-09-06 ENCOUNTER — Inpatient Hospital Stay: Payer: Medicare HMO | Attending: Hematology and Oncology

## 2017-09-06 ENCOUNTER — Encounter: Payer: Self-pay | Admitting: Hematology and Oncology

## 2017-09-06 ENCOUNTER — Inpatient Hospital Stay: Payer: Medicare HMO

## 2017-09-06 ENCOUNTER — Inpatient Hospital Stay: Payer: Medicare HMO | Admitting: Hematology and Oncology

## 2017-09-06 VITALS — BP 136/65 | HR 92 | Temp 97.3°F | Ht 70.0 in | Wt 120.1 lb

## 2017-09-06 DIAGNOSIS — R918 Other nonspecific abnormal finding of lung field: Secondary | ICD-10-CM

## 2017-09-06 DIAGNOSIS — E538 Deficiency of other specified B group vitamins: Secondary | ICD-10-CM

## 2017-09-06 DIAGNOSIS — R5383 Other fatigue: Secondary | ICD-10-CM | POA: Diagnosis not present

## 2017-09-06 DIAGNOSIS — J449 Chronic obstructive pulmonary disease, unspecified: Secondary | ICD-10-CM

## 2017-09-06 DIAGNOSIS — F17210 Nicotine dependence, cigarettes, uncomplicated: Secondary | ICD-10-CM

## 2017-09-06 DIAGNOSIS — D696 Thrombocytopenia, unspecified: Secondary | ICD-10-CM | POA: Diagnosis not present

## 2017-09-06 DIAGNOSIS — Z79899 Other long term (current) drug therapy: Secondary | ICD-10-CM | POA: Insufficient documentation

## 2017-09-06 DIAGNOSIS — Z7982 Long term (current) use of aspirin: Secondary | ICD-10-CM | POA: Insufficient documentation

## 2017-09-06 DIAGNOSIS — Z9119 Patient's noncompliance with other medical treatment and regimen: Secondary | ICD-10-CM | POA: Insufficient documentation

## 2017-09-06 DIAGNOSIS — D751 Secondary polycythemia: Secondary | ICD-10-CM | POA: Insufficient documentation

## 2017-09-06 LAB — CBC WITH DIFFERENTIAL/PLATELET
BASOS ABS: 0 10*3/uL (ref 0–0.1)
Basophils Relative: 0 %
Eosinophils Absolute: 0.1 10*3/uL (ref 0–0.7)
Eosinophils Relative: 1 %
HEMATOCRIT: 40.4 % (ref 40.0–52.0)
HEMOGLOBIN: 13.9 g/dL (ref 13.0–18.0)
Lymphocytes Relative: 20 %
Lymphs Abs: 1.4 10*3/uL (ref 1.0–3.6)
MCH: 34.6 pg — ABNORMAL HIGH (ref 26.0–34.0)
MCHC: 34.3 g/dL (ref 32.0–36.0)
MCV: 101 fL — ABNORMAL HIGH (ref 80.0–100.0)
Monocytes Absolute: 0.6 10*3/uL (ref 0.2–1.0)
Monocytes Relative: 8 %
NEUTROS ABS: 5.2 10*3/uL (ref 1.4–6.5)
NEUTROS PCT: 71 %
Platelets: 86 10*3/uL — ABNORMAL LOW (ref 150–440)
RBC: 4 MIL/uL — ABNORMAL LOW (ref 4.40–5.90)
RDW: 13.6 % (ref 11.5–14.5)
WBC: 7.3 10*3/uL (ref 3.8–10.6)

## 2017-09-06 LAB — COMPREHENSIVE METABOLIC PANEL
ALK PHOS: 48 U/L (ref 38–126)
ALT: 20 U/L (ref 17–63)
ANION GAP: 10 (ref 5–15)
AST: 29 U/L (ref 15–41)
Albumin: 4.5 g/dL (ref 3.5–5.0)
BILIRUBIN TOTAL: 1.2 mg/dL (ref 0.3–1.2)
BUN: 12 mg/dL (ref 6–20)
CO2: 38 mmol/L — ABNORMAL HIGH (ref 22–32)
Calcium: 9.7 mg/dL (ref 8.9–10.3)
Chloride: 89 mmol/L — ABNORMAL LOW (ref 101–111)
Creatinine, Ser: 0.63 mg/dL (ref 0.61–1.24)
GFR calc non Af Amer: >60 mL/min (ref >60)
Glucose, Bld: 100 mg/dL — ABNORMAL HIGH (ref 65–99)
Potassium: 4.5 mmol/L (ref 3.5–5.1)
Sodium: 137 mmol/L (ref 135–145)
TOTAL PROTEIN: 7.4 g/dL (ref 6.5–8.1)

## 2017-09-06 LAB — FERRITIN: Ferritin: 173 ng/mL (ref 24–336)

## 2017-09-06 LAB — FOLATE: Folate: 34 ng/mL (ref >5.9)

## 2017-09-06 MED ORDER — CYANOCOBALAMIN 1000 MCG/ML IJ SOLN
1000.0000 ug | Freq: Once | INTRAMUSCULAR | Status: AC
  Administered 2017-09-06: 1000 ug via INTRAMUSCULAR

## 2017-09-06 NOTE — Progress Notes (Signed)
[facility] day:  09/06/17  Chief Complaint: Patrick Ayala is a 77 y.o. male with secondary polycythemia, thrombocytopenia and B12 deficiency who is seen for 6 month assessment.  HPI: The patient was last seen in the medical oncology clinic on 02/20/2017.  At that time, he denied any new complaints.  Exam revealed right sided wheezing.  Hematocrit was 44.9.  Platelet count was 86,000.  Ferritin was 153.  Folate was 33.  He received B12 on 02/20/2017 and 04/17/2017.  During the interim, patient is doing "ok". Patient denies increased shortness of breath. No chest pains. He remains on supplemental oxygen. Patient denies bleeding; no hematochezia, melena, or gross hematuria. He has no areas of unexplained bruising. Patient continues to have dental issues. He notes a poor appetite. Weight is down 1 pound.   Patient denies pain in the clinic today. Patient continues to smoke, however he has reduced his cigarette count to 3-4 day.    Past Medical History:  Diagnosis Date  . Carboxyhemoglobinemia   . COPD (chronic obstructive pulmonary disease) ([place])   . Polycythemia, secondary 10/20/2014  . Thrombocytopenia ([place])     Past Surgical History:  Procedure Laterality Date  . APPENDECTOMY    . EYE SURGERY Right    cataract  . HERNIA REPAIR    . polycythemia      Family History  Problem Relation Age of Onset  . Cancer Mother   . Cancer Sister   . Cancer Brother     Social History:  reports that he has been smoking cigarettes.  He has been smoking about 0.50 packs per day. He has never used smokeless tobacco. He reports that he drinks about 8.4 oz of alcohol per week. He reports that he does not use drugs.  He works clearing off land with a bobcat.  He smokes 1/2 pack per day.  He drinks 2 beers per night.  He lives in [place].  He is alone today.  Allergies:  Allergies  Allergen Reactions  . No Known Allergies     Current  Medications: Current Outpatient Medications  Medication Sig Dispense Refill  . amLODipine (NORVASC) 5 MG tablet Take 1 tablet (5 mg total) by mouth daily. 90 tablet 2  . aspirin 81 MG tablet ASA 81 mg po once a day (start taking it from 08/02/2013)   Quantity: 0;  Refills: 0  Active    . atorvastatin (LIPITOR) 20 MG tablet Take 1 tablet (20 mg total) daily by mouth. 90 tablet 4  . benazepril (LOTENSIN) 40 MG tablet Take 1 tablet (40 mg total) by mouth daily. 90 tablet 2  . budesonide-formoterol (SYMBICORT) 160-4.5 MCG/ACT inhaler Inhale 2 puffs into the lungs every morning. INHALE 2 TIMES DAILY 3 Inhaler 4  . SYMBICORT 160-4.5 MCG/ACT inhaler INHALE 2 PUFFS INTO THE LUNGS EVERY MORNING. INHALE 2 TIMES DAILY 30.6 Inhaler 3  . albuterol (PROVENTIL HFA;VENTOLIN HFA) 108 (90 Base) MCG/ACT inhaler Inhale 1-2 puffs into the lungs every 6 (six) hours as needed for wheezing or shortness of breath. (Patient not taking: Reported on 09/06/2017) 1 Inhaler 2   No current facility-administered medications for this visit.     Review of Systems  Constitutional: Positive for malaise/fatigue and weight loss (down 1 pound). Negative for diaphoresis and fever.  HENT: Negative for nosebleeds and sore throat.        Dental issues; needs multiple extractions.   Eyes: Negative.   Respiratory: Positive for cough (  chronic) and shortness of breath (chronic; on supplemental oxygen). Negative for hemoptysis and sputum production.   Cardiovascular: Negative for chest pain, palpitations, orthopnea, leg swelling and PND.  Gastrointestinal: Negative for abdominal pain, blood in stool, constipation, diarrhea, melena, nausea and vomiting.  Genitourinary: Negative for dysuria, frequency, hematuria and urgency.       Difficulty with initiating urinary stream  Musculoskeletal: Negative for back pain, falls, joint pain and myalgias.  Skin: Negative for itching and rash.  Neurological: Negative for dizziness, tremors, weakness and  headaches.  Endo/Heme/Allergies: Bruises/bleeds easily.  Psychiatric/Behavioral: Negative for depression, memory loss and suicidal ideas. The patient is not nervous/anxious and does not have insomnia.   All other systems reviewed and are negative.   Performance status (ECOG): 1 - Symptomatic but completely ambulatory  Physical Exam: Blood pressure 136/65, pulse 92, temperature (!) 97.3 F (36.3 C), temperature source Tympanic, height _0  (1.778 m), weight 120 lb 1.6 oz (54.5 kg), SpO2 91 %. GENERAL: Thin gentleman sitting comfortably in the exam room in no acute distress. MENTAL STATUS:  Alert and oriented to person, place and time. HEAD:  Wearing a gray cap.  Gray hair.  Normocephalic, atraumatic, face symmetric, no Cushingoid features. EYES:  Brown eyes.  Ruddy sclera.  Pupils equal round and reactive to light and accomodation.  No conjunctivitis or scleral icterus. ENT:  Oropharynx clear without lesion.  Tongue normal. Mucous membranes moist.  RESPIRATORY:  Oxygen 2.5 liters/min via [place].  Clear to auscultation without rales, wheezes or rhonchi. CARDIOVASCULAR:  Regular rate and rhythm without murmur, rub or gallop. ABDOMEN: Ventral hernia.   Soft, non-tender, with active bowel sounds, and no hepatosplenomegaly.  No masses. SKIN:  Nicotine stained nails.  No rashes, ulcers or lesions. EXTREMITIES: No edema, no skin discoloration or tenderness.  No palpable cords. LYMPH NODES: No palpable cervical, supraclavicular, axillary or inguinal adenopathy  NEUROLOGICAL: Unremarkable. PSYCH:  Appropriate.    Appointment on 09/06/2017  Component Date Value Ref Range Status  . Sodium 09/06/2017 137  135 - 145 mmol/L Final  . Potassium 09/06/2017 4.5  3.5 - 5.1 mmol/L Final  . Chloride 09/06/2017 89* 101 - 111 mmol/L Final  . CO2 09/06/2017 38* 22 - 32 mmol/L Final  . Glucose, Bld 09/06/2017 100* 65 - 99 mg/dL Final  . BUN 09/06/2017 12  6 - 20 mg/dL Final  . Creatinine, Ser 09/06/2017 0.63   0.61 - 1.24 mg/dL Final  . Calcium 09/06/2017 9.7  8.9 - 10.3 mg/dL Final  . Total Protein 09/06/2017 7.4  6.5 - 8.1 g/dL Final  . Albumin 09/06/2017 4.5  3.5 - 5.0 g/dL Final  . AST 09/06/2017 29  15 - 41 U/L Final  . ALT 09/06/2017 20  17 - 63 U/L Final  . Alkaline Phosphatase 09/06/2017 48  38 - 126 U/L Final  . Total Bilirubin 09/06/2017 1.2  0.3 - 1.2 mg/dL Final  . GFR calc non Af Amer 09/06/2017 >60  >60 mL/min Final  . GFR calc Af Amer 09/06/2017 >60  >60 mL/min Final   Comment: (NOTE) The eGFR has been calculated using the CKD EPI equation. This calculation has not been validated in all clinical situations. eGFR's persistently <60 mL/min signify possible Chronic Kidney Disease.   Georgiann Hahn gap 09/06/2017 10  5 - 15 Final   Performed at [facility], [address] [place], [place] [address]  . WBC 09/06/2017 7.3  3.8 - 10.6 K/uL Final  . RBC 09/06/2017 4.00* 4.40 - 5.90  MIL/uL Final  . Hemoglobin 09/06/2017 13.9  13.0 - 18.0 g/dL Final  . HCT 09/06/2017 40.4  40.0 - 52.0 % Final  . MCV 09/06/2017 101.0* 80.0 - 100.0 fL Final  . MCH 09/06/2017 34.6* 26.0 - 34.0 pg Final  . MCHC 09/06/2017 34.3  32.0 - 36.0 g/dL Final  . RDW 09/06/2017 13.6  11.5 - 14.5 % Final  . Platelets 09/06/2017 86* 150 - 440 K/uL Final  . Neutrophils Relative % 09/06/2017 71  % Final  . Neutro Abs 09/06/2017 5.2  1.4 - 6.5 K/uL Final  . Lymphocytes Relative 09/06/2017 20  % Final  . Lymphs Abs 09/06/2017 1.4  1.0 - 3.6 K/uL Final  . Monocytes Relative 09/06/2017 8  % Final  . Monocytes Absolute 09/06/2017 0.6  0.2 - 1.0 K/uL Final  . Eosinophils Relative 09/06/2017 1  % Final  . Eosinophils Absolute 09/06/2017 0.1  0 - 0.7 K/uL Final  . Basophils Relative 09/06/2017 0  % Final  . Basophils Absolute 09/06/2017 0.0  0 - 0.1 K/uL Final   Performed at [facility], [address] [place], [place] [address]    Assessment:  Patrick Ayala is a 77 y.o. male with secondary polycythemia and  thrombocytopenia of unclear etiology.  He smokes and has a history of an elevated carboxyhemoglobin as high as 23% attributed to smoking in a small unventilated room for many hours and a truck exhaust leak (fixed about a year ago).  He has carbon monoxide detectors. He currently smokes 1/2 pack per day, for the past 6 weeks, reduced from 1 pack per day previously.  He is contemplating and preparing to quit, has nicotine patches.  Platelet count has ranged from 61,000 to 139,000 without trend since 09/2012.  Prior work-up noted the following normal labs:  erythropoietin level, iron studies, JAK2, HIV, ANA, and hepatitis C. Ultrasound revealed no splenomegaly. 86,000 today (09/06/2016).  Chest CT on 06/23/2015 revealed resolution of the left upper lobe pulmonary nodule.  Low dose chest CT on 07/03/2016 revealed new bilateral upper lobe pulmonary nodules, including a dominant ill-defined left upper lobe pulmonary nodule.   Chest CT on 01/17/2017 revealed multiple small pulmonary nodules throughout the lungs bilaterally. The largest nodule on the prior study had completely resolved, indicative of a benign etiology. The largest nodule had a volume derived mean diameter 4.1 mm aspect of the right upper lobe and was stable compared to prior examinations.  There were no larger more suspicious appearing pulmonary nodules or masses are otherwise noted.  There was mild diffuse bronchial wall thickening with severe centrilobular and paraseptal emphysema.  He has had multiple phlebotomies beginning in 09/2012 (last 11/19/2015). He undergoes small phlebotomies (250-300 cc) with supplemental IVF to maintain a hematocrit less than 52.  He has B12 deficiency.  B12 was 260 (low normal) on 12/07/2014.  MMA was elevated c/w B12 deficiency.  He was started on B12 weekly x 6 (02/01/2015 - 03/01/2015) prior to a monthly schedule (last 04/17/2017).  Intrinsic factor antibodies and parietal cell antibodies were negative.  Folate  was 33 on 02/20/2018.    Colonoscopy on 07/29/2013 revealed 3 polyps.  Two had histology suggestive of tubular adenoma.   Symptomatically, he denies any new complaints. He has chronic shortness of breath on supplemental oxygen.  Exam stable.  Hematocrit is 40.4.  Platelet count is 86,000.  Plan: 1.  Labs today:  CBC with diff, CMP, ferritin, folate. 2.  Discuss monthly B12. He has been non-compliant  due to not having scheduled appointments. Will restart today.  3.  Chest CT LCS nodule follow-up w/o contrast on 01/17/2018. 4.  Discuss continued smoking cessation efforts 5.  No phlebotomy today. Hematocrit is 40.4 today; goal is < 52. 6.  RTC in 6 months for MD assessment, labs (CBC with diff, CMP, ferritin, folate), B12, and +/- phlebotomy.   Honor Loh, NP 09/06/2017, 10:45 AM   I saw and evaluated the patient, participating in the key portions of the service and reviewing pertinent diagnostic studies and records.  I reviewed the nurse practitioner's note and agree with the findings and the plan.  The assessment and plan were discussed with the patient. A few questions were asked by the patient and answered.   Nolon Stalls, MD 09/06/2017,10:45 AM

## 2017-09-06 NOTE — Progress Notes (Signed)
Per Rodena Piety RN per Dr. Mike Gip no phlebotomy today B12 injection only.

## 2017-09-06 NOTE — Progress Notes (Signed)
No new changes noted today 

## 2017-09-14 DIAGNOSIS — H2512 Age-related nuclear cataract, left eye: Secondary | ICD-10-CM | POA: Diagnosis not present

## 2017-09-17 ENCOUNTER — Ambulatory Visit: Payer: Medicare HMO

## 2017-09-19 ENCOUNTER — Ambulatory Visit: Payer: Medicare HMO

## 2017-09-21 ENCOUNTER — Emergency Department: Payer: Medicare HMO

## 2017-09-21 ENCOUNTER — Ambulatory Visit (INDEPENDENT_AMBULATORY_CARE_PROVIDER_SITE_OTHER): Payer: Medicare HMO | Admitting: Physician Assistant

## 2017-09-21 ENCOUNTER — Ambulatory Visit: Payer: Medicare HMO

## 2017-09-21 ENCOUNTER — Inpatient Hospital Stay
Admission: EM | Admit: 2017-09-21 | Discharge: 2017-09-23 | DRG: 190 | Disposition: A | Discharge status: Home / Self Care | Payer: Medicare HMO | Attending: Internal Medicine | Admitting: Internal Medicine

## 2017-09-21 ENCOUNTER — Encounter: Payer: Self-pay | Admitting: Emergency Medicine

## 2017-09-21 ENCOUNTER — Other Ambulatory Visit: Payer: Self-pay

## 2017-09-21 VITALS — BP 132/70 | HR 108 | Temp 97.8°F | Ht 69.0 in | Wt 117.0 lb

## 2017-09-21 VITALS — BP 132/70 | HR 108 | Temp 97.8°F | Resp 15 | Ht 69.0 in | Wt 117.4 lb

## 2017-09-21 DIAGNOSIS — R0902 Hypoxemia: Secondary | ICD-10-CM | POA: Diagnosis not present

## 2017-09-21 DIAGNOSIS — Z7951 Long term (current) use of inhaled steroids: Secondary | ICD-10-CM

## 2017-09-21 DIAGNOSIS — J9622 Acute and chronic respiratory failure with hypercapnia: Secondary | ICD-10-CM | POA: Diagnosis present

## 2017-09-21 DIAGNOSIS — D696 Thrombocytopenia, unspecified: Secondary | ICD-10-CM | POA: Diagnosis present

## 2017-09-21 DIAGNOSIS — I10 Essential (primary) hypertension: Secondary | ICD-10-CM | POA: Diagnosis present

## 2017-09-21 DIAGNOSIS — N400 Enlarged prostate without lower urinary tract symptoms: Secondary | ICD-10-CM | POA: Diagnosis present

## 2017-09-21 DIAGNOSIS — Z0000 Encounter for general adult medical examination without abnormal findings: Secondary | ICD-10-CM

## 2017-09-21 DIAGNOSIS — R0602 Shortness of breath: Secondary | ICD-10-CM | POA: Diagnosis not present

## 2017-09-21 DIAGNOSIS — R000 Tachycardia, unspecified: Secondary | ICD-10-CM | POA: Diagnosis not present

## 2017-09-21 DIAGNOSIS — Z7982 Long term (current) use of aspirin: Secondary | ICD-10-CM | POA: Diagnosis not present

## 2017-09-21 DIAGNOSIS — E440 Moderate protein-calorie malnutrition: Secondary | ICD-10-CM | POA: Diagnosis present

## 2017-09-21 DIAGNOSIS — J9621 Acute and chronic respiratory failure with hypoxia: Secondary | ICD-10-CM | POA: Diagnosis present

## 2017-09-21 DIAGNOSIS — Z9981 Dependence on supplemental oxygen: Secondary | ICD-10-CM

## 2017-09-21 DIAGNOSIS — Z716 Tobacco abuse counseling: Secondary | ICD-10-CM | POA: Diagnosis not present

## 2017-09-21 DIAGNOSIS — L8990 Pressure ulcer of unspecified site, unspecified stage: Secondary | ICD-10-CM

## 2017-09-21 DIAGNOSIS — J181 Lobar pneumonia, unspecified organism: Secondary | ICD-10-CM | POA: Diagnosis present

## 2017-09-21 DIAGNOSIS — Z9842 Cataract extraction status, left eye: Secondary | ICD-10-CM | POA: Diagnosis not present

## 2017-09-21 DIAGNOSIS — D751 Secondary polycythemia: Secondary | ICD-10-CM | POA: Diagnosis present

## 2017-09-21 DIAGNOSIS — F17210 Nicotine dependence, cigarettes, uncomplicated: Secondary | ICD-10-CM | POA: Diagnosis present

## 2017-09-21 DIAGNOSIS — L89212 Pressure ulcer of right hip, stage 2: Secondary | ICD-10-CM | POA: Diagnosis present

## 2017-09-21 DIAGNOSIS — J410 Simple chronic bronchitis: Secondary | ICD-10-CM

## 2017-09-21 DIAGNOSIS — J9601 Acute respiratory failure with hypoxia: Secondary | ICD-10-CM

## 2017-09-21 DIAGNOSIS — R531 Weakness: Secondary | ICD-10-CM | POA: Diagnosis not present

## 2017-09-21 DIAGNOSIS — I700 Atherosclerosis of aorta: Secondary | ICD-10-CM | POA: Diagnosis not present

## 2017-09-21 DIAGNOSIS — Z79899 Other long term (current) drug therapy: Secondary | ICD-10-CM

## 2017-09-21 DIAGNOSIS — I272 Pulmonary hypertension, unspecified: Secondary | ICD-10-CM | POA: Diagnosis present

## 2017-09-21 DIAGNOSIS — J440 Chronic obstructive pulmonary disease with acute lower respiratory infection: Secondary | ICD-10-CM | POA: Diagnosis present

## 2017-09-21 DIAGNOSIS — A419 Sepsis, unspecified organism: Secondary | ICD-10-CM | POA: Diagnosis not present

## 2017-09-21 DIAGNOSIS — J441 Chronic obstructive pulmonary disease with (acute) exacerbation: Secondary | ICD-10-CM | POA: Diagnosis not present

## 2017-09-21 DIAGNOSIS — E785 Hyperlipidemia, unspecified: Secondary | ICD-10-CM | POA: Diagnosis present

## 2017-09-21 DIAGNOSIS — E872 Acidosis: Secondary | ICD-10-CM | POA: Diagnosis not present

## 2017-09-21 DIAGNOSIS — Z720 Tobacco use: Secondary | ICD-10-CM | POA: Diagnosis not present

## 2017-09-21 DIAGNOSIS — J189 Pneumonia, unspecified organism: Secondary | ICD-10-CM | POA: Diagnosis not present

## 2017-09-21 DIAGNOSIS — Z681 Body mass index (BMI) 19 or less, adult: Secondary | ICD-10-CM

## 2017-09-21 DIAGNOSIS — E46 Unspecified protein-calorie malnutrition: Secondary | ICD-10-CM

## 2017-09-21 LAB — BLOOD GAS, ARTERIAL
Acid-Base Excess: UNDETERMINED mmol/L (ref 0.0–2.0)
Bicarbonate: UNDETERMINED mmol/L (ref 20.0–28.0)
DELIVERY SYSTEMS: POSITIVE
Expiratory PAP: 6
FIO2: 0.45
INSPIRATORY PAP: 20
LHR: 16 {breaths}/min
O2 Saturation: UNDETERMINED %
Patient temperature: 37
pCO2 arterial: 120 mmHg (ref 32.0–48.0)
pH, Arterial: 7.24 — ABNORMAL LOW (ref 7.350–7.450)
pO2, Arterial: 46 mmHg — ABNORMAL LOW (ref 83.0–108.0)

## 2017-09-21 LAB — CBC WITH DIFFERENTIAL/PLATELET
Basophils Absolute: 0 10*3/uL (ref 0–0.1)
Basophils Relative: 0 %
Eosinophils Absolute: 0 10*3/uL (ref 0–0.7)
Eosinophils Relative: 1 %
HEMATOCRIT: 42.4 % (ref 40.0–52.0)
Hemoglobin: 14.2 g/dL (ref 13.0–18.0)
LYMPHS ABS: 0.7 10*3/uL — AB (ref 1.0–3.6)
LYMPHS PCT: 12 %
MCH: 33.8 pg (ref 26.0–34.0)
MCHC: 33.6 g/dL (ref 32.0–36.0)
MCV: 100.8 fL — AB (ref 80.0–100.0)
MONOS PCT: 17 %
Monocytes Absolute: 1 10*3/uL (ref 0.2–1.0)
NEUTROS ABS: 4.2 10*3/uL (ref 1.4–6.5)
NEUTROS PCT: 70 %
Platelets: 88 10*3/uL — ABNORMAL LOW (ref 150–440)
RBC: 4.2 MIL/uL — AB (ref 4.40–5.90)
RDW: 13.8 % (ref 11.5–14.5)
WBC: 6 10*3/uL (ref 3.8–10.6)

## 2017-09-21 LAB — COMPREHENSIVE METABOLIC PANEL
ALK PHOS: 53 U/L (ref 38–126)
ALT: 18 U/L (ref 17–63)
ANION GAP: 12 (ref 5–15)
AST: 22 U/L (ref 15–41)
Albumin: 4 g/dL (ref 3.5–5.0)
BILIRUBIN TOTAL: 0.9 mg/dL (ref 0.3–1.2)
BUN: 18 mg/dL (ref 6–20)
CALCIUM: 9.8 mg/dL (ref 8.9–10.3)
CO2: 41 mmol/L — ABNORMAL HIGH (ref 22–32)
CREATININE: 0.52 mg/dL — AB (ref 0.61–1.24)
Chloride: 82 mmol/L — ABNORMAL LOW (ref 101–111)
GFR calc non Af Amer: >60 mL/min (ref >60)
Glucose, Bld: 135 mg/dL — ABNORMAL HIGH (ref 65–99)
Potassium: 4.7 mmol/L (ref 3.5–5.1)
Sodium: 135 mmol/L (ref 135–145)
TOTAL PROTEIN: 7.9 g/dL (ref 6.5–8.1)

## 2017-09-21 LAB — TSH: TSH: 0.389 u[IU]/mL (ref 0.350–4.500)

## 2017-09-21 LAB — TROPONIN I: Troponin I: <0.03 ng/mL (ref <0.03)

## 2017-09-21 LAB — BLOOD GAS, VENOUS
Acid-Base Excess: UNDETERMINED mmol/L (ref 0.0–2.0)
Bicarbonate: UNDETERMINED mmol/L (ref 20.0–28.0)
O2 Saturation: UNDETERMINED %
PATIENT TEMPERATURE: 37
PCO2 VEN: 120 mmHg — AB (ref 44.0–60.0)
PH VEN: 7.18 — AB (ref 7.250–7.430)

## 2017-09-21 LAB — PHOSPHORUS: Phosphorus: 3 mg/dL (ref 2.5–4.6)

## 2017-09-21 LAB — COOXEMETRY PANEL
CARBOXYHEMOGLOBIN: 3.6 % — AB (ref 0.5–1.5)
Methemoglobin: 1.6 % — ABNORMAL HIGH (ref 0.0–1.5)
O2 SAT: 28.7 %

## 2017-09-21 LAB — PROCALCITONIN: PROCALCITONIN: 0.1 ng/mL

## 2017-09-21 LAB — MAGNESIUM: Magnesium: 2.1 mg/dL (ref 1.7–2.4)

## 2017-09-21 LAB — GLUCOSE, CAPILLARY: Glucose-Capillary: 136 mg/dL — ABNORMAL HIGH (ref 65–99)

## 2017-09-21 LAB — MRSA PCR SCREENING: MRSA by PCR: NEGATIVE

## 2017-09-21 MED ORDER — IPRATROPIUM-ALBUTEROL 0.5-2.5 (3) MG/3ML IN SOLN
3.0000 mL | Freq: Once | RESPIRATORY_TRACT | Status: AC
  Administered 2017-09-21: 3 mL via RESPIRATORY_TRACT
  Filled 2017-09-21: qty 3

## 2017-09-21 MED ORDER — ENOXAPARIN SODIUM 40 MG/0.4ML ~~LOC~~ SOLN
40.0000 mg | SUBCUTANEOUS | Status: DC
  Administered 2017-09-21 – 2017-09-22 (×2): 40 mg via SUBCUTANEOUS
  Filled 2017-09-21 (×2): qty 0.4

## 2017-09-21 MED ORDER — SODIUM CHLORIDE 0.9 % IV SOLN
2.0000 g | Freq: Once | INTRAVENOUS | Status: AC
  Administered 2017-09-21: 2 g via INTRAVENOUS
  Filled 2017-09-21: qty 2

## 2017-09-21 MED ORDER — MOMETASONE FURO-FORMOTEROL FUM 200-5 MCG/ACT IN AERO
2.0000 | INHALATION_SPRAY | Freq: Two times a day (BID) | RESPIRATORY_TRACT | Status: DC
  Administered 2017-09-22 – 2017-09-23 (×3): 2 via RESPIRATORY_TRACT
  Filled 2017-09-21: qty 8.8

## 2017-09-21 MED ORDER — METHYLPREDNISOLONE SODIUM SUCC 125 MG IJ SOLR
125.0000 mg | Freq: Once | INTRAMUSCULAR | Status: AC
  Administered 2017-09-21: 125 mg via INTRAVENOUS
  Filled 2017-09-21: qty 2

## 2017-09-21 MED ORDER — ALBUTEROL SULFATE (2.5 MG/3ML) 0.083% IN NEBU
2.5000 mg | INHALATION_SOLUTION | Freq: Once | RESPIRATORY_TRACT | Status: AC
  Administered 2017-09-21: 2.5 mg via RESPIRATORY_TRACT
  Filled 2017-09-21: qty 3

## 2017-09-21 MED ORDER — ASPIRIN EC 81 MG PO TBEC
81.0000 mg | DELAYED_RELEASE_TABLET | Freq: Every day | ORAL | Status: DC
  Administered 2017-09-22 – 2017-09-23 (×2): 81 mg via ORAL
  Filled 2017-09-21 (×2): qty 1

## 2017-09-21 MED ORDER — VANCOMYCIN HCL IN DEXTROSE 1-5 GM/200ML-% IV SOLN
1000.0000 mg | Freq: Once | INTRAVENOUS | Status: AC
  Administered 2017-09-21: 1000 mg via INTRAVENOUS
  Filled 2017-09-21: qty 200

## 2017-09-21 MED ORDER — IPRATROPIUM-ALBUTEROL 0.5-2.5 (3) MG/3ML IN SOLN
RESPIRATORY_TRACT | Status: AC
Start: 2017-09-21 — End: 2017-09-22
  Filled 2017-09-21: qty 3

## 2017-09-21 MED ORDER — IPRATROPIUM-ALBUTEROL 0.5-2.5 (3) MG/3ML IN SOLN
3.0000 mL | RESPIRATORY_TRACT | Status: DC
  Administered 2017-09-21 – 2017-09-22 (×4): 3 mL via RESPIRATORY_TRACT
  Filled 2017-09-21 (×3): qty 3

## 2017-09-21 MED ORDER — ACETAMINOPHEN 650 MG RE SUPP: 650.0000 mg | Freq: Four times a day (QID) | RECTAL | Status: DC | PRN

## 2017-09-21 MED ORDER — ATORVASTATIN CALCIUM 20 MG PO TABS
20.0000 mg | ORAL_TABLET | Freq: Every day | ORAL | Status: DC
  Administered 2017-09-22 – 2017-09-23 (×2): 20 mg via ORAL
  Filled 2017-09-21 (×2): qty 1

## 2017-09-21 MED ORDER — ONDANSETRON HCL 4 MG/2ML IJ SOLN: 4.0000 mg | Freq: Four times a day (QID) | INTRAMUSCULAR | Status: DC | PRN

## 2017-09-21 MED ORDER — ORAL CARE MOUTH RINSE
15.0000 mL | Freq: Two times a day (BID) | OROMUCOSAL | Status: DC
  Administered 2017-09-23: 15 mL via OROMUCOSAL

## 2017-09-21 MED ORDER — SODIUM CHLORIDE 0.9 % IV SOLN
1.0000 g | INTRAVENOUS | Status: DC
  Administered 2017-09-22: 1 g via INTRAVENOUS
  Filled 2017-09-21: qty 1
  Filled 2017-09-21: qty 10

## 2017-09-21 MED ORDER — SODIUM CHLORIDE 0.9 % IV SOLN
500.0000 mg | INTRAVENOUS | Status: DC
  Administered 2017-09-21 – 2017-09-22 (×2): 500 mg via INTRAVENOUS
  Filled 2017-09-21 (×3): qty 500

## 2017-09-21 MED ORDER — SODIUM CHLORIDE 0.9 % IV BOLUS
500.0000 mL | Freq: Once | INTRAVENOUS | Status: AC
  Administered 2017-09-21: 500 mL via INTRAVENOUS

## 2017-09-21 MED ORDER — ACETAMINOPHEN 325 MG PO TABS: 650.0000 mg | ORAL_TABLET | Freq: Four times a day (QID) | ORAL | Status: DC | PRN

## 2017-09-21 MED ORDER — ONDANSETRON HCL 4 MG PO TABS: 4.0000 mg | ORAL_TABLET | Freq: Four times a day (QID) | ORAL | Status: DC | PRN

## 2017-09-21 MED ORDER — METHYLPREDNISOLONE SODIUM SUCC 125 MG IJ SOLR
60.0000 mg | Freq: Three times a day (TID) | INTRAMUSCULAR | Status: DC
  Administered 2017-09-22 – 2017-09-23 (×4): 60 mg via INTRAVENOUS
  Filled 2017-09-21 (×4): qty 2

## 2017-09-21 MED ORDER — SODIUM CHLORIDE 0.9 % IV SOLN
Freq: Once | INTRAVENOUS | Status: AC
  Administered 2017-09-21: 21:00:00 via INTRAVENOUS

## 2017-09-21 MED ORDER — MAGNESIUM SULFATE 2 GM/50ML IV SOLN
2.0000 g | Freq: Once | INTRAVENOUS | Status: AC
  Administered 2017-09-21: 2 g via INTRAVENOUS
  Filled 2017-09-21: qty 50

## 2017-09-21 MED ORDER — CHLORHEXIDINE GLUCONATE 0.12 % MT SOLN
15.0000 mL | Freq: Two times a day (BID) | OROMUCOSAL | Status: DC
  Administered 2017-09-22 – 2017-09-23 (×4): 15 mL via OROMUCOSAL
  Filled 2017-09-21 (×4): qty 15

## 2017-09-21 NOTE — ED Triage Notes (Signed)
Pt to ED via POV c/o weakness and shortness of breath. Pt states that he was recently treated for pneumonia. Pt went to PCP office and SpO2 sats were in the 70's on O2. Upon arrival to ED pt is pale in appearance, pt has diminished lung sounds in all fields and SpO2 sat in at 76% on 4 liter of O2.

## 2017-09-21 NOTE — Progress Notes (Signed)
Increased FIO2 to 45% due to saturations of 84%

## 2017-09-21 NOTE — ED Notes (Signed)
Pt placed on BIPAP

## 2017-09-21 NOTE — ED Notes (Signed)
Respiratory called at this time to come check pt's mask and O2 currently reading 73-74%

## 2017-09-21 NOTE — Progress Notes (Signed)
Subjective:    Patient ID: Patrick Ayala, male    DOB: Jun 29, 1940, 77 y.o.   MRN: [id number]  AHMED INNISS is a 77 y.o. male presenting on 09/21/2017 for No chief complaint on file.   HPI   KATHY WARES is a 77 y/o man with history of COPD, current tobacco abuse, 2.5L O2 via nasal cannula continuous, HTN presenting today for AWV. However, his vitals revealed tachycardia pulse 109 bpm and pulse ox between 78%-80%. Patient reports weakness on his right side x 2 weeks. He reports feeling warm. His wife who presents with him reports that this is not his baseline and he seems very lethargic. He is denying chest pain, SOB. Wife reports that if he is left alone, he will just fall asleep suddenly. Baseline )2 sats are typically in the 90's.  Vitals:   09/21/17 1624  BP: 132/70  Pulse: (!) 108  Temp: 97.8 F (36.6 C)  SpO2: (!) 78%     Social History   Tobacco Use  . Smoking status: Current Every Day Smoker    Packs/day: 0.50    Types: Cigarettes  . Smokeless tobacco: Never Used  . Tobacco comment: Smoking History 1(1)Packs per day  Substance Use Topics  . Alcohol use: Yes    Alcohol/week: 8.4 oz    Types: 14 Cans of beer per week    Comment: 2 cans of beer a night  . Drug use: No    Review of Systems Per HPI unless specifically indicated above     Objective:    BP 132/70   Pulse (!) 108   Temp 97.8 F (36.6 C)   Ht _0  (1.753 m)   Wt 117 lb (53.1 kg)   SpO2 (!) 78%   BMI 17.28 kg/m   Wt Readings from Last 3 Encounters:  09/21/17 117 lb 6.4 oz (53.3 kg)  09/21/17 117 lb (53.1 kg)  09/06/17 120 lb 1.6 oz (54.5 kg)    Physical Exam  Constitutional: He is oriented to person, place, and time. He appears well-developed and well-nourished. He appears ill. No distress.  Cardiovascular: Regular rhythm and normal heart sounds. Tachycardia present.  Pulmonary/Chest: No respiratory distress. He has no wheezes. He has no rales.  There is very poor air entry into lungs  bilaterally.   Neurological: He is alert and oriented to person, place, and time.  Skin: Skin is warm and dry.  Psychiatric: His behavior is normal. He exhibits a depressed mood.   Results for orders placed or performed in visit on 09/06/17  Ferritin  Result Value Ref Range   Ferritin 173 24 - 336 ng/mL  Comprehensive metabolic panel  Result Value Ref Range   Sodium 137 135 - 145 mmol/L   Potassium 4.5 3.5 - 5.1 mmol/L   Chloride 89 (L) 101 - 111 mmol/L   CO2 38 (H) 22 - 32 mmol/L   Glucose, Bld 100 (H) 65 - 99 mg/dL   BUN 12 6 - 20 mg/dL   Creatinine, Ser 0.63 0.61 - 1.24 mg/dL   Calcium 9.7 8.9 - 10.3 mg/dL   Total Protein 7.4 6.5 - 8.1 g/dL   Albumin 4.5 3.5 - 5.0 g/dL   AST 29 15 - 41 U/L   ALT 20 17 - 63 U/L   Alkaline Phosphatase 48 38 - 126 U/L   Total Bilirubin 1.2 0.3 - 1.2 mg/dL   GFR calc non Af Amer >60 >60 mL/min   GFR calc Af Amer >  60 >60 mL/min   Anion gap 10 5 - 15  CBC with Differential  Result Value Ref Range   WBC 7.3 3.8 - 10.6 K/uL   RBC 4.00 (L) 4.40 - 5.90 MIL/uL   Hemoglobin 13.9 13.0 - 18.0 g/dL   HCT 40.4 40.0 - 52.0 %   MCV 101.0 (H) 80.0 - 100.0 fL   MCH 34.6 (H) 26.0 - 34.0 pg   MCHC 34.3 32.0 - 36.0 g/dL   RDW 13.6 11.5 - 14.5 %   Platelets 86 (L) 150 - 440 K/uL   Neutrophils Relative % 71 %   Neutro Abs 5.2 1.4 - 6.5 K/uL   Lymphocytes Relative 20 %   Lymphs Abs 1.4 1.0 - 3.6 K/uL   Monocytes Relative 8 %   Monocytes Absolute 0.6 0.2 - 1.0 K/uL   Eosinophils Relative 1 %   Eosinophils Absolute 0.1 0 - 0.7 K/uL   Basophils Relative 0 %   Basophils Absolute 0.0 0 - 0.1 K/uL  Folate  Result Value Ref Range   Folate 34.0 >5.9 ng/mL      Assessment & Plan:   1. Simple chronic bronchitis ([place])  Patient is ill appearing, acutely hypoxic, poor air entry,  and experiencing tachycardia. His low O2 sats are highly concerning for any number of acute processes not limited to COPD exacerbation or infection. Have directed patient and family to  [facility] ER and I will call ahead.  2. Hypoxia   3. Tachycardia  I have spent 15 minutes with this patient, >50% of which was spent on counseling and coordination of care.    Follow up plan: Return if symptoms worsen or fail to improve.  Carles Collet, PA-C [facility] 09/21/2017, 4:29 PM

## 2017-09-21 NOTE — Patient Instructions (Signed)

## 2017-09-21 NOTE — ED Notes (Signed)
Called Respiratory for transport

## 2017-09-21 NOTE — Progress Notes (Signed)
   [facility] Day: 0 days Patrick Ayala is a 77 y.o. male presenting with Shortness of Breath and Weakness .   Advance care planning discussed with patient  And his wife at bedside. All questions in regards to overall condition and expected prognosis answered. Patient mentioned that he would need some more time to think about it and make a decision.  For now we will continue his full CODE STATUS.   CODE STATUS: Full code  time spent: 18 minutes

## 2017-09-21 NOTE — Progress Notes (Signed)
Subjective:   Patrick Ayala is a 77 y.o. male who presents for Medicare Annual/Subsequent preventive examination.  Nurse Notes:Patient presented for annual wellness visit today,  patients oxygen 78% with HR of 108 with complaints of feeling very weak in the last few days, Carles Collet into see patient for evaluation. Unable to complete wellness visit today as patient sent to ER, will reschedule AWV for another day.      Objective:    Vitals: BP 132/70 (BP Location: Left Arm, Patient Position: Sitting)   Pulse (!) 108   Temp 97.8 F (36.6 C) (Temporal)   Resp 15   Ht 5' 9" (1.753 m)   Wt 117 lb 6.4 oz (53.3 kg)   SpO2 (!) 78%   BMI 17.34 kg/m   Body mass index is 17.34 kg/m.

## 2017-09-21 NOTE — ED Provider Notes (Signed)
[facility] Emergency Department Provider Note    First MD Initiated Contact with Patient 09/21/17 1651     (approximate)  I have reviewed the triage vital signs and the nursing notes.   HISTORY  Chief Complaint Shortness of Breath and Weakness    HPI Patrick Ayala is a 77 y.o. male history of COPD on 2 L nasal cannula at home presents the ER with worsening shortness of breath of the past several days.  Says he was recently treated for pneumonia and went for follow-up with PCP found to have O2 sats in the low 70s.  Patient states that he has not had any chest pain or lower extremity swelling but does feel very fatigued and weak.  States he is unable to walk due to significant fatigue and weakness.  Denies any fevers or chills.    Past Medical History:  Diagnosis Date  . Carboxyhemoglobinemia   . COPD (chronic obstructive pulmonary disease) ([place])   . Polycythemia, secondary 10/20/2014  . Thrombocytopenia ([place])    Family History  Problem Relation Age of Onset  . Cancer Mother   . Cancer Sister   . Cancer Brother    Past Surgical History:  Procedure Laterality Date  . APPENDECTOMY    . EYE SURGERY Right    cataract  . HERNIA REPAIR    . polycythemia     Patient Active Problem List   Diagnosis Date Noted  . Multiple pulmonary nodules 10/16/2016  . Advanced care planning/counseling discussion 09/19/2016  . Aortic atherosclerosis ([place]) 07/10/2016  . Pulmonary hypertension, primary ([place]) 07/10/2016  . BPH (benign prostatic hyperplasia) 07/19/2015  . Cataracts, bilateral 06/28/2015  . Essential hypertension 02/01/2015  . Hyperlipidemia 02/01/2015  . COPD (chronic obstructive pulmonary disease) ([place]) 02/01/2015  . B12 deficiency 01/27/2015  . Personal history of tobacco use, presenting hazards to health 12/21/2014  . Weight loss 12/07/2014  . Thrombocytopenia ([place]) 11/02/2014  . Polycythemia, secondary 10/20/2014      Prior to Admission  medications   Medication Sig Start Date End Date Taking? Authorizing Provider  albuterol (PROVENTIL HFA;VENTOLIN HFA) 108 (90 Base) MCG/ACT inhaler Inhale 1-2 puffs into the lungs every 6 (six) hours as needed for wheezing or shortness of breath. 09/19/16   Guadalupe Maple, MD  amLODipine (NORVASC) 5 MG tablet Take 1 tablet (5 mg total) by mouth daily. 03/27/17   Guadalupe Maple, MD  aspirin 81 MG tablet ASA 81 mg po once a day (start taking it from 08/02/2013)   Quantity: 0;  Refills: 0  Active    [provider]  atorvastatin (LIPITOR) 20 MG tablet Take 1 tablet (20 mg total) daily by mouth. 02/16/17   Guadalupe Maple, MD  benazepril (LOTENSIN) 40 MG tablet Take 1 tablet (40 mg total) by mouth daily. 03/28/17   Guadalupe Maple, MD  budesonide-formoterol (SYMBICORT) 160-4.5 MCG/ACT inhaler Inhale 2 puffs into the lungs every morning. INHALE 2 TIMES DAILY 09/19/16   Guadalupe Maple, MD  SYMBICORT 160-4.5 MCG/ACT inhaler INHALE 2 PUFFS INTO THE LUNGS EVERY MORNING. INHALE 2 TIMES DAILY 01/12/17   Volney American, PA-C    Allergies No known allergies    Social History Social History   Tobacco Use  . Smoking status: Current Every Day Smoker    Packs/day: 0.50    Types: Cigarettes  . Smokeless tobacco: Never Used  . Tobacco comment: Smoking History 1(1)Packs per day  Substance Use Topics  . Alcohol use: Yes  Alcohol/week: 8.4 oz    Types: 14 Cans of beer per week    Comment: 2 cans of beer a night  . Drug use: No    Review of Systems Patient denies headaches, rhinorrhea, blurry vision, numbness, shortness of breath, chest pain, edema, cough, abdominal pain, nausea, vomiting, diarrhea, dysuria, fevers, rashes or hallucinations unless otherwise stated above in HPI. ____________________________________________   PHYSICAL EXAM:  VITAL SIGNS: Vitals:   09/21/17 1647  SpO2: (!) 27%    Constitutional: Alert and oriented.  Eyes: Conjunctivae are normal.  Head:  Atraumatic. Nose: No congestion/rhinnorhea. Mouth/Throat: Mucous membranes are moist.   Neck: No stridor. Painless ROM.  Cardiovascular: Normal rate, regular rhythm. Grossly normal heart sounds.  Good peripheral circulation. Respiratory: tachypnea with acute repiratory distress with minimal airmovement in all lung fields.  + use of accessory muscles Gastrointestinal: Soft and nontender. No distention. No abdominal bruits. No CVA tenderness. Genitourinary: deferred Musculoskeletal: No lower extremity tenderness nor edema.  No joint effusions. Neurologic:  Normal speech and language. No gross focal neurologic deficits are appreciated. No facial droop Skin:  Skin is warm, dry and intact. No rash noted. Psychiatric: Mood and affect are normal. Speech and behavior are normal.  ____________________________________________   LABS (all labs ordered are listed, but only abnormal results are displayed)  Results for orders placed or performed during the hospital encounter of 09/21/17 (from the past 24 hour(s))  CBC with Differential/Platelet     Status: Abnormal   Collection Time: 09/21/17  5:00 PM  Result Value Ref Range   WBC 6.0 3.8 - 10.6 K/uL   RBC 4.20 (L) 4.40 - 5.90 MIL/uL   Hemoglobin 14.2 13.0 - 18.0 g/dL   HCT 42.4 40.0 - 52.0 %   MCV 100.8 (H) 80.0 - 100.0 fL   MCH 33.8 26.0 - 34.0 pg   MCHC 33.6 32.0 - 36.0 g/dL   RDW 13.8 11.5 - 14.5 %   Platelets 88 (L) 150 - 440 K/uL   Neutrophils Relative % 70 %   Neutro Abs 4.2 1.4 - 6.5 K/uL   Lymphocytes Relative 12 %   Lymphs Abs 0.7 (L) 1.0 - 3.6 K/uL   Monocytes Relative 17 %   Monocytes Absolute 1.0 0.2 - 1.0 K/uL   Eosinophils Relative 1 %   Eosinophils Absolute 0.0 0 - 0.7 K/uL   Basophils Relative 0 %   Basophils Absolute 0.0 0 - 0.1 K/uL  Comprehensive metabolic panel     Status: Abnormal   Collection Time: 09/21/17  5:00 PM  Result Value Ref Range   Sodium 135 135 - 145 mmol/L   Potassium 4.7 3.5 - 5.1 mmol/L   Chloride  82 (L) 101 - 111 mmol/L   CO2 41 (H) 22 - 32 mmol/L   Glucose, Bld 135 (H) 65 - 99 mg/dL   BUN 18 6 - 20 mg/dL   Creatinine, Ser 0.52 (L) 0.61 - 1.24 mg/dL   Calcium 9.8 8.9 - 10.3 mg/dL   Total Protein 7.9 6.5 - 8.1 g/dL   Albumin 4.0 3.5 - 5.0 g/dL   AST 22 15 - 41 U/L   ALT 18 17 - 63 U/L   Alkaline Phosphatase 53 38 - 126 U/L   Total Bilirubin 0.9 0.3 - 1.2 mg/dL   GFR calc non Af Amer >60 >60 mL/min   GFR calc Af Amer >60 >60 mL/min   Anion gap 12 5 - 15  Troponin I     Status: None  Collection Time: 09/21/17  5:00 PM  Result Value Ref Range   Troponin I <0.03 <0.03 ng/mL  Blood gas, venous     Status: Abnormal   Collection Time: 09/21/17  5:00 PM  Result Value Ref Range   pH, Ven 7.18 (LL) 7.250 - 7.430   pCO2, Ven 120 (HH) 44.0 - 60.0 mmHg   Bicarbonate UNABLE TO CALCULATE. 20.0 - 28.0 mmol/L   Acid-Base Excess UNABLE TO CALCULATE. 0.0 - 2.0 mmol/L   O2 Saturation UNABLE TO CALCULATE. %   Patient temperature 37.0    Collection site VEIN    Sample type VENIPUNCTURE   .Cooxemetry Panel (carboxy, met, total hgb, O2 sat)     Status: Abnormal   Collection Time: 09/21/17  5:11 PM  Result Value Ref Range   O2 Saturation 28.7 %   Carboxyhemoglobin 3.6 (H) 0.5 - 1.5 %   Methemoglobin 1.6 (H) 0.0 - 1.5 %   ____________________________________________  EKG My review and personal interpretation at Time: 17:05   Indication: sob  Rate: 90  Rhythm: sinus Axis: normal Other: normal intervals, no stemi ____________________________________________  RADIOLOGY  I personally reviewed all radiographic images ordered to evaluate for the above acute complaints and reviewed radiology reports and findings.  These findings were personally discussed with the patient.  Please see medical record for radiology report.  ____________________________________________   PROCEDURES  Procedure(s) performed:  .Critical Care Performed by: Merlyn Lot, MD Authorized by: Merlyn Lot, MD   Critical care provider statement:    Critical care time (minutes):  40   Critical care time was exclusive of:  Separately billable procedures and treating other patients   Critical care was necessary to treat or prevent imminent or life-threatening deterioration of the following conditions:  Respiratory failure   Critical care was time spent personally by me on the following activities:  Development of treatment plan with patient or surrogate, discussions with consultants, evaluation of patient's response to treatment, examination of patient, obtaining history from patient or surrogate, ordering and performing treatments and interventions, ordering and review of laboratory studies, ordering and review of radiographic studies, pulse oximetry, re-evaluation of patient's condition and review of old charts      Critical Care performed: yes  ____________________________________________   INITIAL IMPRESSION / [place] / ED COURSE  Pertinent labs & imaging results that were available during my care of the patient were reviewed by me and considered in my medical decision making (see chart for details).   DDX: Asthma, copd, CHF, pna, ptx, malignancy, Pe, anemia   TYMEIR WEATHINGTON is a 77 y.o. who presents to the ED with acute respiratory distress as described above.  Patient denies any chest pain but does have profound hypoxia.  Patient placed on BiPAP for respiratory support.  Blood work was sent for the above differential.  Denies any chest pain at this point and is compensating fairly well remarkably.  Clinical Course as of Sep 21 1905  Fri Sep 21, 2017  1718 Currently receiving nebulizer treatments.Patient improving on BiPAP.   [PR]  5956 Prefer to be made comfortable if his condition were to worsen.  on BiPAP.  I discussed my concern for his profoundly elevated PCO2 with patient and possible need for intubation with patient and family states he would not want to be  placed on life supportPatient pulling tidal volumes over 2508 100   [PR]  1812 Chest x-ray does suggest an opacity concerning for pneumonia therefore she was recently antibiotics and  given his hypoxia and respiratory distress will treat for healthcare associated pneumonia.  Otherwise does not meeting criteria for sepsis.  We will continue with medical management.  Patient will require hospitalization given the acuity of his condition.   [PR]  1822 VBG somewhat improving.  Patient still mentating well.  Appears comfortable on bipap.  Have discussed with the patient and available family all diagnostics and treatments performed thus far and all questions were answered to the best of my ability. The patient demonstrates understanding and agreement with plan.    [PR]    Clinical Course User Index [PR] Merlyn Lot, MD     As part of my medical decision making, I reviewed the following data within the [place] notes reviewed and incorporated, Labs reviewed, notes from prior ED visits and [place] Controlled Substance Database   ____________________________________________   FINAL CLINICAL IMPRESSION(S) / ED DIAGNOSES  Final diagnoses:  Acute respiratory failure with hypoxia ([place])  COPD exacerbation ([place])      NEW MEDICATIONS STARTED DURING THIS VISIT:  New Prescriptions   No medications on file     Note:  This document was prepared using Dragon voice recognition software and may include unintentional dictation errors.    Merlyn Lot, MD 09/21/17 [place]

## 2017-09-21 NOTE — H&P (Signed)
[facility] [place] NAME: Patrick Ayala    MR#:  [id number]  DATE OF BIRTH:  September 25, 1940  DATE OF ADMISSION:  09/21/2017  PRIMARY CARE PHYSICIAN: Guadalupe Maple, MD   REQUESTING/REFERRING PHYSICIAN: Dr. Merlyn Lot  CHIEF COMPLAINT:   Chief Complaint  Patient presents with  . Shortness of Breath  . Weakness    HISTORY OF PRESENT ILLNESS:  Patrick Ayala  is a 77 y.o. male with a known history of chronic respiratory failure secondary to COPD on 2.5 L home oxygen, polycythemia with erythrocytosis and thrombocytopenia presents to hospital secondary to worsening shortness of breath going on for almost 3 to 4 days now. Patient is active and independent at baseline.  Is been having worsening shortness of breath with dry cough in the last 3 to 4 days.  He went to see his PCP, was noted to have oxygen saturations in the 70s and so sent to the emergency room.  Complains of weakness and fatigue.  No fevers or chills no nausea or vomiting.  Chest x-ray showing right lower lobe infiltrate consistent with pneumonia.  Also has COPD exacerbation with scant breath sounds.  ABG showing respiratory acidosis with CO2 of 120, placed on BiPAP at this time.  Still alert and oriented.  Will be admitted to stepdown for respiratory failure.  PAST MEDICAL HISTORY:   Past Medical History:  Diagnosis Date  . Carboxyhemoglobinemia   . COPD (chronic obstructive pulmonary disease) ([facility])    on 2.5L chronic home o2  . Polycythemia, secondary 10/20/2014  . Thrombocytopenia ([place])     PAST SURGICAL HISTORY:   Past Surgical History:  Procedure Laterality Date  . APPENDECTOMY    . EYE SURGERY Right    cataract  . HERNIA REPAIR    . polycythemia      SOCIAL HISTORY:   Social History   Tobacco Use  . Smoking status: Current Every Day Smoker    Packs/day: 0.50    Types: Cigarettes  . Smokeless tobacco: Never Used  . Tobacco comment: Smoking History  1(1)Packs per day  Substance Use Topics  . Alcohol use: Yes    Alcohol/week: 8.4 oz    Types: 14 Cans of beer per week    Comment: 2 cans of beer a night    FAMILY HISTORY:   Family History  Problem Relation Age of Onset  . Cancer Mother   . Cancer Sister   . Cancer Brother     DRUG ALLERGIES:   Allergies  Allergen Reactions  . No Known Allergies     REVIEW OF SYSTEMS:   Review of Systems  Constitutional: Positive for malaise/fatigue. Negative for chills, fever and weight loss.  HENT: Negative for ear discharge, ear pain, hearing loss and nosebleeds.   Eyes: Negative for blurred vision, double vision and photophobia.  Respiratory: Positive for cough, shortness of breath and wheezing. Negative for hemoptysis.   Cardiovascular: Negative for chest pain, palpitations, orthopnea and leg swelling.  Gastrointestinal: Negative for abdominal pain, constipation, diarrhea, heartburn, melena, nausea and vomiting.  Genitourinary: Negative for dysuria, frequency, hematuria and urgency.  Musculoskeletal: Negative for back pain, myalgias and neck pain.  Skin: Negative for rash.  Neurological: Negative for dizziness, tingling, tremors, sensory change, speech change, focal weakness and headaches.  Endo/Heme/Allergies: Does not bruise/bleed easily.  Psychiatric/Behavioral: Negative for depression.    MEDICATIONS AT HOME:   Prior to Admission medications   Medication Sig Start Date End Date Taking?  Authorizing Provider  albuterol (PROVENTIL HFA;VENTOLIN HFA) 108 (90 Base) MCG/ACT inhaler Inhale 1-2 puffs into the lungs every 6 (six) hours as needed for wheezing or shortness of breath. 09/19/16   Guadalupe Maple, MD  amLODipine (NORVASC) 5 MG tablet Take 1 tablet (5 mg total) by mouth daily. 03/27/17   Guadalupe Maple, MD  aspirin 81 MG tablet ASA 81 mg po once a day (start taking it from 08/02/2013)   Quantity: 0;  Refills: 0  Active    [provider]  atorvastatin (LIPITOR) 20  MG tablet Take 1 tablet (20 mg total) daily by mouth. 02/16/17   Guadalupe Maple, MD  benazepril (LOTENSIN) 40 MG tablet Take 1 tablet (40 mg total) by mouth daily. 03/28/17   Guadalupe Maple, MD  budesonide-formoterol (SYMBICORT) 160-4.5 MCG/ACT inhaler Inhale 2 puffs into the lungs every morning. INHALE 2 TIMES DAILY 09/19/16   Guadalupe Maple, MD  SYMBICORT 160-4.5 MCG/ACT inhaler INHALE 2 PUFFS INTO THE LUNGS EVERY MORNING. INHALE 2 TIMES DAILY 01/12/17   Volney American, PA-C      VITAL SIGNS:  Blood pressure (!) 100/53, pulse 75, temperature 97.8 F (36.6 C), temperature source Oral, resp. rate 15, weight 53.1 kg (117 lb), SpO2 93 %.  PHYSICAL EXAMINATION:   Physical Exam  GENERAL:  77 y.o.-year-old thin built patient lying in the bed with no acute distress.  EYES: Pupils equal, round, reactive to light and accommodation. No scleral icterus. Extraocular muscles intact.  HEENT: Head atraumatic, normocephalic. Oropharynx and nasopharynx clear.  NECK:  Supple, no jugular venous distention. No thyroid enlargement, no tenderness.  LUNGS: scant breath sounds bilaterally, scattered  wheezing, no rales,rhonchi or crepitation.Using accessory muscles of respiration.  CARDIOVASCULAR: S1, S2 normal. No murmurs, rubs, or gallops.  ABDOMEN: Soft, nontender, nondistended. Bowel sounds present. No organomegaly or mass.  EXTREMITIES: No pedal edema, cyanosis, or clubbing.  NEUROLOGIC: Cranial nerves II through XII are intact. Muscle strength 5/5 in all extremities. Sensation intact. Gait not checked.  PSYCHIATRIC: The patient is alert and oriented x 3.  SKIN: No obvious rash, lesion, or ulcer.   LABORATORY PANEL:   CBC Recent Labs  Lab 09/21/17 1700  WBC 6.0  HGB 14.2  HCT 42.4  PLT 88*   ------------------------------------------------------------------------------------------------------------------  Chemistries  Recent Labs  Lab 09/21/17 1700  NA 135  K 4.7  CL 82*  CO2  41*  GLUCOSE 135*  BUN 18  CREATININE 0.52*  CALCIUM 9.8  AST 22  ALT 18  ALKPHOS 53  BILITOT 0.9   ------------------------------------------------------------------------------------------------------------------  Cardiac Enzymes Recent Labs  Lab 09/21/17 1700  TROPONINI <0.03   ------------------------------------------------------------------------------------------------------------------  RADIOLOGY:  Dg Chest Port 1 View  Result Date: 09/21/2017 CLINICAL DATA:  Weakness and shortness of breath onset today. Recently treated for pneumonia. EXAM: PORTABLE CHEST 1 VIEW COMPARISON:  Chest x-rays dated 08/02/2016 and 01/30/2016. FINDINGS: Ill-defined opacity at the medial aspects of the RIGHT lung base, likely pneumonia. Lungs are hyperexpanded, with associated emphysematous changes in the upper lobes. Heart size and mediastinal contours are stable. Aortic atherosclerosis. No acute or suspicious osseous finding. IMPRESSION: 1. New ill-defined opacity at the medial aspects of the RIGHT lung base, presumed pneumonia. 2. COPD/emphysema. 3. Aortic atherosclerosis. Electronically Signed   By: Franki Cabot M.D.   On: 09/21/2017 17:58    EKG:   Orders placed or performed during the hospital encounter of 09/21/17  . ED EKG  . ED EKG    IMPRESSION AND PLAN:  Patrick Ayala  is a 77 y.o. male with a known history of chronic respiratory failure secondary to COPD on 2.5 L home oxygen, polycythemia with erythrocytosis and thrombocytopenia presents to hospital secondary to worsening shortness of breath going on for almost 3 to 4 days now.  1.  Acute hypoxic hypercarbic respiratory failure-secondary to COPD exacerbation and also right lower lobe pneumonia -Admit to stepdown continue BiPAP, current FiO2 of 50% and wean as tolerated -Intensivist consult -Started Solu-Medrol, duo nebs and inhalers -Blood cultures have been drawn.  Started on Rocephin and azithromycin for community-acquired  pneumonia.  2.  Respiratory acidosis-continue BiPAP as mentioned above.  Pulmonary consult.  3.  Hypertension-currently low blood pressures-could be secondary to being on BiPAP.  Hold his home medications of benazepril and Norvasc  4.  Hyperlipidemia-continue statin  5.  Polycythemia-follows at the cancer center.  No recent phlebotomies.  6.  DVT prophylaxis-Lovenox    All the records are reviewed and case discussed with ED provider. Management plans discussed with the patient, family and they are in agreement.  CODE STATUS: Full Code  TOTAL CRITICAL CARE TIME SPENT IN TAKING CARE OF THIS PATIENT: 50 minutes.    Gladstone Lighter M.D on 09/21/2017 at 7:38 PM  Between 7am to 6pm - Pager - [phone]  After 6pm go to [email or web address] - password [email or web address] [place] [facility]  Office  [phone]  CC: Primary care physician; Guadalupe Maple, MD

## 2017-09-21 NOTE — Progress Notes (Signed)
Increased back up rate to 16

## 2017-09-21 NOTE — Progress Notes (Signed)
eLink Physician-Brief Progress Note Patient Name: Patrick Ayala DOB: Apr 17, 1940 MRN: [id number]   Date of Service  09/21/2017  HPI/Events of Note  COPD exacerbation hypercapnic resp failure Rt sided pneumonia  eICU Interventions  admit to SD/ICU BiPAP IV abx IV steriods NEB therapy      Intervention Category Evaluation Type: New Patient Evaluation  Flora Lipps 09/21/2017, 8:49 PM

## 2017-09-21 NOTE — Consult Note (Signed)
PULMONARY / CRITICAL CARE MEDICINE   Name: Patrick Ayala MRN: [id number] DOB: 11-21-1940    ADMISSION DATE:  09/21/2017   CONSULTATION DATE:  09/21/17  REFERRING MD: Dr. Tressia Miners  Reason: Acute hypoxic respiratory failure  HISTORY OF PRESENT ILLNESS:   77 year old African-American male, current smoker, history of COPD on home O2 2.5 L nasal cannula, who presented to the ED with complaints of worsening shortness of breath that started about 4 days ago and weakness that has persisted for 2 weeks.  Patient presented initially at his doctor's office for a wellness visit and was found to be hypoxic with SPO2 in the 70s was tachycardic with heart rate of 109.  He at that time he complained of weakness and lethargy.  Hence he was referred to the emergency room for further evaluation.  At the ED, his work-up showed right lower lobe pneumonia.  He has a previous history of pneumonia.  He has been started on broad-spectrum antibiotics and placed on BiPAP and is being admitted to the ICU for further management. Patient reports poor appetite.  His wife and family, patient has not been eating much.  His weight continues to decline.  He continues to smoke about half a pack of cigarettes a day.  He denies any pain, chest pain, palpitations, nausea, vomiting, dizziness and headache  PAST MEDICAL HISTORY :  He  has a past medical history of Carboxyhemoglobinemia, COPD (chronic obstructive pulmonary disease) ([place]), Polycythemia, secondary (10/20/2014), and Thrombocytopenia ([place]).  PAST SURGICAL HISTORY: He  has a past surgical history that includes polycythemia; Hernia repair; Appendectomy; and Eye surgery (Right).  Allergies  Allergen Reactions  . No Known Allergies     No current facility-administered medications on file prior to encounter.    Current Outpatient Medications on File Prior to Encounter  Medication Sig  . albuterol (PROVENTIL HFA;VENTOLIN HFA) 108 (90 Base) MCG/ACT inhaler Inhale 1-2  puffs into the lungs every 6 (six) hours as needed for wheezing or shortness of breath.  Marland Kitchen amLODipine (NORVASC) 5 MG tablet Take 1 tablet (5 mg total) by mouth daily.  Marland Kitchen aspirin 81 MG tablet ASA 81 mg po once a day (start taking it from 08/02/2013)   Quantity: 0;  Refills: 0  Active  . atorvastatin (LIPITOR) 20 MG tablet Take 1 tablet (20 mg total) daily by mouth.  . benazepril (LOTENSIN) 40 MG tablet Take 1 tablet (40 mg total) by mouth daily.  . budesonide-formoterol (SYMBICORT) 160-4.5 MCG/ACT inhaler Inhale 2 puffs into the lungs every morning. INHALE 2 TIMES DAILY  . SYMBICORT 160-4.5 MCG/ACT inhaler INHALE 2 PUFFS INTO THE LUNGS EVERY MORNING. INHALE 2 TIMES DAILY    FAMILY HISTORY:  His indicated that his mother is deceased. He indicated that his father is deceased. He indicated that the status of his sister is unknown. He indicated that the status of his brother is unknown.   SOCIAL HISTORY: He  reports that he has been smoking cigarettes.  He has been smoking about 0.50 packs per day. He has never used smokeless tobacco. He reports that he drinks about 8.4 oz of alcohol per week. He reports that he does not use drugs.  REVIEW OF SYSTEMS:   Constitutional: Negative for fever and chills.  HENT: Negative for congestion and rhinorrhea.  Eyes: Negative for redness and visual disturbance.  Respiratory: Positive for shortness of breath negative for wheezing.  Cardiovascular: Negative for chest pain and palpitations.  Gastrointestinal: Negative  for nausea , vomiting and abdominal pain  positive for loose stools mostly in the morning g Genitourinary: Negative for dysuria and urgency.  Endocrine: Denies polyuria, polyphagia and heat intolerance Musculoskeletal: Negative for myalgias and arthralgias.  Skin: Negative for pallor and wound.  Neurological: Negative for dizziness and headaches   SUBJECTIVE:   VITAL SIGNS: BP (!) 96/54   Pulse 72   Temp 97.8 F (36.6 C) (Oral)   Resp 19    Wt 117 lb (53.1 kg)   SpO2 91%   BMI 17.28 kg/m   HEMODYNAMICS:    VENTILATOR SETTINGS: FiO2 (%):  [40 %-50 %] 50 %  INTAKE / OUTPUT: No intake/output data recorded.  PHYSICAL EXAMINATION: General: Appears cachectic, in moderate respiratory distress Neuro: Alert and oriented x3, no focal deficits HEENT: PERRLA, no JVD, trachea midline Cardiovascular: Apical pulse regular, S1-S2, no murmur regurg or gallop, +2 pulses bilaterally, no edema Lungs: Bilateral breath sounds, diminished in the bases, positive Rales in right lower lobe, no wheezing Abdomen: Nondistended, normal bowel sounds in all 4 quadrants, palpation reveals no organomegaly Musculoskeletal: Good range of motion in upper and lower extremities, no joint deformities Skin: Warm and dry, poor turgor  LABS:  BMET Recent Labs  Lab 09/21/17 1700  NA 135  K 4.7  CL 82*  CO2 41*  BUN 18  CREATININE 0.52*  GLUCOSE 135*    Electrolytes Recent Labs  Lab 09/21/17 1700  CALCIUM 9.8    CBC Recent Labs  Lab 09/21/17 1700  WBC 6.0  HGB 14.2  HCT 42.4  PLT 88*    Coag's No results for input(s): APTT, INR in the last 168 hours.  Sepsis Markers No results for input(s): LATICACIDVEN, PROCALCITON, O2SATVEN in the last 168 hours.  ABG Recent Labs  Lab 09/21/17 1812  PHART 7.24*  PCO2ART 120*  PO2ART 46*    Liver Enzymes Recent Labs  Lab 09/21/17 1700  AST 22  ALT 18  ALKPHOS 53  BILITOT 0.9  ALBUMIN 4.0    Cardiac Enzymes Recent Labs  Lab 09/21/17 1700  TROPONINI <0.03    Glucose Recent Labs  Lab 09/21/17 2044  GLUCAP 136*    Imaging Dg Chest Port 1 View  Result Date: 09/21/2017 CLINICAL DATA:  Weakness and shortness of breath onset today. Recently treated for pneumonia. EXAM: PORTABLE CHEST 1 VIEW COMPARISON:  Chest x-rays dated 08/02/2016 and 01/30/2016. FINDINGS: Ill-defined opacity at the medial aspects of the RIGHT lung base, likely pneumonia. Lungs are hyperexpanded, with  associated emphysematous changes in the upper lobes. Heart size and mediastinal contours are stable. Aortic atherosclerosis. No acute or suspicious osseous finding. IMPRESSION: 1. New ill-defined opacity at the medial aspects of the RIGHT lung base, presumed pneumonia. 2. COPD/emphysema. 3. Aortic atherosclerosis. Electronically Signed   By: Franki Cabot M.D.   On: 09/21/2017 17:58    STUDIES:  None  CULTURES: Cultures x2  ANTIBIOTICS: Azithromycin September 21, 2017> Ceftriaxone September 21, 2017>  SIGNIFICANT EVENTS: 09/21/2017: Admitted with pneumonia  LINES/TUBES: Peripheral IVs  DISCUSSION: 77 year old African-American male presenting with right lower lobe pneumonia, protein calorie malnutrition, acute COPD exacerbation and acute hypoxic respiratory failure necessitating BiPAP  ASSESSMENT Acute hypoxic respiratory failure secondary to pneumonia Community-acquired pneumonia/right lower lobe Acute on chronic COPD exacerbation-patient is on home O2 at 2.5 L nasal cannula Tobacco use disorder Protein calorie malnutrition Secondary polycythemia Chronic thrombocytopenia   PLAN Hemodynamics per ICU protocol Continuous BiPAP and titrate to nasal cannula as tolerated Antibiotics as above Follow-up cultures IV fluids Nutritional consult Oral intake as tolerated  Nebulized bronchodilators IV steroids Smoking cessation advice given Nicotine patch 21 mg topically daily Remeron 7.5 mg daily for appetite stimulation Continue inhaled steroids ABG and chest x-ray as needed GI and DVT prophylaxis  FAMILY  - Updates: Family updated on current treatment plan at bedside  Zala Degrasse S. [facility] ANP-BC Pulmonary and Critical Care Medicine [facility] Pager [phone] or [phone]  NB: This document was prepared using Dragon voice recognition software and may include unintentional dictation errors.    09/21/2017, 9:03 PM

## 2017-09-22 DIAGNOSIS — L8990 Pressure ulcer of unspecified site, unspecified stage: Secondary | ICD-10-CM

## 2017-09-22 DIAGNOSIS — J9601 Acute respiratory failure with hypoxia: Secondary | ICD-10-CM

## 2017-09-22 LAB — BLOOD GAS, ARTERIAL
ACID-BASE EXCESS: 17.1 mmol/L — AB (ref 0.0–2.0)
Bicarbonate: 46.9 mmol/L — ABNORMAL HIGH (ref 20.0–28.0)
DELIVERY SYSTEMS: POSITIVE
Expiratory PAP: 8
FIO2: 45
Inspiratory PAP: 24
MECHANICAL RATE: 16
O2 Saturation: 94.1 %
PATIENT TEMPERATURE: 37
PCO2 ART: 89 mmHg — AB (ref 32.0–48.0)
pH, Arterial: 7.33 — ABNORMAL LOW (ref 7.350–7.450)
pO2, Arterial: 76 mmHg — ABNORMAL LOW (ref 83.0–108.0)

## 2017-09-22 LAB — BASIC METABOLIC PANEL
ANION GAP: 8 (ref 5–15)
BUN: 23 mg/dL — ABNORMAL HIGH (ref 6–20)
CO2: 38 mmol/L — AB (ref 22–32)
Calcium: 8.9 mg/dL (ref 8.9–10.3)
Chloride: 89 mmol/L — ABNORMAL LOW (ref 101–111)
Creatinine, Ser: 0.6 mg/dL — ABNORMAL LOW (ref 0.61–1.24)
GFR calc Af Amer: >60 mL/min (ref >60)
GLUCOSE: 119 mg/dL — AB (ref 65–99)
POTASSIUM: 4.9 mmol/L (ref 3.5–5.1)
Sodium: 135 mmol/L (ref 135–145)

## 2017-09-22 LAB — CBC
HEMATOCRIT: 34.5 % — AB (ref 40.0–52.0)
HEMOGLOBIN: 11.8 g/dL — AB (ref 13.0–18.0)
MCH: 34.7 pg — ABNORMAL HIGH (ref 26.0–34.0)
MCHC: 34.2 g/dL (ref 32.0–36.0)
MCV: 101.5 fL — ABNORMAL HIGH (ref 80.0–100.0)
Platelets: 67 10*3/uL — ABNORMAL LOW (ref 150–440)
RBC: 3.4 MIL/uL — ABNORMAL LOW (ref 4.40–5.90)
RDW: 14.1 % (ref 11.5–14.5)
WBC: 3.7 10*3/uL — ABNORMAL LOW (ref 3.8–10.6)

## 2017-09-22 LAB — PREALBUMIN: Prealbumin: <5 mg/dL — ABNORMAL LOW (ref 18–38)

## 2017-09-22 LAB — PROCALCITONIN: Procalcitonin: <0.1 ng/mL

## 2017-09-22 MED ORDER — IPRATROPIUM-ALBUTEROL 0.5-2.5 (3) MG/3ML IN SOLN
3.0000 mL | Freq: Three times a day (TID) | RESPIRATORY_TRACT | Status: DC
  Administered 2017-09-22 – 2017-09-23 (×3): 3 mL via RESPIRATORY_TRACT
  Filled 2017-09-22 (×4): qty 3

## 2017-09-22 MED ORDER — SODIUM CHLORIDE 0.9 % IV SOLN: INTRAVENOUS | Status: DC

## 2017-09-22 MED ORDER — NICOTINE 21 MG/24HR TD PT24
21.0000 mg | MEDICATED_PATCH | Freq: Every day | TRANSDERMAL | Status: DC
  Administered 2017-09-22 – 2017-09-23 (×2): 21 mg via TRANSDERMAL
  Filled 2017-09-22 (×2): qty 1

## 2017-09-22 MED ORDER — SODIUM CHLORIDE 0.9 % IV BOLUS
1000.0000 mL | Freq: Once | INTRAVENOUS | Status: AC
  Administered 2017-09-22: 1000 mL via INTRAVENOUS

## 2017-09-22 MED ORDER — IPRATROPIUM-ALBUTEROL 0.5-2.5 (3) MG/3ML IN SOLN: 3.0000 mL | RESPIRATORY_TRACT | Status: DC | PRN

## 2017-09-22 MED ORDER — MIRTAZAPINE 15 MG PO TABS
7.5000 mg | ORAL_TABLET | Freq: Every day | ORAL | Status: DC
Start: 2017-09-22 — End: 2017-09-23
  Administered 2017-09-22: 7.5 mg via ORAL
  Filled 2017-09-22: qty 1

## 2017-09-22 NOTE — Progress Notes (Signed)
Bipap refused

## 2017-09-22 NOTE — Progress Notes (Signed)
Pt taken off BIPAP, no shortness of breath noted, placed on 4lpm [place],sats 94%, respiartory rate 18/min, will continue to monitor, RN aware

## 2017-09-22 NOTE — Evaluation (Signed)
Clinical/Bedside Swallow Evaluation Patient Details  Name: Patrick Ayala MRN: [id number] Date of Birth: 1940-09-25  Today's Date: 09/22/2017 Time: SLP Start Time (ACUTE ONLY): 0959 SLP Stop Time (ACUTE ONLY): 1024 SLP Time Calculation (min) (ACUTE ONLY): 25 min  Past Medical History:  Past Medical History:  Diagnosis Date  . Carboxyhemoglobinemia   . COPD (chronic obstructive pulmonary disease) ([facility])    on 2.5L chronic home o2  . Polycythemia, secondary 10/20/2014  . Thrombocytopenia ([place])    Past Surgical History:  Past Surgical History:  Procedure Laterality Date  . APPENDECTOMY    . EYE SURGERY Right    cataract  . HERNIA REPAIR    . polycythemia     HPI:      Assessment / Plan / Recommendation Clinical Impression  pt had a  min to mild oral pharyngeal dysphagia as characterized by coughing with thin liquids via straw. pt was given trials of thin liquids, puree, and MS, regular solids to assess for safety with diet. pt had no overt ssx aspiration with solids and O2 stats remained the same at 91% throughout intake of solids and liquids. pt did not have any noted change in respiratory status. pt did have noted cough with thin via straw but no immediate cough with thin via cup. pt was noted to have a baseline cough that appeared productive. diet recommendation to be regular with thin no straws.  SLP Visit Diagnosis: Dysphagia, oropharyngeal phase (R13.12)    Aspiration Risk  Mild aspiration risk    Diet Recommendation Regular;Thin liquid   Liquid Administration via: No straw;Cup Medication Administration: Crushed with puree Supervision: Patient able to self feed Compensations: Slow rate;Follow solids with liquid;Small sips/bites Postural Changes: Seated upright at 90 degrees    Other  Recommendations Oral Care Recommendations: Oral care BID   Follow up Recommendations        Frequency and Duration min 4x/week  2 weeks       Prognosis Prognosis for Safe Diet  Advancement: Good Barriers to Reach Goals: Severity of deficits      Swallow Study   General Date of Onset: 09/22/17 Type of Study: Bedside Swallow Evaluation Diet Prior to this Study: Regular;Thin liquids Temperature Spikes Noted: No Respiratory Status: Nasal cannula History of Recent Intubation: No Behavior/Cognition: Alert;Cooperative;Pleasant mood Oral Cavity Assessment: Within Functional Limits Oral Care Completed by SLP: No Oral Cavity - Dentition: Adequate natural dentition Vision: Functional for self-feeding Self-Feeding Abilities: Able to feed self Patient Positioning: Upright in bed Baseline Vocal Quality: Normal Volitional Cough: Strong Volitional Swallow: Able to elicit    Oral/Motor/Sensory Function Overall Oral Motor/Sensory Function: Within functional limits   Ice Chips Ice chips: Within functional limits Presentation: Spoon   Thin Liquid Thin Liquid: Within functional limits Presentation: Cup Other Comments: pt had coughing with thin via straw. SLP d/c straw use.   pt had baseline cough     Nectar Thick Nectar Thick Liquid: Not tested   Honey Thick Honey Thick Liquid: Not tested   Puree Puree: Within functional limits Presentation: Spoon;Self Fed   Solid   GO   Solid: Within functional limits Presentation: Self Fed;Spoon        [place] Sauber 09/22/2017,10:31 AM

## 2017-09-22 NOTE — Progress Notes (Addendum)
[place] [facility] [place] NAME: Patrick Ayala    MR#:  [id number]  DATE OF BIRTH:  [birth year]  SUBJECTIVE:  Patient seen and evaluated today Decreased shortness of breath Decreased wheezing No fever, chills Weaned off bipap  REVIEW OF SYSTEMS:    ROS  CONSTITUTIONAL: No documented fever. No fatigue, weakness. No weight gain, no weight loss.  EYES: No blurry or double vision.  ENT: No tinnitus. No postnasal drip. No redness of the oropharynx.  RESPIRATORY: occasional cough, has wheeze, no hemoptysis. Has dyspnea.  CARDIOVASCULAR: No chest pain. No orthopnea. No palpitations. No syncope.  GASTROINTESTINAL: No nausea, no vomiting or diarrhea. No abdominal pain. No melena or hematochezia.  GENITOURINARY: No dysuria or hematuria.  ENDOCRINE: No polyuria or nocturia. No heat or cold intolerance.  HEMATOLOGY: No anemia. No bruising. No bleeding.  INTEGUMENTARY: No rashes. No lesions.  MUSCULOSKELETAL: No arthritis. No swelling. No gout.  NEUROLOGIC: No numbness, tingling, or ataxia. No seizure-type activity.  PSYCHIATRIC: No anxiety. No insomnia. No ADD.   DRUG ALLERGIES:   Allergies  Allergen Reactions  . No Known Allergies     VITALS:  Blood pressure 124/66, pulse 90, temperature 97.9 F (36.6 C), temperature source Oral, resp. rate 13, height _0  (1.753 m), weight 53.1 kg (117 lb 1 oz), SpO2 94 %.  PHYSICAL EXAMINATION:   Physical Exam  GENERAL:  77 y.o.-year-old patient lying in the bed with no acute distress.  EYES: Pupils equal, round, reactive to light and accommodation. No scleral icterus. Extraocular muscles intact.  HEENT: Head atraumatic, normocephalic. Oropharynx and nasopharynx clear.  NECK:  Supple, no jugular venous distention. No thyroid enlargement, no tenderness.  LUNGS: Decreased breath sounds bilaterally, bilateral wheezing. No use of accessory muscles of respiration.  CARDIOVASCULAR: S1, S2 normal. No murmurs,  rubs, or gallops.  ABDOMEN: Soft, nontender, nondistended. Bowel sounds present. No organomegaly or mass.  EXTREMITIES: No cyanosis, clubbing or edema b/l.    NEUROLOGIC: Cranial nerves II through XII are intact. No focal Motor or sensory deficits b/l.   PSYCHIATRIC: The patient is alert and oriented x 3.  SKIN: No obvious rash, lesion, or ulcer.   LABORATORY PANEL:   CBC Recent Labs  Lab 09/22/17 0547  WBC 3.7*  HGB 11.8*  HCT 34.5*  PLT 67*   ------------------------------------------------------------------------------------------------------------------ Chemistries  Recent Labs  Lab 09/21/17 1700 09/21/17 2140 09/22/17 0547  NA 135  --  135  K 4.7  --  4.9  CL 82*  --  89*  CO2 41*  --  38*  GLUCOSE 135*  --  119*  BUN 18  --  23*  CREATININE 0.52*  --  0.60*  CALCIUM 9.8  --  8.9  MG  --  2.1  --   AST 22  --   --   ALT 18  --   --   ALKPHOS 53  --   --   BILITOT 0.9  --   --    ------------------------------------------------------------------------------------------------------------------  Cardiac Enzymes Recent Labs  Lab 09/21/17 2140  TROPONINI <0.03   ------------------------------------------------------------------------------------------------------------------  RADIOLOGY:  Dg Chest Port 1 View  Result Date: 09/21/2017 CLINICAL DATA:  Weakness and shortness of breath onset today. Recently treated for pneumonia. EXAM: PORTABLE CHEST 1 VIEW COMPARISON:  Chest x-rays dated 08/02/2016 and 01/30/2016. FINDINGS: Ill-defined opacity at the medial aspects of the RIGHT lung base, likely pneumonia. Lungs are hyperexpanded, with associated emphysematous changes in the upper lobes. Heart size and  mediastinal contours are stable. Aortic atherosclerosis. No acute or suspicious osseous finding. IMPRESSION: 1. New ill-defined opacity at the medial aspects of the RIGHT lung base, presumed pneumonia. 2. COPD/emphysema. 3. Aortic atherosclerosis. Electronically Signed    By: Franki Cabot M.D.   On: 09/21/2017 17:58     ASSESSMENT AND PLAN:   77 year old male patient with history of chronic respiratory failure on home oxygen, polycythemia presented to the hospital for shortness of breath.  -Acute on chronic respiratory failure with hypercapnia Weaned off BiPAP Continue oxygen via nasal cannula Continue IV Solu-Medrol and nebulization treatments  transfer to medical floor today  -Community-acquired pneumonia Continue IV Rocephin and Zithromax antibiotics  -Polycythemia Follow-up with hematology outpatient  -DVT prophylaxis with subcu Lovenox daily  -Hypertension Continue ACE inhibitor and Norvasc  -Tobacco cessation counseled to patient for six minutes Nicotine patch offered.   All the records are reviewed and case discussed with Care Management/Social Worker. Management plans discussed with the patient, family and they are in agreement.  CODE STATUS: Full code  DVT Prophylaxis: SCDs  TOTAL TIME TAKING CARE OF THIS PATIENT: 36 minutes.   POSSIBLE D/C IN 2 to 3 DAYS, DEPENDING ON CLINICAL CONDITION.  Saundra Shelling M.D on 09/22/2017 at 11:47 AM  Between 7am to 6pm - Pager - [phone]  After 6pm go to [email or web address] - password [email or web address] [facility]  Office  [phone]  CC: Primary care physician; Guadalupe Maple, MD  Note: This dictation was prepared with Dragon dictation along with smaller phrase technology. Any transcriptional errors that result from this process are unintentional.

## 2017-09-22 NOTE — Progress Notes (Signed)
Pt has gone >8hrs without voiding, bladder scanned and 178 ml in bladder. NP Tukov notified.

## 2017-09-23 LAB — PROCALCITONIN: Procalcitonin: <0.1 ng/mL

## 2017-09-23 MED ORDER — PREDNISONE 20 MG PO TABS
40.0000 mg | ORAL_TABLET | Freq: Every day | ORAL | Status: DC
  Administered 2017-09-23: 40 mg via ORAL
  Filled 2017-09-23: qty 2

## 2017-09-23 MED ORDER — PREDNISONE 10 MG PO TABS: 10.0000 mg | ORAL_TABLET | Freq: Every day | ORAL | 0 refills | Status: DC

## 2017-09-23 MED ORDER — AMOXICILLIN-POT CLAVULANATE 875-125 MG PO TABS: 1.0000 | ORAL_TABLET | Freq: Two times a day (BID) | ORAL | 0 refills | Status: AC

## 2017-09-23 NOTE — Progress Notes (Signed)
Discharge teaching given to patient, patient verbalized understanding and had no questions. Patient IV removed. Patient will be transported home by family. All patient belongings gathered prior to leaving. Family had patient's personal oxygen tank on hand prior to leaving.

## 2017-09-23 NOTE — Discharge Summary (Addendum)
[facility] NAME: Patrick Ayala    MR#:  [id number]  DATE OF BIRTH:  [birth year]  DATE OF ADMISSION:  09/21/2017 ADMITTING PHYSICIAN: Gladstone Lighter, MD  DATE OF DISCHARGE: No discharge date for patient encounter.  PRIMARY CARE PHYSICIAN: Guadalupe Maple, MD   ADMISSION DIAGNOSIS:  SOB (shortness of breath) [R06.02] COPD exacerbation (HCC) [J44.1] Acute respiratory failure with hypoxia (HCC) [J96.01]  DISCHARGE DIAGNOSIS:  Active Problems:   COPD exacerbation (HCC)   Malnutrition (HCC)   Pressure injury of skin Acute respiratory distress with hypoxia  SECONDARY DIAGNOSIS:   Past Medical History:  Diagnosis Date  . Carboxyhemoglobinemia   . COPD (chronic obstructive pulmonary disease) (HCC)    on 2.5L chronic home o2  . Polycythemia, secondary 10/20/2014  . Thrombocytopenia ([place])      ADMITTING HISTORY Patrick Ayala  is a 77 y.o. male with a known history of chronic respiratory failure secondary to COPD on 2.5 L home oxygen, polycythemia with erythrocytosis and thrombocytopenia presents to hospital secondary to worsening shortness of breath going on for almost 3 to 4 days now.Patient is active and independent at baseline.  Is been having worsening shortness of breath with dry cough in the last 3 to 4 days.  He went to see his PCP, was noted to have oxygen saturations in the 70s and so sent to the emergency room.  Complains of weakness and fatigue.  No fevers or chills no nausea or vomiting.  Chest x-ray showing right lower lobe infiltrate consistent with pneumonia.  Also has COPD exacerbation with scant breath sounds.  ABG showing respiratory acidosis with CO2 of 120, placed on BiPAP at this time.  Still alert and oriented.  Will be admitted to stepdown for respiratory failure   HOSPITAL COURSE:  Patient was initially admitted to stepdown and put on BiPAP for respiratory distress.  Patient received IV Solu-Medrol and  nebulization treatments aggressively along with IV Rocephin and Zithromax antibiotics patient was seen by intensivist.  During the hospitalization.  MRSA PCR was negative cultures did not reveal any growth patient tolerated IV Solu-Medrol well.  And shortness of breath improved with aggressive nebulizer treatments and wheezing decreased.  Patient was weaned off BiPAP and transferred to the medical floor.  Patient was switched to oral steroids and back to baseline oxygen via nasal cannula.  He ambulated well on the medical floor with oxygen with no respiratory distress.  Patient will be discharged home on oral tapering dose of steroids along with oral Augmentin antibiotic.  Patient will be discharged home with home health services.  CONSULTS OBTAINED:    DRUG ALLERGIES:   Allergies  Allergen Reactions  . No Known Allergies     DISCHARGE MEDICATIONS:   Allergies as of 09/23/2017      Reactions   No Known Allergies       Medication List    TAKE these medications   albuterol 108 (90 Base) MCG/ACT inhaler Commonly known as:  PROVENTIL HFA;VENTOLIN HFA Inhale 1-2 puffs into the lungs every 6 (six) hours as needed for wheezing or shortness of breath.   amLODipine 5 MG tablet Commonly known as:  NORVASC Take 1 tablet (5 mg total) by mouth daily.   amoxicillin-clavulanate 875-125 MG tablet Commonly known as:  AUGMENTIN Take 1 tablet by mouth 2 (two) times daily for 5 days.   aspirin 81 MG tablet ASA 81 mg po once a day (start taking it from 08/02/2013)  Quantity: 0;  Refills: 0  Active   atorvastatin 20 MG tablet Commonly known as:  LIPITOR Take 1 tablet (20 mg total) daily by mouth.   benazepril 40 MG tablet Commonly known as:  LOTENSIN Take 1 tablet (40 mg total) by mouth daily.   budesonide-formoterol 160-4.5 MCG/ACT inhaler Commonly known as:  SYMBICORT Inhale 2 puffs into the lungs every morning. INHALE 2 TIMES DAILY   SYMBICORT 160-4.5 MCG/ACT inhaler Generic drug:   budesonide-formoterol INHALE 2 PUFFS INTO THE LUNGS EVERY MORNING. INHALE 2 TIMES DAILY   predniSONE 10 MG tablet Commonly known as:  DELTASONE Take 1 tablet (10 mg total) by mouth daily. Label  & dispense according to the schedule below.  6 tablets day one, then 5 table day 2, then 4 tablets day 3, then 3 tablets day 4, 2 tablets day 5, then 1 tablet day 6, then stop       Today  Patient seen and evaluated on the day of discharge Decreased shortness of breath No wheezing No chest pain Tolerates diet well  tobacco cessation counseled to the patient for 6 minutes Nicotine patch offered  VITAL SIGNS:  Blood pressure 129/62, pulse 65, temperature 98.2 F (36.8 C), temperature source Oral, resp. rate 20, height _0  (1.753 m), weight 53.1 kg (117 lb 1 oz), SpO2 94 %.  I/O:    Intake/Output Summary (Last 24 hours) at 09/23/2017 1244 Last data filed at 09/23/2017 1034 Gross per 24 hour  Intake 893.75 ml  Output 0 ml  Net 893.75 ml    PHYSICAL EXAMINATION:  Physical Exam  GENERAL:  77 y.o.-year-old patient lying in the bed with no acute distress.  LUNGS: Normal breath sounds bilaterally, no wheezing, rales,rhonchi or crepitation. No use of accessory muscles of respiration.  CARDIOVASCULAR: S1, S2 normal. No murmurs, rubs, or gallops.  ABDOMEN: Soft, non-tender, non-distended. Bowel sounds present. No organomegaly or mass.  NEUROLOGIC: Moves all 4 extremities. PSYCHIATRIC: The patient is alert and oriented x 3.  SKIN: No obvious rash, lesion, or ulcer.   DATA REVIEW:   CBC Recent Labs  Lab 09/22/17 0547  WBC 3.7*  HGB 11.8*  HCT 34.5*  PLT 67*    Chemistries  Recent Labs  Lab 09/21/17 1700 09/21/17 2140 09/22/17 0547  NA 135  --  135  K 4.7  --  4.9  CL 82*  --  89*  CO2 41*  --  38*  GLUCOSE 135*  --  119*  BUN 18  --  23*  CREATININE 0.52*  --  0.60*  CALCIUM 9.8  --  8.9  MG  --  2.1  --   AST 22  --   --   ALT 18  --   --   ALKPHOS 53  --   --    BILITOT 0.9  --   --     Cardiac Enzymes Recent Labs  Lab 09/21/17 2140  TROPONINI <0.03    Microbiology Results  Results for orders placed or performed during the hospital encounter of 09/21/17  Blood Culture (routine x 2)     Status: None (Preliminary result)   Collection Time: 09/21/17  6:43 PM  Result Value Ref Range Status   Specimen Description BLOOD LEFT ANTECUBITAL  Final   Special Requests   Final    Blood Culture results may not be optimal due to an excessive volume of blood received in culture bottles   Culture   Final    NO GROWTH  2 DAYS Performed at [facility], [address] [place], [place] [address]    Report Status PENDING  Incomplete  Blood Culture (routine x 2)     Status: None (Preliminary result)   Collection Time: 09/21/17  6:43 PM  Result Value Ref Range Status   Specimen Description BLOOD RIGHT ANTECUBITAL  Final   Special Requests Blood Culture adequate volume  Final   Culture   Final    NO GROWTH 2 DAYS Performed at [facility], [address] [place], [place] [address]    Report Status PENDING  Incomplete  MRSA PCR Screening     Status: None   Collection Time: 09/21/17  9:16 PM  Result Value Ref Range Status   MRSA by PCR NEGATIVE NEGATIVE Final    Comment:        The GeneXpert MRSA Assay (FDA approved for NASAL specimens only), is one component of a comprehensive MRSA colonization surveillance program. It is not intended to diagnose MRSA infection nor to guide or monitor treatment for MRSA infections. Performed at [facility], [address], [place], [place] [address]     RADIOLOGY:  Dg Chest Port 1 View  Result Date: 09/21/2017 CLINICAL DATA:  Weakness and shortness of breath onset today. Recently treated for pneumonia. EXAM: PORTABLE CHEST 1 VIEW COMPARISON:  Chest x-rays dated 08/02/2016 and 01/30/2016. FINDINGS: Ill-defined opacity at the medial aspects of the RIGHT lung base, likely  pneumonia. Lungs are hyperexpanded, with associated emphysematous changes in the upper lobes. Heart size and mediastinal contours are stable. Aortic atherosclerosis. No acute or suspicious osseous finding. IMPRESSION: 1. New ill-defined opacity at the medial aspects of the RIGHT lung base, presumed pneumonia. 2. COPD/emphysema. 3. Aortic atherosclerosis. Electronically Signed   By: Franki Cabot M.D.   On: 09/21/2017 17:58    Follow up with PCP in 1 week.  Management plans discussed with the patient, family and they are in agreement.  CODE STATUS: Full code    Code Status Orders  (From admission, onward)        Start     Ordered   09/21/17 2059  Full code  Continuous     09/21/17 2058    Code Status History    This patient has a current code status but no historical code status.      TOTAL TIME TAKING CARE OF THIS PATIENT ON DAY OF DISCHARGE: more than 35 minutes.   Saundra Shelling M.D on 09/23/2017 at 12:44 PM  Between 7am to 6pm - Pager - [phone]  After 6pm go to [email or web address] - password [email or web address] [facility]  Office  [phone]  CC: Primary care physician; Guadalupe Maple, MD  Note: This dictation was prepared with Dragon dictation along with smaller phrase technology. Any transcriptional errors that result from this process are unintentional.

## 2017-09-23 NOTE — Care Management (Signed)
Patient to be discharged. Currently on chronic O2 @ 3L via [place]. Per patient and family no other DME or RNCM needs. Will sign off.

## 2017-09-23 NOTE — Progress Notes (Signed)
PT Cancellation Note  Patient Details Name: MUZAMIL HARKER MRN: [id number] DOB: November 10, 1940   Cancelled Treatment:    Reason Eval/Treat Not Completed: Other (comment): Upon entering room nursing present and stated pt has been up and walking very well with nursing and has no PT needs at this time.  Per nursing request will complete PT orders at this time and will reassess pt pending a change in status upon receipt of new PT orders.    Linus Salmons PT, DPT 09/23/17, 2:25 PM

## 2017-09-24 ENCOUNTER — Telehealth: Payer: Self-pay

## 2017-09-24 NOTE — Telephone Encounter (Signed)
Transition Care Management Follow-up Telephone Call  How have you been since you were released from the hospital? "feeling much better today"  Do you understand why you were in the hospital? yes  Do you have a copy of your discharge instructions Yes Do you understand the discharge instrcutions? yes  Where were you discharged to? Home  Do you have support at home? Yes    Items Reviewed:  Medications obtained Yes  Medications reviewed: Yes  Dietary changes reviewed: yes  Home Health? Yes, unknown ageny  DME ordered at discharge obtained? No  Medical supplies: NA,     Functional Questionnaire:   Activities of Daily Living (ADLs):   He states they are independent in the following: feeding, continence and toileting States they require assistance with the following: ambulation, bathing and hygiene, grooming, dressing and medication management  Any transportation issues/concerns?: no  Any patient concerns? no  Confirmed importance and date/time of follow-up visits scheduled with PCP: yes, cancelled cpe with Dr.Crissman on 6/18 per pt request, hospital follow up schedule for 09/27/2017 at 2:30 with [place] appointment scheduled with specialist? Yes  Confirmed with patient if condition begins to worsen call PCP or If it's emergency go to the ER.

## 2017-09-25 ENCOUNTER — Encounter: Payer: Medicare HMO | Admitting: Family Medicine

## 2017-09-26 LAB — CULTURE, BLOOD (ROUTINE X 2)
Culture: NO GROWTH
Culture: NO GROWTH
SPECIAL REQUESTS: ADEQUATE

## 2017-09-27 ENCOUNTER — Ambulatory Visit: Payer: Medicare HMO

## 2017-09-27 ENCOUNTER — Inpatient Hospital Stay: Payer: Medicare HMO | Admitting: Physician Assistant

## 2017-10-01 ENCOUNTER — Inpatient Hospital Stay: Payer: Medicare HMO | Admitting: Physician Assistant

## 2017-10-04 ENCOUNTER — Inpatient Hospital Stay: Payer: Medicare HMO | Attending: Hematology and Oncology

## 2017-10-05 ENCOUNTER — Telehealth: Payer: Self-pay

## 2017-10-05 NOTE — Telephone Encounter (Signed)
Copied from [place] [id number]. Topic: Medicare AWV >> Oct 05, 2017 12:38 PM [place], [place] A, LPN wrote: Reason for CRM: Called patient due to cancelled appointment on 09/19/2017 for awv , Please reschedule with  nurse health advisor at next available time. Please contact Tiffany Hill,LPN at [facility] if patient needs transportation assistance.

## 2017-11-01 ENCOUNTER — Inpatient Hospital Stay: Payer: Medicare HMO | Attending: Hematology and Oncology

## 2017-11-01 ENCOUNTER — Other Ambulatory Visit: Payer: Self-pay | Admitting: Urgent Care

## 2017-11-01 DIAGNOSIS — E538 Deficiency of other specified B group vitamins: Secondary | ICD-10-CM | POA: Diagnosis not present

## 2017-11-01 MED ORDER — CYANOCOBALAMIN 1000 MCG/ML IJ SOLN
1000.0000 ug | Freq: Once | INTRAMUSCULAR | Status: AC
  Administered 2017-11-01: 1000 ug via INTRAMUSCULAR

## 2017-11-02 ENCOUNTER — Other Ambulatory Visit: Payer: Self-pay

## 2017-11-02 ENCOUNTER — Encounter: Payer: Self-pay | Admitting: Emergency Medicine

## 2017-11-02 ENCOUNTER — Inpatient Hospital Stay
Admission: EM | Admit: 2017-11-02 | Discharge: 2017-11-07 | DRG: 377 | Disposition: A | Discharge status: Home / Self Care | Payer: Medicare HMO | Attending: Internal Medicine | Admitting: Internal Medicine

## 2017-11-02 DIAGNOSIS — D123 Benign neoplasm of transverse colon: Secondary | ICD-10-CM

## 2017-11-02 DIAGNOSIS — E43 Unspecified severe protein-calorie malnutrition: Secondary | ICD-10-CM | POA: Diagnosis not present

## 2017-11-02 DIAGNOSIS — Z7951 Long term (current) use of inhaled steroids: Secondary | ICD-10-CM

## 2017-11-02 DIAGNOSIS — F17210 Nicotine dependence, cigarettes, uncomplicated: Secondary | ICD-10-CM | POA: Diagnosis present

## 2017-11-02 DIAGNOSIS — K259 Gastric ulcer, unspecified as acute or chronic, without hemorrhage or perforation: Secondary | ICD-10-CM | POA: Diagnosis present

## 2017-11-02 DIAGNOSIS — N400 Enlarged prostate without lower urinary tract symptoms: Secondary | ICD-10-CM | POA: Diagnosis present

## 2017-11-02 DIAGNOSIS — Z681 Body mass index (BMI) 19 or less, adult: Secondary | ICD-10-CM

## 2017-11-02 DIAGNOSIS — E785 Hyperlipidemia, unspecified: Secondary | ICD-10-CM | POA: Diagnosis not present

## 2017-11-02 DIAGNOSIS — K621 Rectal polyp: Secondary | ICD-10-CM | POA: Diagnosis not present

## 2017-11-02 DIAGNOSIS — K5730 Diverticulosis of large intestine without perforation or abscess without bleeding: Secondary | ICD-10-CM | POA: Diagnosis not present

## 2017-11-02 DIAGNOSIS — J441 Chronic obstructive pulmonary disease with (acute) exacerbation: Secondary | ICD-10-CM | POA: Diagnosis not present

## 2017-11-02 DIAGNOSIS — D128 Benign neoplasm of rectum: Secondary | ICD-10-CM | POA: Diagnosis not present

## 2017-11-02 DIAGNOSIS — J449 Chronic obstructive pulmonary disease, unspecified: Secondary | ICD-10-CM | POA: Diagnosis not present

## 2017-11-02 DIAGNOSIS — Z7952 Long term (current) use of systemic steroids: Secondary | ICD-10-CM

## 2017-11-02 DIAGNOSIS — Z9849 Cataract extraction status, unspecified eye: Secondary | ICD-10-CM

## 2017-11-02 DIAGNOSIS — K626 Ulcer of anus and rectum: Secondary | ICD-10-CM | POA: Diagnosis not present

## 2017-11-02 DIAGNOSIS — K08409 Partial loss of teeth, unspecified cause, unspecified class: Secondary | ICD-10-CM | POA: Diagnosis present

## 2017-11-02 DIAGNOSIS — Z8719 Personal history of other diseases of the digestive system: Secondary | ICD-10-CM | POA: Insufficient documentation

## 2017-11-02 DIAGNOSIS — K921 Melena: Secondary | ICD-10-CM | POA: Diagnosis not present

## 2017-11-02 DIAGNOSIS — K635 Polyp of colon: Secondary | ICD-10-CM | POA: Diagnosis not present

## 2017-11-02 DIAGNOSIS — K2960 Other gastritis without bleeding: Secondary | ICD-10-CM | POA: Diagnosis not present

## 2017-11-02 DIAGNOSIS — I10 Essential (primary) hypertension: Secondary | ICD-10-CM | POA: Diagnosis not present

## 2017-11-02 DIAGNOSIS — K625 Hemorrhage of anus and rectum: Secondary | ICD-10-CM

## 2017-11-02 DIAGNOSIS — Z79899 Other long term (current) drug therapy: Secondary | ICD-10-CM | POA: Diagnosis not present

## 2017-11-02 DIAGNOSIS — I739 Peripheral vascular disease, unspecified: Secondary | ICD-10-CM | POA: Diagnosis not present

## 2017-11-02 DIAGNOSIS — D696 Thrombocytopenia, unspecified: Secondary | ICD-10-CM | POA: Diagnosis not present

## 2017-11-02 DIAGNOSIS — Z7982 Long term (current) use of aspirin: Secondary | ICD-10-CM

## 2017-11-02 DIAGNOSIS — Z9981 Dependence on supplemental oxygen: Secondary | ICD-10-CM

## 2017-11-02 DIAGNOSIS — D62 Acute posthemorrhagic anemia: Secondary | ICD-10-CM | POA: Diagnosis present

## 2017-11-02 DIAGNOSIS — D751 Secondary polycythemia: Secondary | ICD-10-CM | POA: Diagnosis present

## 2017-11-02 DIAGNOSIS — K922 Gastrointestinal hemorrhage, unspecified: Secondary | ICD-10-CM | POA: Diagnosis present

## 2017-11-02 DIAGNOSIS — R634 Abnormal weight loss: Secondary | ICD-10-CM | POA: Diagnosis not present

## 2017-11-02 DIAGNOSIS — D126 Benign neoplasm of colon, unspecified: Secondary | ICD-10-CM | POA: Diagnosis not present

## 2017-11-02 LAB — COMPREHENSIVE METABOLIC PANEL
ALK PHOS: 46 U/L (ref 38–126)
ALT: 18 U/L (ref 0–44)
ANION GAP: 5 (ref 5–15)
AST: 25 U/L (ref 15–41)
Albumin: 4.2 g/dL (ref 3.5–5.0)
BILIRUBIN TOTAL: 0.7 mg/dL (ref 0.3–1.2)
BUN: 15 mg/dL (ref 8–23)
CALCIUM: 8.8 mg/dL — AB (ref 8.9–10.3)
CO2: 41 mmol/L — ABNORMAL HIGH (ref 22–32)
CREATININE: 0.59 mg/dL — AB (ref 0.61–1.24)
Chloride: 94 mmol/L — ABNORMAL LOW (ref 98–111)
Glucose, Bld: 218 mg/dL — ABNORMAL HIGH (ref 70–99)
Potassium: 3.9 mmol/L (ref 3.5–5.1)
Sodium: 140 mmol/L (ref 135–145)
Total Protein: 6.4 g/dL — ABNORMAL LOW (ref 6.5–8.1)

## 2017-11-02 LAB — CBC
HCT: 31.8 % — ABNORMAL LOW (ref 40.0–52.0)
HEMOGLOBIN: 10.6 g/dL — AB (ref 13.0–18.0)
MCH: 33.3 pg (ref 26.0–34.0)
MCHC: 33.3 g/dL (ref 32.0–36.0)
MCV: 100 fL (ref 80.0–100.0)
PLATELETS: 79 10*3/uL — AB (ref 150–440)
RBC: 3.18 MIL/uL — AB (ref 4.40–5.90)
RDW: 15 % — ABNORMAL HIGH (ref 11.5–14.5)
WBC: 7.9 10*3/uL (ref 3.8–10.6)

## 2017-11-02 LAB — TYPE AND SCREEN
ABO/RH(D): A POS
ANTIBODY SCREEN: NEGATIVE

## 2017-11-02 LAB — PROTIME-INR
INR: 0.97
PROTHROMBIN TIME: 12.8 s (ref 11.4–15.2)

## 2017-11-02 LAB — APTT: aPTT: 29 seconds (ref 24–36)

## 2017-11-02 MED ORDER — SODIUM CHLORIDE 0.9 % IV SOLN
80.0000 mg | Freq: Once | INTRAVENOUS | Status: AC
  Administered 2017-11-02: 80 mg via INTRAVENOUS
  Filled 2017-11-02: qty 80

## 2017-11-02 MED ORDER — SODIUM CHLORIDE 0.9 % IV SOLN
8.0000 mg/h | INTRAVENOUS | Status: DC
  Administered 2017-11-02 – 2017-11-04 (×2): 8 mg/h via INTRAVENOUS
  Filled 2017-11-02 (×3): qty 80

## 2017-11-02 MED ORDER — PANTOPRAZOLE SODIUM 40 MG IV SOLR: 40.0000 mg | Freq: Two times a day (BID) | INTRAVENOUS | Status: DC

## 2017-11-02 NOTE — ED Provider Notes (Addendum)
[facility] Emergency Department Provider Note  ____________________________________________   I have reviewed the triage vital signs and the nursing notes. Where available I have reviewed prior notes and, if possible and indicated, outside hospital notes.    HISTORY  Chief Complaint Rectal Bleeding    HPI Patrick Ayala is a 77 y.o. male patient with history of diverticular bleeding in the past from diverticular disease, presents today after having 4 5 bloody bowel movements.  He denies any abdominal pain fever vomiting.  He states mostly bowel movements were loose and red and one was very bloody, and the others had blood as well, and then one was black.  Denies feeling lightheaded.Marland Kitchen  He does have a history of thrombocytopenia, and he is on aspirin.   Past Medical History:  Diagnosis Date  . Carboxyhemoglobinemia   . COPD (chronic obstructive pulmonary disease) (HCC)    on 2.5L chronic home o2  . Polycythemia, secondary 10/20/2014  . Thrombocytopenia [facility])     Patient Active Problem List   Diagnosis Date Noted  . Pressure injury of skin 09/22/2017  . COPD exacerbation ([place]) 09/21/2017  . Malnutrition ([place]) 09/21/2017  . Multiple pulmonary nodules 10/16/2016  . Advanced care planning/counseling discussion 09/19/2016  . Aortic atherosclerosis ([place]) 07/10/2016  . Pulmonary hypertension, primary ([place]) 07/10/2016  . BPH (benign prostatic hyperplasia) 07/19/2015  . Cataracts, bilateral 06/28/2015  . Essential hypertension 02/01/2015  . Hyperlipidemia 02/01/2015  . COPD (chronic obstructive pulmonary disease) ([place]) 02/01/2015  . B12 deficiency 01/27/2015  . Personal history of tobacco use, presenting hazards to health 12/21/2014  . Weight loss 12/07/2014  . Thrombocytopenia ([place]) 11/02/2014  . Polycythemia, secondary 10/20/2014    Past Surgical History:  Procedure Laterality Date  . APPENDECTOMY    . EYE SURGERY Right    cataract  . HERNIA REPAIR     . polycythemia      Prior to Admission medications   Medication Sig Start Date End Date Taking? Authorizing Provider  albuterol (PROVENTIL HFA;VENTOLIN HFA) 108 (90 Base) MCG/ACT inhaler Inhale 1-2 puffs into the lungs every 6 (six) hours as needed for wheezing or shortness of breath. 09/19/16   Guadalupe Maple, MD  amLODipine (NORVASC) 5 MG tablet Take 1 tablet (5 mg total) by mouth daily. 03/27/17   Guadalupe Maple, MD  aspirin 81 MG tablet ASA 81 mg po once a day (start taking it from 08/02/2013)   Quantity: 0;  Refills: 0  Active    [provider]  atorvastatin (LIPITOR) 20 MG tablet Take 1 tablet (20 mg total) daily by mouth. 02/16/17   Guadalupe Maple, MD  benazepril (LOTENSIN) 40 MG tablet Take 1 tablet (40 mg total) by mouth daily. 03/28/17   Guadalupe Maple, MD  budesonide-formoterol (SYMBICORT) 160-4.5 MCG/ACT inhaler Inhale 2 puffs into the lungs every morning. INHALE 2 TIMES DAILY 09/19/16   Guadalupe Maple, MD  predniSONE (DELTASONE) 10 MG tablet Take 1 tablet (10 mg total) by mouth daily. Label  & dispense according to the schedule below.  6 tablets day one, then 5 table day 2, then 4 tablets day 3, then 3 tablets day 4, 2 tablets day 5, then 1 tablet day 6, then stop 09/23/17   Saundra Shelling, MD  SYMBICORT 160-4.5 MCG/ACT inhaler INHALE 2 PUFFS INTO THE LUNGS EVERY MORNING. INHALE 2 TIMES DAILY 01/12/17   Volney American, PA-C    Allergies No known allergies  Family History  Problem Relation Age of  Onset  . Cancer Mother   . Cancer Sister   . Cancer Brother     Social History Social History   Tobacco Use  . Smoking status: Current Every Day Smoker    Packs/day: 0.50    Types: Cigarettes  . Smokeless tobacco: Never Used  . Tobacco comment: Smoking History 1(1)Packs per day  Substance Use Topics  . Alcohol use: Yes    Alcohol/week: 8.4 oz    Types: 14 Cans of beer per week    Comment: 2 cans of beer a night  . Drug use: No    Review of  Systems Constitutional: No fever/chills Eyes: No visual changes. ENT: No sore throat. No stiff neck no neck pain Cardiovascular: Denies chest pain. Respiratory: Denies shortness of breath. Gastrointestinal:   no vomiting.  No diarrhea.  No constipation. Genitourinary: Negative for dysuria. Musculoskeletal: Negative lower extremity swelling Skin: Negative for rash. Neurological: Negative for severe headaches, focal weakness or numbness.   ____________________________________________   PHYSICAL EXAM:  VITAL SIGNS: ED Triage Vitals  Enc Vitals Group     BP 11/02/17 1754 (!) 121/55     Pulse Rate 11/02/17 1754 (!) 115     Resp 11/02/17 1754 18     Temp 11/02/17 1754 98.1 F (36.7 C)     Temp Source 11/02/17 1754 Oral     SpO2 11/02/17 1754 95 %     Weight 11/02/17 1754 115 lb (52.2 kg)     Height 11/02/17 1754 _0  (1.753 m)     Head Circumference --      Peak Flow --      Pain Score 11/02/17 1755 0     Pain Loc --      Pain Edu? --      Excl. in [place]? --     Constitutional: Alert and oriented. Well appearing and in no acute distress. Eyes: Conjunctivae are normal Head: Atraumatic HEENT: No congestion/rhinnorhea. Mucous membranes are moist.  Oropharynx non-erythematous Neck:   Nontender with no meningismus, no masses, no stridor Cardiovascular: Normal rate, regular rhythm. Grossly normal heart sounds.  Good peripheral circulation. Respiratory: Normal respiratory effort.  No retractions. Lungs CTAB. Abdominal: Soft and nontender. No distention. No guarding no rebound Back:  There is no focal tenderness or step off.  there is no midline tenderness there are no lesions noted. there is no CVA tenderness Rectal exam, guaiac positive brown stool no active bleeding at this time Musculoskeletal: No lower extremity tenderness, no upper extremity tenderness. No joint effusions, no DVT signs strong distal pulses no edema Neurologic:  Normal speech and language. No gross focal  neurologic deficits are appreciated.  Skin:  Skin is warm, dry and intact. No rash noted. Psychiatric: Mood and affect are normal. Speech and behavior are normal.  ____________________________________________   LABS (all labs ordered are listed, but only abnormal results are displayed)  Labs Reviewed  COMPREHENSIVE METABOLIC PANEL - Abnormal; Notable for the following components:      Result Value   Chloride 94 (*)    CO2 41 (*)    Glucose, Bld 218 (*)    Creatinine, Ser 0.59 (*)    Calcium 8.8 (*)    Total Protein 6.4 (*)    All other components within normal limits  CBC - Abnormal; Notable for the following components:   RBC 3.18 (*)    Hemoglobin 10.6 (*)    HCT 31.8 (*)    RDW 15.0 (*)    Platelets 79 (*)  All other components within normal limits  PROTIME-INR  APTT  TYPE AND SCREEN    Pertinent labs  results that were available during my care of the patient were reviewed by me and considered in my medical decision making (see chart for details). ____________________________________________  EKG  I personally interpreted any EKGs ordered by me or triage  ____________________________________________  RADIOLOGY  Pertinent labs & imaging results that were available during my care of the patient were reviewed by me and considered in my medical decision making (see chart for details). If possible, patient and/or family made aware of any abnormal findings.  No results found. ____________________________________________    PROCEDURES  Procedure(s) performed: None  Procedures  Critical Care performed: None  ____________________________________________   INITIAL IMPRESSION / ASSESSMENT AND PLAN / ED COURSE  Pertinent labs & imaging results that were available during my care of the patient were reviewed by me and considered in my medical decision making (see chart for details).  Patient with platelet count of 79 which usually is not dangerous, also  unfortunately on aspirin with some rectal bleeding.  Hemoglobin is reassuring at 10.6, vital signs are stable at this time, does not need emergent transfusion, but will require admission for further observation, thrombus cytopenia is noted.    ____________________________________________   FINAL CLINICAL IMPRESSION(S) / ED DIAGNOSES  Final diagnoses:  Rectal bleeding      This chart was dictated using voice recognition software.  Despite best efforts to proofread,  errors can occur which can change meaning.      Schuyler Amor, MD 11/02/17 2012    Schuyler Amor, MD 11/02/17 2017

## 2017-11-02 NOTE — ED Triage Notes (Addendum)
Pt arrived via POV with family reports he thinks he is having a flare up of diverticulitis.  Pt states he has had flare up about 4 years ago.    Pt has had about 4-5 bowel movements today that have been loose.  Pt states the stools have had bright red blood present as well as black in appearance x1.   Pt denies any pain.

## 2017-11-02 NOTE — H&P (Signed)
[facility] [place] NAME: Patrick Ayala    MR#:  [id number]  DATE OF BIRTH:  [birth year]  DATE OF ADMISSION:  11/02/2017  PRIMARY CARE PHYSICIAN: Guadalupe Maple, MD   REQUESTING/REFERRING PHYSICIAN: Burlene Arnt, MD  CHIEF COMPLAINT:   Chief Complaint  Patient presents with  . Rectal Bleeding    HISTORY OF PRESENT ILLNESS:  Patrick Ayala  is a 77 y.o. male who presents with 1 day of dark tarry stools mixed with some blood.  Patient states that he has had 4-5 episodes today of very dark stools mixed with some bright blood.  He states that this is happened to him before when he had diverticulitis, and he had a colonoscopy done at that time which showed a bleed.  His hemoglobin is dropped one-point from prior values last month.  He denies any abdominal pain, nausea or vomiting.  Hospitalist were called for admission  PAST MEDICAL HISTORY:   Past Medical History:  Diagnosis Date  . Carboxyhemoglobinemia   . COPD (chronic obstructive pulmonary disease) (HCC)    on 2.5L chronic home o2  . Polycythemia, secondary 10/20/2014  . Thrombocytopenia ([place])      PAST SURGICAL HISTORY:   Past Surgical History:  Procedure Laterality Date  . APPENDECTOMY    . EYE SURGERY Right    cataract  . HERNIA REPAIR    . polycythemia       SOCIAL HISTORY:   Social History   Tobacco Use  . Smoking status: Current Every Day Smoker    Packs/day: 0.50    Types: Cigarettes  . Smokeless tobacco: Never Used  . Tobacco comment: Smoking History 1(1)Packs per day  Substance Use Topics  . Alcohol use: Yes    Alcohol/week: 8.4 oz    Types: 14 Cans of beer per week    Comment: 2 cans of beer a night     FAMILY HISTORY:   Family History  Problem Relation Age of Onset  . Cancer Mother   . Cancer Sister   . Cancer Brother      DRUG ALLERGIES:   Allergies  Allergen Reactions  . No Known Allergies     MEDICATIONS AT HOME:   Prior to  Admission medications   Medication Sig Start Date End Date Taking? Authorizing Provider  albuterol (PROVENTIL HFA;VENTOLIN HFA) 108 (90 Base) MCG/ACT inhaler Inhale 1-2 puffs into the lungs every 6 (six) hours as needed for wheezing or shortness of breath. 09/19/16   Guadalupe Maple, MD  amLODipine (NORVASC) 5 MG tablet Take 1 tablet (5 mg total) by mouth daily. 03/27/17   Guadalupe Maple, MD  aspirin 81 MG tablet ASA 81 mg po once a day (start taking it from 08/02/2013)   Quantity: 0;  Refills: 0  Active    [provider]  atorvastatin (LIPITOR) 20 MG tablet Take 1 tablet (20 mg total) daily by mouth. 02/16/17   Guadalupe Maple, MD  benazepril (LOTENSIN) 40 MG tablet Take 1 tablet (40 mg total) by mouth daily. 03/28/17   Guadalupe Maple, MD  budesonide-formoterol (SYMBICORT) 160-4.5 MCG/ACT inhaler Inhale 2 puffs into the lungs every morning. INHALE 2 TIMES DAILY 09/19/16   Guadalupe Maple, MD  predniSONE (DELTASONE) 10 MG tablet Take 1 tablet (10 mg total) by mouth daily. Label  & dispense according to the schedule below.  6 tablets day one, then 5 table day 2, then 4 tablets day 3, then 3  tablets day 4, 2 tablets day 5, then 1 tablet day 6, then stop 09/23/17   Saundra Shelling, MD  SYMBICORT 160-4.5 MCG/ACT inhaler INHALE 2 PUFFS INTO THE LUNGS EVERY MORNING. INHALE 2 TIMES DAILY 01/12/17   Volney American, PA-C    REVIEW OF SYSTEMS:  Review of Systems  Constitutional: Negative for chills, fever, malaise/fatigue and weight loss.  HENT: Negative for ear pain, hearing loss and tinnitus.   Eyes: Negative for blurred vision, double vision, pain and redness.  Respiratory: Negative for cough, hemoptysis and shortness of breath.   Cardiovascular: Negative for chest pain, palpitations, orthopnea and leg swelling.  Gastrointestinal: Positive for blood in stool and melena. Negative for abdominal pain, constipation, diarrhea, nausea and vomiting.  Genitourinary: Negative for dysuria,  frequency and hematuria.  Musculoskeletal: Negative for back pain, joint pain and neck pain.  Skin:       No acne, rash, or lesions  Neurological: Negative for dizziness, tremors, focal weakness and weakness.  Endo/Heme/Allergies: Negative for polydipsia. Does not bruise/bleed easily.  Psychiatric/Behavioral: Negative for depression. The patient is not nervous/anxious and does not have insomnia.      VITAL SIGNS:   Vitals:   11/02/17 1754  BP: (!) 121/55  Pulse: (!) 115  Resp: 18  Temp: 98.1 F (36.7 C)  TempSrc: Oral  SpO2: 95%  Weight: 52.2 kg (115 lb)  Height: _0  (1.753 m)   Wt Readings from Last 3 Encounters:  11/02/17 52.2 kg (115 lb)  09/21/17 53.1 kg (117 lb 1 oz)  09/21/17 53.3 kg (117 lb 6.4 oz)    PHYSICAL EXAMINATION:  Physical Exam  Vitals reviewed. Constitutional: He is oriented to person, place, and time. He appears well-developed and well-nourished. No distress.  HENT:  Head: Normocephalic and atraumatic.  Mouth/Throat: Oropharynx is clear and moist.  Eyes: Pupils are equal, round, and reactive to light. Conjunctivae and EOM are normal. No scleral icterus.  Neck: Normal range of motion. Neck supple. No JVD present. No thyromegaly present.  Cardiovascular: Normal rate, regular rhythm and intact distal pulses. Exam reveals no gallop and no friction rub.  No murmur heard. Respiratory: Effort normal and breath sounds normal. No respiratory distress. He has no wheezes. He has no rales.  GI: Soft. Bowel sounds are normal. He exhibits no distension. There is no tenderness.  Musculoskeletal: Normal range of motion. He exhibits no edema.  No arthritis, no gout  Lymphadenopathy:    He has no cervical adenopathy.  Neurological: He is alert and oriented to person, place, and time. No cranial nerve deficit.  No dysarthria, no aphasia  Skin: Skin is warm and dry. No rash noted. No erythema.  Psychiatric: He has a normal mood and affect. His behavior is normal.  Judgment and thought content normal.    LABORATORY PANEL:   CBC Recent Labs  Lab 11/02/17 1759  WBC 7.9  HGB 10.6*  HCT 31.8*  PLT 79*   ------------------------------------------------------------------------------------------------------------------  Chemistries  Recent Labs  Lab 11/02/17 1759  NA 140  K 3.9  CL 94*  CO2 41*  GLUCOSE 218*  BUN 15  CREATININE 0.59*  CALCIUM 8.8*  AST 25  ALT 18  ALKPHOS 46  BILITOT 0.7   ------------------------------------------------------------------------------------------------------------------  Cardiac Enzymes No results for input(s): TROPONINI in the last 168 hours. ------------------------------------------------------------------------------------------------------------------  RADIOLOGY:  No results found.  EKG:   Orders placed or performed during the hospital encounter of 09/21/17  . ED EKG  . ED EKG    IMPRESSION  AND PLAN:  Principal Problem:   GI bleed -seems like his bleed may be from lower GI tract, though it is not possible to say given mixed picture of his symptoms.  We will start a PPI drip and get a GI consult Active Problems:   COPD (chronic obstructive pulmonary disease) (HCC) -home dose inhalers   Thrombocytopenia (HCC) -this is chronic, patient is concurrent diagnoses of secondary polycythemia as well as carboxyhemoglobinemia area holding all anticoagulation for the same and also due to his GI bleed as above   Hyperlipidemia -Home dose antilipid   BPH (benign prostatic hyperplasia) -continue home meds  Chart review performed and case discussed with ED provider. Labs, imaging and/or ECG reviewed by provider and discussed with patient/family. Management plans discussed with the patient and/or family.  DVT PROPHYLAXIS: Mechanical only  GI PROPHYLAXIS: PPI  ADMISSION STATUS: Inpatient  CODE STATUS: Full Code Status History    Date Active Date Inactive Code Status Order ID Comments User Context    09/21/2017 2058 09/23/2017 1916 Full Code [id number]  Gladstone Lighter, MD Inpatient      TOTAL TIME TAKING CARE OF THIS PATIENT: 45 minutes.   Andray Assefa [place] 11/02/2017, 8:37 PM  [facility]  Office  [phone]  CC: Primary care physician; Guadalupe Maple, MD  Note:  This document was prepared using Dragon voice recognition software and may include unintentional dictation errors.

## 2017-11-02 NOTE — ED Notes (Signed)
Pt's family updated on delay for room assignment. Pt sleeping in room at this time in NAD.

## 2017-11-03 ENCOUNTER — Inpatient Hospital Stay: Payer: Medicare HMO

## 2017-11-03 DIAGNOSIS — K921 Melena: Principal | ICD-10-CM

## 2017-11-03 LAB — BASIC METABOLIC PANEL
Anion gap: 4 — ABNORMAL LOW (ref 5–15)
BUN: 14 mg/dL (ref 8–23)
CALCIUM: 8.4 mg/dL — AB (ref 8.9–10.3)
CO2: 42 mmol/L — ABNORMAL HIGH (ref 22–32)
Chloride: 98 mmol/L (ref 98–111)
Creatinine, Ser: 0.58 mg/dL — ABNORMAL LOW (ref 0.61–1.24)
Glucose, Bld: 87 mg/dL (ref 70–99)
Potassium: 3.7 mmol/L (ref 3.5–5.1)
SODIUM: 144 mmol/L (ref 135–145)

## 2017-11-03 LAB — CBC
HEMATOCRIT: 28.8 % — AB (ref 40.0–52.0)
HEMOGLOBIN: 9.6 g/dL — AB (ref 13.0–18.0)
MCH: 33.4 pg (ref 26.0–34.0)
MCHC: 33.2 g/dL (ref 32.0–36.0)
MCV: 100.7 fL — ABNORMAL HIGH (ref 80.0–100.0)
Platelets: 62 10*3/uL — ABNORMAL LOW (ref 150–440)
RBC: 2.87 MIL/uL — ABNORMAL LOW (ref 4.40–5.90)
RDW: 14.9 % — AB (ref 11.5–14.5)
WBC: 7.1 10*3/uL (ref 3.8–10.6)

## 2017-11-03 LAB — HEMOGLOBIN AND HEMATOCRIT, BLOOD
HCT: 29.4 % — ABNORMAL LOW (ref 40.0–52.0)
HCT: 27.2 % — ABNORMAL LOW (ref 40.0–52.0)
Hemoglobin: 9.7 g/dL — ABNORMAL LOW (ref 13.0–18.0)
Hemoglobin: 9.1 g/dL — ABNORMAL LOW (ref 13.0–18.0)

## 2017-11-03 MED ORDER — OXYCODONE HCL 5 MG PO TABS: 5.0000 mg | ORAL_TABLET | ORAL | Status: DC | PRN

## 2017-11-03 MED ORDER — ACETAMINOPHEN 650 MG RE SUPP: 650.0000 mg | Freq: Four times a day (QID) | RECTAL | Status: DC | PRN

## 2017-11-03 MED ORDER — ONDANSETRON HCL 4 MG PO TABS: 4.0000 mg | ORAL_TABLET | Freq: Four times a day (QID) | ORAL | Status: DC | PRN

## 2017-11-03 MED ORDER — ATORVASTATIN CALCIUM 20 MG PO TABS
20.0000 mg | ORAL_TABLET | Freq: Every day | ORAL | Status: DC
  Administered 2017-11-03 – 2017-11-07 (×4): 20 mg via ORAL
  Filled 2017-11-03 (×5): qty 1

## 2017-11-03 MED ORDER — ACETAMINOPHEN 325 MG PO TABS: 650.0000 mg | ORAL_TABLET | Freq: Four times a day (QID) | ORAL | Status: DC | PRN

## 2017-11-03 MED ORDER — ONDANSETRON HCL 4 MG/2ML IJ SOLN: 4.0000 mg | Freq: Four times a day (QID) | INTRAMUSCULAR | Status: DC | PRN

## 2017-11-03 MED ORDER — MOMETASONE FURO-FORMOTEROL FUM 200-5 MCG/ACT IN AERO
2.0000 | INHALATION_SPRAY | Freq: Two times a day (BID) | RESPIRATORY_TRACT | Status: DC
  Administered 2017-11-03 – 2017-11-07 (×10): 2 via RESPIRATORY_TRACT
  Filled 2017-11-03: qty 8.8

## 2017-11-03 NOTE — Progress Notes (Signed)
[facility] [place] NAME: Patrick Ayala    MR#:  [id number]  DATE OF BIRTH:  1940/05/02  SUBJECTIVE:  CHIEF COMPLAINT: Is resting comfortably.  Denies any abdominal pain.  Had history of diverticular bleed in the past.  Still noticing some rectal bleed  REVIEW OF SYSTEMS:  CONSTITUTIONAL: No fever, fatigue or weakness.  EYES: No blurred or double vision.  EARS, NOSE, AND THROAT: No tinnitus or ear pain.  RESPIRATORY: No cough, shortness of breath, wheezing or hemoptysis.  CARDIOVASCULAR: No chest pain, orthopnea, edema.  GASTROINTESTINAL: No nausea, vomiting, diarrhea or abdominal pain.  GENITOURINARY: No dysuria, hematuria.  ENDOCRINE: No polyuria, nocturia,  HEMATOLOGY: No anemia, easy bruising or bleeding SKIN: No rash or lesion. MUSCULOSKELETAL: No joint pain or arthritis.   NEUROLOGIC: No tingling, numbness, weakness.  PSYCHIATRY: No anxiety or depression.   DRUG ALLERGIES:  No Known Allergies  VITALS:  Blood pressure (!) 102/43, pulse 88, temperature 98.3 F (36.8 C), resp. rate 18, height _0  (1.753 m), weight 51.1 kg (112 lb 9.6 oz), SpO2 99 %.  PHYSICAL EXAMINATION:  GENERAL:  77 y.o.-year-old patient lying in the bed with no acute distress.  EYES: Pupils equal, round, reactive to light and accommodation. No scleral icterus. Extraocular muscles intact.  HEENT: Head atraumatic, normocephalic. Oropharynx and nasopharynx clear.  NECK:  Supple, no jugular venous distention. No thyroid enlargement, no tenderness.  LUNGS: Normal breath sounds bilaterally, no wheezing, rales,rhonchi or crepitation. No use of accessory muscles of respiration.  CARDIOVASCULAR: S1, S2 normal. No murmurs, rubs, or gallops.  ABDOMEN: Soft, nontender, nondistended. Bowel sounds present. No organomegaly or mass.  EXTREMITIES: No pedal edema, cyanosis, or clubbing.  NEUROLOGIC: Cranial nerves II through XII are intact. Muscle strength 5/5 in all  extremities. Sensation intact. Gait not checked.  PSYCHIATRIC: The patient is alert and oriented x 3.  SKIN: No obvious rash, lesion, or ulcer.    LABORATORY PANEL:   CBC Recent Labs  Lab 11/03/17 0451  WBC 7.1  HGB 9.6*  HCT 28.8*  PLT 62*   ------------------------------------------------------------------------------------------------------------------  Chemistries  Recent Labs  Lab 11/02/17 1759 11/03/17 0451  NA 140 144  K 3.9 3.7  CL 94* 98  CO2 41* 42*  GLUCOSE 218* 87  BUN 15 14  CREATININE 0.59* 0.58*  CALCIUM 8.8* 8.4*  AST 25  --   ALT 18  --   ALKPHOS 46  --   BILITOT 0.7  --    ------------------------------------------------------------------------------------------------------------------  Cardiac Enzymes No results for input(s): TROPONINI in the last 168 hours. ------------------------------------------------------------------------------------------------------------------  RADIOLOGY:  No results found.  EKG:   Orders placed or performed during the hospital encounter of 09/21/17  . ED EKG  . ED EKG    ASSESSMENT AND PLAN:     GI bleed -seems like his bleed may be from lower GI tract, patient had a history of diverticular bleed in the past Continue PPI drip  GI consult- pending Monitor hemoglobin and hematocrit closely Pain management as needed  currently patient is denying any abdominal pain Previous colonoscopy several years worse normal     COPD (chronic obstructive pulmonary disease) (HCC) - no exacerbation.  Breathing treatments as needed     Thrombocytopenia ([place]) -this is chronic, patient is concurrent diagnoses of secondary polycythemia as well as carboxyhemoglobinemia area holding all anticoagulation for the same and also due to his GI bleed as above    Hyperlipidemia -continue Lipitor    BPH (  benign prostatic hyperplasia) -continue home meds     All the records are reviewed and case discussed with Care  Management/Social Workerr. Management plans discussed with the patient, family and they are in agreement.  CODE STATUS: fc   TOTAL TIME TAKING CARE OF THIS PATIENT: 36  minutes.   POSSIBLE D/C IN 2-3  DAYS, DEPENDING ON CLINICAL CONDITION.  Note: This dictation was prepared with Dragon dictation along with smaller phrase technology. Any transcriptional errors that result from this process are unintentional.   Nicholes Mango M.D on 11/03/2017 at 2:35 PM  Between 7am to 6pm - Pager - [phone] After 6pm go to [email or web address] - password [email or web address] [facility]  Office  [phone]  CC: Primary care physician; Guadalupe Maple, MD

## 2017-11-03 NOTE — Consult Note (Signed)
Cephas Darby, MD [address]  [place]  [place],  [place] [address]  Main: [phone]  Fax: [phone] Pager: [phone]   Consultation  Referring Provider:     No ref. provider found Primary Care Physician:  Guadalupe Maple, MD Primary Gastroenterologist:  Dr. Sherri Sear         Reason for Consultation:     Worsening anemia  Date of Admission:  11/02/2017 Date of Consultation:  11/03/2017         HPI:   Patrick Ayala is a 77 y.o. African-American male with history of COPD, on home O2, chronic tobacco use, secondary polycythemia, therapeutic phlebotomies in the past, chronic unexplained thrombocytopenia admitted with generalized weakness, worsening shortness of breath, 2 days history of dark stools. He denies weight loss, abdominal pain, nausea and vomiting. He denies NSAID use. He drinks 2 beers at night daily. His wife and his daughter are bedside during my interview with him. Patient is followed by Dr. Nolon Stalls at [place] for thrombocytopenia and polycythemia No known chronic liver disease His hemoglobin on admission is 9.6, MCV 100.7. He is started on pantoprazole drip. He does have new onset of macrocytosis. His B12 has been normal, parietal cell antibodies and intrinsic factor antibodies are negative. His last normal hemoglobin was 14.2 on 09/21/2017. Since then, it has been gradually declining.   NSAIDs: none  Antiplts/Anticoagulants/Anti thrombotics: none  GI Procedures:  Colonoscopy 2015 Diagnosis:  Part A: SIGMOID COLON COLD SNARE:  - HYPERPLASTIC POLYP.  - NEGATIVE FOR DYSPLASIA AND MALIGNANCY.  .  Part B: CECUM POLYP HOT AND COLD SNARE:  - TUBULAR ADENOMA.  - NEGATIVE FOR HIGH GRADE DYSPLASIA AND MALIGNANCY.  .  Part C: SIGMOID POLYP COLD SNARE:  - TUBULAR ADENOMA, 2 FRAGMENTS, NEGATIVE FOR HIGH GRADE DYSPLASIA  AND MALIGNANCY.  - HYPERPLASTIC POLYP, 1 FRAGMENT, NEGATIVE FOR DYSPLASIA AND  MALIGNANCY.   Past Medical  History:  Diagnosis Date  . Carboxyhemoglobinemia   . COPD (chronic obstructive pulmonary disease) (HCC)    on 2.5L chronic home o2  . Polycythemia, secondary 10/20/2014  . Thrombocytopenia ([place])     Past Surgical History:  Procedure Laterality Date  . APPENDECTOMY    . EYE SURGERY Right    cataract  . HERNIA REPAIR    . polycythemia      Prior to Admission medications   Medication Sig Start Date End Date Taking? Authorizing Provider  albuterol (PROVENTIL HFA;VENTOLIN HFA) 108 (90 Base) MCG/ACT inhaler Inhale 1-2 puffs into the lungs every 6 (six) hours as needed for wheezing or shortness of breath. 09/19/16  Yes Crissman, Jeannette How, MD  amLODipine (NORVASC) 5 MG tablet Take 1 tablet (5 mg total) by mouth daily. 03/27/17  Yes Guadalupe Maple, MD  aspirin 81 MG tablet ASA 81 mg po once a day (start taking it from 08/02/2013)   Quantity: 0;  Refills: 0  Active   Yes [provider]  atorvastatin (LIPITOR) 20 MG tablet Take 1 tablet (20 mg total) daily by mouth. 02/16/17  Yes Crissman, Jeannette How, MD  benazepril (LOTENSIN) 40 MG tablet Take 1 tablet (40 mg total) by mouth daily. 03/28/17  Yes Crissman, Jeannette How, MD  SYMBICORT 160-4.5 MCG/ACT inhaler INHALE 2 PUFFS INTO THE LUNGS EVERY MORNING. INHALE 2 TIMES DAILY 01/12/17  Yes Volney American, [place]    Family History  Problem Relation Age of Onset  . Cancer Mother   . Cancer Sister   .  Cancer Brother      Social History   Tobacco Use  . Smoking status: Current Every Day Smoker    Packs/day: 0.50    Types: Cigarettes  . Smokeless tobacco: Never Used  . Tobacco comment: Smoking History 1(1)Packs per day  Substance Use Topics  . Alcohol use: Yes    Alcohol/week: 8.4 oz    Types: 14 Cans of beer per week    Comment: 2 cans of beer a night  . Drug use: No    Allergies as of 11/02/2017  . (No Known Allergies)    Review of Systems:    All systems reviewed and negative except where noted in HPI.   Physical Exam:    Vital signs in last 24 hours: Temp:  [98.1 F (36.7 C)-98.4 F (36.9 C)] 98.4 F (36.9 C) (07/27 1606) Pulse Rate:  [81-115] 86 (07/27 1606) Resp:  [16-20] 19 (07/27 1606) BP: (100-121)/(43-87) 103/52 (07/27 1606) SpO2:  [95 %-99 %] 97 % (07/27 1606) Weight:  [112 lb 9.6 oz (51.1 kg)-115 lb (52.2 kg)] 112 lb 9.6 oz (51.1 kg) (07/27 0006) Last BM Date: 11/02/17 General:   Pleasant, cooperative in NAD, frail-appearing, malnourished Head:  Normocephalic and atraumatic, bitemporal wasting. Eyes:   No icterus.   Conjunctiva pink. PERRLA. Ears:  Normal auditory acuity. Neck:  Supple; no masses or thyroidomegaly Lungs: Respirations even and unlabored. Lungs clear to auscultation bilaterally.   No wheezes, crackles, or rhonchi.  Heart:  Regular rate and rhythm;  Without murmur, clicks, rubs or gallops Abdomen:  Soft, nondistended, scaphoid, nontender. Normal bowel sounds. No appreciable masses or hepatomegaly.  No rebound or guarding.  Rectal:  Not performed. Msk:  Symmetrical without gross deformities.  Strength generalized weakness  Extremities:  Without edema, cyanosis or clubbing. Neurologic:  Alert and oriented x3;  grossly normal neurologically. Skin:  Intact without significant lesions or rashes. Psych:  Alert and cooperative. Normal affect.  LAB RESULTS: CBC Latest Ref Rng & Units 11/03/2017 11/03/2017 11/02/2017  WBC 3.8 - 10.6 K/uL - 7.1 7.9  Hemoglobin 13.0 - 18.0 g/dL 9.7(L) 9.6(L) 10.6(L)  Hematocrit 40.0 - 52.0 % 29.4(L) 28.8(L) 31.8(L)  Platelets 150 - 440 K/uL - 62(L) 79(L)    BMET BMP Latest Ref Rng & Units 11/03/2017 11/02/2017 09/22/2017  Glucose 70 - 99 mg/dL 87 218(H) 119(H)  BUN 8 - 23 mg/dL 14 15 23(H)  Creatinine 0.61 - 1.24 mg/dL 0.58(L) 0.59(L) 0.60(L)  BUN/Creat Ratio 10 - 24 - - -  Sodium 135 - 145 mmol/L 144 140 135  Potassium 3.5 - 5.1 mmol/L 3.7 3.9 4.9  Chloride 98 - 111 mmol/L 98 94(L) 89(L)  CO2 22 - 32 mmol/L 42(H) 41(H) 38(H)  Calcium 8.9 - 10.3  mg/dL 8.4(L) 8.8(L) 8.9    LFT Hepatic Function Latest Ref Rng & Units 11/02/2017 09/21/2017 09/06/2017  Total Protein 6.5 - 8.1 g/dL 6.4(L) 7.9 7.4  Albumin 3.5 - 5.0 g/dL 4.2 4.0 4.5  AST 15 - 41 U/L _0 ALT 0 - 44 U/L _1 Alk Phosphatase 38 - 126 U/L 46 53 48  Total Bilirubin 0.3 - 1.2 mg/dL 0.7 0.9 1.2  Bilirubin, Direct 0.1 - 0.5 mg/dL - - -     STUDIES: No results found.    Impression / Plan:   Patrick Ayala is a 77 y.o. African-American male with chronic tobacco use, COPD, home O2, secondary polycythemia, unexplained thrombocytopenia who presents with worsening anemia and 2 days history  of black stools. Peptic ulcer disease or erosive esophagitis or malignancy or small bowel lesions  - Recommend EGD and colonoscopy for further evaluation - Continue pantoprazole drip - Monitor CBC closely - Clear liquid diet only - Nothing by mouth past midnight - bowel prep ordered  I have discussed alternative options, risks & benefits,  which include, but are not limited to, bleeding, infection, perforation,respiratory complication & drug reaction.  The patient agrees with this plan & written consent will be obtained.    Thank you for involving me in the care of this patient. Will follow along with you     LOS: 1 day   Sherri Sear, MD  11/03/2017, 4:34 PM   Note: This dictation was prepared with Dragon dictation along with smaller phrase technology. Any transcriptional errors that result from this process are unintentional.

## 2017-11-03 NOTE — Anesthesia Preprocedure Evaluation (Addendum)
Anesthesia Evaluation  Patient identified by MRN, date of birth, ID band Patient awake    Reviewed: Allergy & Precautions, H&P , NPO status , Patient's Chart, lab work & pertinent test results, reviewed documented beta blocker date and time   History of Anesthesia Complications Negative for: history of anesthetic complications  Airway Mallampati: I  TM Distance: >3 FB Neck ROM: full    Dental  (+) Poor Dentition, Missing, Dental Advidsory Given   Pulmonary neg pulmonary ROS, shortness of breath, with exertion and Long-Term Oxygen Therapy, neg sleep apnea, pneumonia, resolved, COPD,  COPD inhaler and oxygen dependent, Recent URI , Residual Cough, Current Smoker,           Cardiovascular Exercise Tolerance: Good hypertension, (-) angina+ Peripheral Vascular Disease  (-) CAD, (-) Past MI, (-) Cardiac Stents and (-) CABG (-) dysrhythmias (-) Valvular Problems/Murmurs     Neuro/Psych negative neurological ROS  negative psych ROS   GI/Hepatic negative GI ROS, Neg liver ROS,   Endo/Other  negative endocrine ROS  Renal/GU negative Renal ROS  negative genitourinary   Musculoskeletal   Abdominal   Peds  Hematology negative hematology ROS (+)   Anesthesia Other Findings Past Medical History: No date: Carboxyhemoglobinemia No date: COPD (chronic obstructive pulmonary disease) (HCC)     Comment:  on 2.5L chronic home o2 10/20/2014: Polycythemia, secondary No date: Thrombocytopenia (HCC)   Reproductive/Obstetrics negative OB ROS                            Anesthesia Physical Anesthesia Plan  ASA: IV  Anesthesia Plan: General   Post-op Pain Management:    Induction: Intravenous  PONV Risk Score and Plan: 1 and Propofol infusion and TIVA  Airway Management Planned: Nasal Cannula  Additional Equipment:   Intra-op Plan:   Post-operative Plan:   Informed Consent: I have reviewed the patients  History and Physical, chart, labs and discussed the procedure including the risks, benefits and alternatives for the proposed anesthesia with the patient or authorized representative who has indicated his/her understanding and acceptance.   Dental Advisory Given  Plan Discussed with: Anesthesiologist, CRNA and Surgeon  Anesthesia Plan Comments:         Anesthesia Quick Evaluation

## 2017-11-04 ENCOUNTER — Encounter
Admission: EM | Disposition: A | Discharge status: Home / Self Care | Payer: Self-pay | Source: Home / Self Care | Attending: Internal Medicine

## 2017-11-04 ENCOUNTER — Inpatient Hospital Stay: Payer: Medicare HMO | Admitting: Anesthesiology

## 2017-11-04 ENCOUNTER — Encounter: Payer: Self-pay | Admitting: Anesthesiology

## 2017-11-04 HISTORY — PX: ESOPHAGOGASTRODUODENOSCOPY: SHX5428

## 2017-11-04 LAB — CBC
HCT: 27.6 % — ABNORMAL LOW (ref 40.0–52.0)
HEMOGLOBIN: 9.1 g/dL — AB (ref 13.0–18.0)
MCH: 33.1 pg (ref 26.0–34.0)
MCHC: 33.1 g/dL (ref 32.0–36.0)
MCV: 100.1 fL — AB (ref 80.0–100.0)
PLATELETS: 58 10*3/uL — AB (ref 150–440)
RBC: 2.76 MIL/uL — ABNORMAL LOW (ref 4.40–5.90)
RDW: 15 % — ABNORMAL HIGH (ref 11.5–14.5)
WBC: 5.6 10*3/uL (ref 3.8–10.6)

## 2017-11-04 LAB — VITAMIN B12: Vitamin B-12: 3555 pg/mL — ABNORMAL HIGH (ref 180–914)

## 2017-11-04 SURGERY — EGD (ESOPHAGOGASTRODUODENOSCOPY)
Anesthesia: General

## 2017-11-04 MED ORDER — LIDOCAINE 2% (20 MG/ML) 5 ML SYRINGE
INTRAMUSCULAR | Status: DC | PRN
  Administered 2017-11-04: 25 mg via INTRAVENOUS

## 2017-11-04 MED ORDER — PROPOFOL 500 MG/50ML IV EMUL
INTRAVENOUS | Status: DC | PRN
  Administered 2017-11-04: 100 ug/kg/min via INTRAVENOUS

## 2017-11-04 MED ORDER — PROPOFOL 10 MG/ML IV BOLUS
INTRAVENOUS | Status: DC | PRN
  Administered 2017-11-04: 50 mg via INTRAVENOUS
  Administered 2017-11-04 (×2): 20 mg via INTRAVENOUS

## 2017-11-04 MED ORDER — SODIUM CHLORIDE 0.9 % IV SOLN
INTRAVENOUS | Status: DC | PRN
  Administered 2017-11-04: 11:00:00 via INTRAVENOUS

## 2017-11-04 MED ORDER — PANTOPRAZOLE SODIUM 40 MG PO TBEC
40.0000 mg | DELAYED_RELEASE_TABLET | Freq: Every day | ORAL | Status: DC
  Administered 2017-11-06 – 2017-11-07 (×2): 40 mg via ORAL
  Filled 2017-11-04 (×3): qty 1

## 2017-11-04 MED ORDER — PEG 3350-KCL-NA BICARB-NACL 420 G PO SOLR
4000.0000 mL | Freq: Once | ORAL | Status: AC
  Administered 2017-11-04: 4000 mL via ORAL
  Filled 2017-11-04: qty 4000

## 2017-11-04 NOTE — Transfer of Care (Signed)
Immediate Anesthesia Transfer of Care Note  Patient: Patrick Ayala  Procedure(s) Performed: ESOPHAGOGASTRODUODENOSCOPY (EGD) (N/A )  Patient Location: Endoscopy Unit  Anesthesia Type:General  Level of Consciousness: awake  Airway & Oxygen Therapy: Patient Spontanous Breathing and Patient connected to nasal cannula oxygen  Post-op Assessment: Report given to RN and Post -op Vital signs reviewed and stable  Post vital signs: Reviewed  Last Vitals:  Vitals Value Taken Time  BP 89/42 11/04/2017 10:56 AM  Temp 36.2 C 11/04/2017 10:56 AM  Pulse 82 11/04/2017 10:57 AM  Resp 22 11/04/2017 10:57 AM  SpO2 96 % 11/04/2017 10:57 AM  Vitals shown include unvalidated device data.  Last Pain:  Vitals:   11/04/17 1056  TempSrc: Tympanic  PainSc: Asleep         Complications: No apparent anesthesia complications

## 2017-11-04 NOTE — Plan of Care (Signed)
  Problem: Education: Goal: Knowledge of General Education information will improve Description Including pain rating scale, medication(s)/side effects and non-pharmacologic comfort measures Outcome: Progressing   Problem: Health Behavior/Discharge Planning: Goal: Ability to manage health-related needs will improve Outcome: Progressing   Problem: Clinical Measurements: Goal: Diagnostic test results will improve Outcome: Progressing   Problem: Pain Managment: Goal: General experience of comfort will improve Outcome: Progressing

## 2017-11-04 NOTE — Progress Notes (Signed)
Handoff given to EGD STAFF. Tap water enema was given however patient still not clear. After looking at orders noticed no other bowel prep was ordered only the tap water enema. Per egd staff they will get in touch with dr. Marius Ditch to make aware that patient only got tap water enema for bowel prep.

## 2017-11-04 NOTE — Anesthesia Post-op Follow-up Note (Signed)
Anesthesia QCDR form completed.        

## 2017-11-04 NOTE — Progress Notes (Signed)
Tap water enema given, return liquid remains brown with red tinge and clots. Will repeat until clear per mds order. Patient tolerated well

## 2017-11-04 NOTE — Progress Notes (Signed)
[place] [facility] [place] NAME: Patrick Ayala    MR#:  [id number]  DATE OF BIRTH:  [birth year]  SUBJECTIVE:  CHIEF COMPLAINT: Is resting comfortably.  Denies any abdominal pain.  Had history of diverticular bleed in the past.  Still noticing some rectal bleed  REVIEW OF SYSTEMS:  CONSTITUTIONAL: No fever, fatigue or weakness.  EYES: No blurred or double vision.  EARS, NOSE, AND THROAT: No tinnitus or ear pain.  RESPIRATORY: No cough, shortness of breath, wheezing or hemoptysis.  CARDIOVASCULAR: No chest pain, orthopnea, edema.  GASTROINTESTINAL: No nausea, vomiting, diarrhea or abdominal pain.  GENITOURINARY: No dysuria, hematuria.  ENDOCRINE: No polyuria, nocturia,  HEMATOLOGY: No anemia, easy bruising or bleeding SKIN: No rash or lesion. MUSCULOSKELETAL: No joint pain or arthritis.   NEUROLOGIC: No tingling, numbness, weakness.  PSYCHIATRY: No anxiety or depression.   DRUG ALLERGIES:  No Known Allergies  VITALS:  Blood pressure (!) 121/56, pulse 74, temperature 98.6 F (37 C), temperature source Oral, resp. rate 19, height _0  (1.753 m), weight 51.1 kg (112 lb 9.6 oz), SpO2 92 %.  PHYSICAL EXAMINATION:  GENERAL:  77 y.o.-year-old patient lying in the bed with no acute distress.  EYES: Pupils equal, round, reactive to light and accommodation. No scleral icterus. Extraocular muscles intact.  HEENT: Head atraumatic, normocephalic. Oropharynx and nasopharynx clear.  NECK:  Supple, no jugular venous distention. No thyroid enlargement, no tenderness.  LUNGS: Normal breath sounds bilaterally, no wheezing, rales,rhonchi or crepitation. No use of accessory muscles of respiration.  CARDIOVASCULAR: S1, S2 normal. No murmurs, rubs, or gallops.  ABDOMEN: Soft, nontender, nondistended. Bowel sounds present. No organomegaly or mass.  EXTREMITIES: No pedal edema, cyanosis, or clubbing.  NEUROLOGIC: Cranial nerves II through XII are intact.  Muscle strength 5/5 in all extremities. Sensation intact. Gait not checked.  PSYCHIATRIC: The patient is alert and oriented x 3.  SKIN: No obvious rash, lesion, or ulcer.    LABORATORY PANEL:   CBC Recent Labs  Lab 11/04/17 0223  WBC 5.6  HGB 9.1*  HCT 27.6*  PLT 58*   ------------------------------------------------------------------------------------------------------------------  Chemistries  Recent Labs  Lab 11/02/17 1759 11/03/17 0451  NA 140 144  K 3.9 3.7  CL 94* 98  CO2 41* 42*  GLUCOSE 218* 87  BUN 15 14  CREATININE 0.59* 0.58*  CALCIUM 8.8* 8.4*  AST 25  --   ALT 18  --   ALKPHOS 46  --   BILITOT 0.7  --    ------------------------------------------------------------------------------------------------------------------  Cardiac Enzymes No results for input(s): TROPONINI in the last 168 hours. ------------------------------------------------------------------------------------------------------------------  RADIOLOGY:  US Abdomen Complete  Result Date: 11/03/2017 CLINICAL DATA:  Weight loss EXAM: ABDOMEN ULTRASOUND COMPLETE COMPARISON:  CT 10/09/2012 FINDINGS: Gallbladder: No gallstones or wall thickening visualized. No sonographic Murphy sign noted by sonographer. Common bile duct: Diameter: Normal caliber, 2 mm Liver: No focal lesion identified. Within normal limits in parenchymal echogenicity. Portal vein is patent on color Doppler imaging with normal direction of blood flow towards the liver. IVC: No abnormality visualized. Pancreas: Visualized portion unremarkable. Spleen: Size and appearance within normal limits. Right Kidney: Length: 11.4 cm. Echogenicity within normal limits. No mass or hydronephrosis visualized. Left Kidney: Length: 11.1 cm. 3.4 cm lower pole parapelvic cyst. No hydronephrosis. Abdominal aorta: No aneurysm visualized. Other findings: None. IMPRESSION: No acute findings. Electronically Signed   By: Rolm Baptise M.D.   On: 11/03/2017  16:32    EKG:   Orders placed  or performed during the hospital encounter of 09/21/17  . ED EKG  . ED EKG    ASSESSMENT AND PLAN:     GI bleed -seems like his bleed may be from lower GI tract, patient had a history of diverticular bleed in the past  PPI drip changed to Protonix p.o. once daily  GI following.   EGD 7/28 Multiple dispersed, diminutive non-bleeding erosions were found in the gastric antrum. There were no stigmata of recent bleeding. Biopsies were taken with a cold forceps for Helicobacter pylori testing. Scheduled for colonoscopy tomorrow- Monitor hemoglobin and hematocrit closely.  Hemoglobin at 9.1 Pain management as needed  currently patient is denying any abdominal pain   COPD (chronic obstructive pulmonary disease) (HCC) - no exacerbation.  Breathing treatments as needed     Thrombocytopenia ([place]) -this is chronic, unclear etiology as per Dr. Kem Parkinson note.  Platelet count at 58,000 today  patient is concurrent diagnoses of secondary polycythemia as well as carboxyhemoglobinemia area holding all anticoagulation for the same and also due to his GI bleed as above Patient has some bruises on his extremities    Hyperlipidemia -continue Lipitor    BPH (benign prostatic hyperplasia) -continue home meds   DVT prophylaxis with SCDs given thrombocytopenia and GI bleed  All the records are reviewed and case discussed with Care Management/Social Workerr. Management plans discussed with the patient, family and they are in agreement.  CODE STATUS: fc   TOTAL TIME TAKING CARE OF THIS PATIENT: 36  minutes.   POSSIBLE D/C IN 2-3  DAYS, DEPENDING ON CLINICAL CONDITION.  Note: This dictation was prepared with Dragon dictation along with smaller phrase technology. Any transcriptional errors that result from this process are unintentional.   Nicholes Mango M.D on 11/04/2017 at 3:15 PM  Between 7am to 6pm - Pager - [phone] After 6pm go to [email or web address] - password  [email or web address]  Office  [phone]  CC: Primary care physician; Guadalupe Maple, MD

## 2017-11-04 NOTE — Op Note (Signed)
[facility] Gastroenterology Patient Name: Patrick Ayala Procedure Date: 11/04/2017 10:36 AM MRN: [id number] Account #: [id number] Date of Birth: [birth year] Admit Type: Inpatient Age: 77 Room: [facility] ENDO ROOM 4 Gender: Male Note Status: Finalized Procedure:            Upper GI endoscopy Indications:          Hematochezia Providers:            Lin Landsman MD, MD Medicines:            Monitored Anesthesia Care Complications:        No immediate complications. Estimated blood loss: None. Procedure:            Pre-Anesthesia Assessment:                       - Prior to the procedure, a History and Physical was                        performed, and patient medications and allergies were                        reviewed. The patient is competent. The risks and                        benefits of the procedure and the sedation options and                        risks were discussed with the patient. All questions                        were answered and informed consent was obtained.                        Patient identification and proposed procedure were                        verified by the physician, the nurse, the                        anesthesiologist, the anesthetist and the technician in                        the pre-procedure area in the procedure room in the                        endoscopy suite. Mental Status Examination: alert and                        oriented. Airway Examination: normal oropharyngeal                        airway and neck mobility. Respiratory Examination:                        clear to auscultation. CV Examination: normal.                        Prophylactic Antibiotics: The patient does not require  prophylactic antibiotics. Prior Anticoagulants: The                        patient has taken aspirin, last dose was 2 days prior                        to procedure. ASA Grade Assessment: IV - A patient with                     severe systemic disease that is a constant threat to                        life. After reviewing the risks and benefits, the                        patient was deemed in satisfactory condition to undergo                        the procedure. The anesthesia plan was to use monitored                        anesthesia care (MAC). Immediately prior to                        administration of medications, the patient was                        re-assessed for adequacy to receive sedatives. The                        heart rate, respiratory rate, oxygen saturations, blood                        pressure, adequacy of pulmonary ventilation, and                        response to care were monitored throughout the                        procedure. The physical status of the patient was                        re-assessed after the procedure.                       After obtaining informed consent, the endoscope was                        passed under direct vision. Throughout the procedure,                        the patient's blood pressure, pulse, and oxygen                        saturations were monitored continuously. The Endoscope                        was introduced through the mouth, and advanced to the  second part of duodenum. The upper GI endoscopy was                        accomplished without difficulty. The patient tolerated                        the procedure well. Findings:      The duodenal bulb and second portion of the duodenum were normal.      Multiple dispersed, diminutive non-bleeding erosions were found in the       gastric antrum. There were no stigmata of recent bleeding. Biopsies were       taken with a cold forceps for Helicobacter pylori testing.      The gastric fundus, gastric body, incisura, prepyloric region of the       stomach and pylorus were normal. Biopsies were taken with a cold forceps       for Helicobacter pylori  testing.      The gastroesophageal junction and examined esophagus were normal. Impression:           - Normal duodenal bulb and second portion of the                        duodenum.                       - Non-bleeding erosive gastropathy. Biopsied.                       - Normal gastric fundus, gastric body, incisura,                        prepyloric region of the stomach and pylorus. Biopsied.                       - Normal gastroesophageal junction and esophagus. Recommendation:       - Return patient to hospital ward for ongoing care.                       - Clear liquid diet today.                       - Continue present medications.                       - Await pathology results.                       - Perform a colonoscopy today. Procedure Code(s):    --- Professional ---                       [phone], Esophagogastroduodenoscopy, flexible, transoral;                        with biopsy, single or multiple Diagnosis Code(s):    --- Professional ---                       K31.89, Other diseases of stomach and duodenum                       K92.1, Melena (includes Hematochezia) CPT copyright 2017 American Medical Association. All rights reserved. The  codes documented in this report are preliminary and upon coder review may  be revised to meet current compliance requirements. Dr. Ulyess Mort Lin Landsman MD, MD 11/04/2017 10:54:14 AM This report has been signed electronically. Number of Addenda: 0 Note Initiated On: 11/04/2017 10:36 AM      [facility]

## 2017-11-04 NOTE — Plan of Care (Signed)
  Problem: Education: Goal: Knowledge of General Education information will improve Description Including pain rating scale, medication(s)/side effects and non-pharmacologic comfort measures Outcome: Progressing   Problem: Health Behavior/Discharge Planning: Goal: Ability to manage health-related needs will improve Outcome: Progressing

## 2017-11-04 NOTE — Anesthesia Postprocedure Evaluation (Signed)
Anesthesia Post Note  Patient: Patrick Ayala  Procedure(s) Performed: ESOPHAGOGASTRODUODENOSCOPY (EGD) (N/A )  Patient location during evaluation: Endoscopy Anesthesia Type: General Level of consciousness: awake and alert Pain management: pain level controlled Vital Signs Assessment: post-procedure vital signs reviewed and stable Respiratory status: spontaneous breathing, nonlabored ventilation, respiratory function stable and patient connected to nasal cannula oxygen Cardiovascular status: blood pressure returned to baseline and stable Postop Assessment: no apparent nausea or vomiting Anesthetic complications: no     Last Vitals:  Vitals:   11/04/17 1153 11/04/17 1513  BP: (!) 117/56 (!) 121/56  Pulse: 82 74  Resp: 19 19  Temp: 36.6 C 37 C  SpO2: 95% 92%    Last Pain:  Vitals:   11/04/17 1513  TempSrc: Oral  PainSc:                  Martha Clan

## 2017-11-05 ENCOUNTER — Inpatient Hospital Stay: Payer: Medicare HMO | Admitting: Anesthesiology

## 2017-11-05 ENCOUNTER — Encounter
Admission: EM | Disposition: A | Discharge status: Home / Self Care | Payer: Self-pay | Source: Home / Self Care | Attending: Internal Medicine

## 2017-11-05 ENCOUNTER — Encounter: Payer: Self-pay | Admitting: *Deleted

## 2017-11-05 DIAGNOSIS — K621 Rectal polyp: Secondary | ICD-10-CM

## 2017-11-05 DIAGNOSIS — D123 Benign neoplasm of transverse colon: Secondary | ICD-10-CM

## 2017-11-05 DIAGNOSIS — K625 Hemorrhage of anus and rectum: Secondary | ICD-10-CM

## 2017-11-05 DIAGNOSIS — K626 Ulcer of anus and rectum: Secondary | ICD-10-CM

## 2017-11-05 HISTORY — PX: COLONOSCOPY WITH PROPOFOL: SHX5780

## 2017-11-05 LAB — CBC
HEMATOCRIT: 26.8 % — AB (ref 40.0–52.0)
HEMOGLOBIN: 9 g/dL — AB (ref 13.0–18.0)
MCH: 33.1 pg (ref 26.0–34.0)
MCHC: 33.5 g/dL (ref 32.0–36.0)
MCV: 99 fL (ref 80.0–100.0)
Platelets: 55 10*3/uL — ABNORMAL LOW (ref 150–440)
RBC: 2.7 MIL/uL — ABNORMAL LOW (ref 4.40–5.90)
RDW: 14.6 % — AB (ref 11.5–14.5)
WBC: 6.3 10*3/uL (ref 3.8–10.6)

## 2017-11-05 LAB — HEPATITIS PANEL, ACUTE
HCV Ab: <0.1 s/co ratio (ref 0.0–0.9)
HEP B C IGM: NEGATIVE
HEP B S AG: NEGATIVE
Hep A IgM: NEGATIVE

## 2017-11-05 LAB — HIV ANTIBODY (ROUTINE TESTING W REFLEX): HIV SCREEN 4TH GENERATION: NONREACTIVE

## 2017-11-05 SURGERY — COLONOSCOPY WITH PROPOFOL
Anesthesia: General

## 2017-11-05 MED ORDER — PHENYLEPHRINE HCL 10 MG/ML IJ SOLN
INTRAMUSCULAR | Status: DC | PRN
  Administered 2017-11-05: 100 ug via INTRAVENOUS

## 2017-11-05 MED ORDER — SODIUM CHLORIDE 0.9 % IV SOLN
INTRAVENOUS | Status: DC | PRN
  Administered 2017-11-05: 16:00:00 via INTRAVENOUS

## 2017-11-05 MED ORDER — PROPOFOL 10 MG/ML IV BOLUS
INTRAVENOUS | Status: DC | PRN
  Administered 2017-11-05: 90 mg via INTRAVENOUS

## 2017-11-05 MED ORDER — LIDOCAINE HCL (CARDIAC) PF 100 MG/5ML IV SOSY
PREFILLED_SYRINGE | INTRAVENOUS | Status: DC | PRN
  Administered 2017-11-05: 40 mg via INTRAVENOUS

## 2017-11-05 MED ORDER — SODIUM CHLORIDE 0.9% FLUSH
3.0000 mL | Freq: Two times a day (BID) | INTRAVENOUS | Status: DC
  Administered 2017-11-05 – 2017-11-07 (×4): 3 mL via INTRAVENOUS

## 2017-11-05 MED ORDER — PROPOFOL 500 MG/50ML IV EMUL
INTRAVENOUS | Status: DC | PRN
  Administered 2017-11-05: 150 ug/kg/min via INTRAVENOUS

## 2017-11-05 MED ORDER — SODIUM CHLORIDE 0.9 % IV SOLN
INTRAVENOUS | Status: DC
  Administered 2017-11-05: 12:00:00 via INTRAVENOUS

## 2017-11-05 NOTE — Anesthesia Postprocedure Evaluation (Signed)
Anesthesia Post Note  Patient: Patrick Ayala  Procedure(s) Performed: COLONOSCOPY WITH PROPOFOL (N/A )  Patient location during evaluation: PACU Anesthesia Type: General Level of consciousness: awake and alert Pain management: pain level controlled Vital Signs Assessment: post-procedure vital signs reviewed and stable Respiratory status: spontaneous breathing, nonlabored ventilation, respiratory function stable and patient connected to nasal cannula oxygen Cardiovascular status: blood pressure returned to baseline and stable Postop Assessment: no apparent nausea or vomiting Anesthetic complications: no     Last Vitals:  Vitals:   11/05/17 1718 11/05/17 1800  BP: 106/64 131/62  Pulse:  80  Resp:  18  Temp:  36.5 C  SpO2:  96%    Last Pain:  Vitals:   11/05/17 1800  TempSrc: Oral  PainSc:                  Molli Barrows

## 2017-11-05 NOTE — Anesthesia Post-op Follow-up Note (Signed)
Anesthesia QCDR form completed.        

## 2017-11-05 NOTE — Anesthesia Procedure Notes (Signed)
Date/Time: 11/05/2017 3:52 PM Performed by: Doreen Salvage, CRNA Pre-anesthesia Checklist: Patient identified, Emergency Drugs available, Suction available and Patient being monitored Patient Re-evaluated:Patient Re-evaluated prior to induction Oxygen Delivery Method: Nasal cannula Induction Type: IV induction Dental Injury: Teeth and Oropharynx as per pre-operative assessment  Comments: Nasal cannula with etCO2 monitoring

## 2017-11-05 NOTE — Progress Notes (Signed)
Initial Nutrition Assessment  DOCUMENTATION CODES:   Underweight  INTERVENTION:   RD will order supplements when diet advanced  NUTRITION DIAGNOSIS:   Inadequate oral intake related to acute illness as evidenced by NPO status.  GOAL:   Patient will meet greater than or equal to 90% of their needs  MONITOR:   Diet advancement, Labs, Weight trends, Skin, I & O's  REASON FOR ASSESSMENT:   Other (Comment)(low BMI)    ASSESSMENT:   77 y/o male with h/o diverticulitis and COPD admitted with GIB Pt s/p EGD 7/28  Unable to see pt today as pt having colonoscopy at the time of RD visit. Pt is currently NPO for procedures today. Per chart, pt with 8lb(7%) wt loss over the past 2 months; this is not significant given the time frame but worth noting. Suspect pt with malnutrition r/t COPD and pt is underweight. RD will obtain nutrition related exam and history at follow up.   Medications reviewed and include: protonix  Labs reviewed: creat 0.58(L), Ca 8.4(L) Hgb 9.0(L), Hct 26.8(L)  Unable to complete Nutrition-Focused physical exam at this time.   Diet Order:   Diet Order           Diet NPO time specified Except for: Ice Chips, Sips with Meds  Diet effective midnight         EDUCATION NEEDS:   Not appropriate for education at this time  Skin:  Skin Assessment: Reviewed RN Assessment(ecchymosis )  Last BM:  7/29- type 7 s/p bowel prep   Height:   Ht Readings from Last 1 Encounters:  11/02/17 _0  (1.753 m)    Weight:   Wt Readings from Last 1 Encounters:  11/03/17 112 lb 9.6 oz (51.1 kg)    Ideal Body Weight:  72.7 kg  BMI:  Body mass index is 16.63 kg/m.  Estimated Nutritional Needs:   Kcal:  1700-1900kcal/day   Protein:  76-87g/day   Fluid:  >1.5L/day   Koleen Distance MS, RD, LDN Pager #- [phone] Office#- [phone] After Hours Pager: [phone]

## 2017-11-05 NOTE — Transfer of Care (Signed)
Immediate Anesthesia Transfer of Care Note  Patient: Patrick Ayala  Procedure(s) Performed: Procedure(s): COLONOSCOPY WITH PROPOFOL (N/A)  Patient Location: PACU and Endoscopy Unit  Anesthesia Type:General  Level of Consciousness: sedated  Airway & Oxygen Therapy: Patient Spontanous Breathing and Patient connected to nasal cannula oxygen  Post-op Assessment: Report given to RN and Post -op Vital signs reviewed and stable  Post vital signs: Reviewed and stable  Last Vitals:  Vitals:   11/05/17 1648 11/05/17 1649  BP: (!) 117/53   Pulse: 91   Resp: 17   Temp:  (!) 35.9 C  SpO2: 592%     Complications: No apparent anesthesia complications

## 2017-11-05 NOTE — Anesthesia Preprocedure Evaluation (Addendum)
Anesthesia Evaluation  Patient identified by MRN, date of birth, ID band Patient awake    Reviewed: Allergy & Precautions, NPO status , Patient's Chart, lab work & pertinent test results, reviewed documented beta blocker date and time   Airway Mallampati: II  TM Distance: >3 FB     Dental  (+) Chipped, Poor Dentition, Dental Advisory Given, Edentulous Upper, Missing   Pulmonary COPD, Current Smoker,           Cardiovascular hypertension, + Peripheral Vascular Disease       Neuro/Psych    GI/Hepatic   Endo/Other    Renal/GU      Musculoskeletal   Abdominal   Peds  Hematology   Anesthesia Other Findings Platelets 55,000. [place] aware.  Reproductive/Obstetrics                            Anesthesia Physical Anesthesia Plan  ASA: III  Anesthesia Plan: General   Post-op Pain Management:    Induction: Intravenous  PONV Risk Score and Plan:   Airway Management Planned:   Additional Equipment:   Intra-op Plan:   Post-operative Plan:   Informed Consent: I have reviewed the patients History and Physical, chart, labs and discussed the procedure including the risks, benefits and alternatives for the proposed anesthesia with the patient or authorized representative who has indicated his/her understanding and acceptance.     Plan Discussed with: CRNA  Anesthesia Plan Comments:         Anesthesia Quick Evaluation

## 2017-11-05 NOTE — Care Management Important Message (Signed)
Copy of signed IM left with patient in room.  

## 2017-11-05 NOTE — Op Note (Addendum)
[facility] Gastroenterology Patient Name: Patrick Ayala Procedure Date: 11/05/2017 3:53 PM MRN: [id number] Account #: [id number] Date of Birth: 02-Jun-1940 Admit Type: Outpatient Age: 77 Room: [facility] END[facility] ROOM 4 Gender: Male Note Status: Finalized Procedure:            Colonoscopy Indications:          Rectal bleeding Providers:            Khylen Riolo B. Bonna Gains MD, MD Referring MD:         Guadalupe Maple, MD (Referring MD) Medicines:            Monitored Anesthesia Care Complications:        No immediate complications. Procedure:            Pre-Anesthesia Assessment:                       - ASA Grade Assessment: III - A patient with severe                        systemic disease.                       - Prior to the procedure, a History and Physical was                        performed, and patient medications, allergies and                        sensitivities were reviewed. The patient's tolerance of                        previous anesthesia was reviewed.                       - The risks and benefits of the procedure and the                        sedation options and risks were discussed with the                        patient. All questions were answered and informed                        consent was obtained.                       - Patient identification and proposed procedure were                        verified prior to the procedure by the physician, the                        nurse, the anesthesiologist, the anesthetist and the                        technician. The procedure was verified in the procedure                        room.                       After obtaining  informed consent, the colonoscope was                        passed under direct vision. Throughout the procedure,                        the patient's blood pressure, pulse, and oxygen                        saturations were monitored continuously. The                         Colonoscope was introduced through the anus and                        advanced to the the cecum, identified by appendiceal                        orifice and ileocecal valve. The colonoscopy was                        performed with ease. The patient tolerated the                        procedure well. The quality of the bowel preparation                        was fair. Findings:      The perianal and digital rectal examinations were normal.      A 3 mm polyp was found in the hepatic flexure. The polyp was sessile.       The polyp was removed with a cold biopsy forceps. Resection and       retrieval were complete.      A 6 mm polyp was found in the hepatic flexure. The polyp was sessile.       The polyp was removed with a cold snare. Resection and retrieval were       complete. To prevent bleeding post-intervention, two hemostatic clips       were successfully placed. There was no bleeding at the end of the       procedure.      The exam was otherwise without abnormality.      A single (solitary) five mm ulcer was found in the rectum. No bleeding       was present. Stigmata of recent bleeding were present in the form of       erythema around the site but no fresh or old blood.      A 5 mm polyp was found in the rectum. The polyp was sessile. The polyp       was removed with a cold snare. Resection and retrieval were complete.      The rectum, sigmoid colon, descending colon, transverse colon, ascending       colon and cecum appeared normal otherwise.      Multiple small and large-mouthed diverticula were found in the sigmoid       colon and ascending colon. Impression:           - A single (solitary) ulcer or tear in the rectum. This  can lead to hematochezia but would not explain melena.                       - Preparation of the colon was fair. Yellow stool seen                        in the colon. No old or new blood seen.                       - One 3 mm  polyp at the hepatic flexure, removed with a                        cold biopsy forceps. Resected and retrieved.                       - One 6 mm polyp at the hepatic flexure, removed with a                        cold snare. Resected and retrieved. Clips were placed.                       - One 5 mm polyp in the rectum, removed with a cold                        snare. Resected and retrieved.                       - The examination was otherwise normal. Recommendation:       - Miralax daily to avoid constipation as that is the                        likely cause of the ulcer                       - If patient continues to have signs of GI bleeding                        small bowel capsule can be considered by inpatient GI                        team (Dr. Vicente Males this week).                       - Clear liquid diet.                       - Continue present medications.                       - Await pathology results.                       - The findings and recommendations were discussed with                        the patient.                       - Return to primary care physician as previously  scheduled.                       - Return patient to hospital ward for ongoing care.                       - Continue Serial CBCs and transfuse PRN                       - Follow up with Dr. Marius Ditch in GI clinic                       - This was not a screening exam for polyps as it was                        done for GI bleed. Screening for CRC will need to be                        discussed and scheduled in clinic at a later time.                       - High fiber diet. Procedure Code(s):    --- Professional ---                       [phone], Colonoscopy, flexible; with removal of tumor(s),                        polyp(s), or other lesion(s) by snare technique                       45380, 29, Colonoscopy, flexible; with biopsy, single                        or  multiple Diagnosis Code(s):    --- Professional ---                       D12.3, Benign neoplasm of transverse colon (hepatic                        flexure or splenic flexure)                       K62.1, Rectal polyp                       K62.6, Ulcer of anus and rectum                       K62.5, Hemorrhage of anus and rectum CPT copyright 2017 [employer]. All rights reserved. The codes documented in this report are preliminary and upon coder review may  be revised to meet current compliance requirements.  Vonda Antigua, MD Margretta Sidle B. Bonna Gains MD, MD 11/05/2017 4:56:18 PM This report has been signed electronically. Number of Addenda: 0 Note Initiated On: 11/05/2017 3:53 PM Scope Withdrawal Time: 0 hours 28 minutes 31 seconds  Total Procedure Duration: 0 hours 39 minutes 37 seconds  Estimated Blood Loss: Estimated blood loss: none.      [facility]

## 2017-11-06 DIAGNOSIS — E43 Unspecified severe protein-calorie malnutrition: Secondary | ICD-10-CM

## 2017-11-06 LAB — CBC
HEMATOCRIT: 24.2 % — AB (ref 40.0–52.0)
HEMOGLOBIN: 8.1 g/dL — AB (ref 13.0–18.0)
MCH: 33 pg (ref 26.0–34.0)
MCHC: 33.7 g/dL (ref 32.0–36.0)
MCV: 97.9 fL (ref 80.0–100.0)
Platelets: 49 10*3/uL — ABNORMAL LOW (ref 150–440)
RBC: 2.47 MIL/uL — ABNORMAL LOW (ref 4.40–5.90)
RDW: 14.7 % — AB (ref 11.5–14.5)
WBC: 4.9 10*3/uL (ref 3.8–10.6)

## 2017-11-06 LAB — SURGICAL PATHOLOGY

## 2017-11-06 MED ORDER — BOOST / RESOURCE BREEZE PO LIQD CUSTOM
1.0000 | Freq: Three times a day (TID) | ORAL | Status: DC
  Administered 2017-11-06 (×2): 1 via ORAL

## 2017-11-06 MED ORDER — VITAMIN C 500 MG PO TABS
250.0000 mg | ORAL_TABLET | Freq: Two times a day (BID) | ORAL | Status: DC
Start: 2017-11-06 — End: 2017-11-07
  Administered 2017-11-06 – 2017-11-07 (×3): 250 mg via ORAL
  Filled 2017-11-06 (×4): qty 0.5

## 2017-11-06 MED ORDER — ADULT MULTIVITAMIN W/MINERALS CH
1.0000 | ORAL_TABLET | Freq: Every day | ORAL | Status: DC
  Administered 2017-11-06 – 2017-11-07 (×2): 1 via ORAL
  Filled 2017-11-06 (×2): qty 1

## 2017-11-06 NOTE — Progress Notes (Signed)
[place] at [place] NAME: Patrick Ayala    MR#:  [id number]  DATE OF BIRTH:  1940/09/16  SUBJECTIVE:  CHIEF COMPLAINT: Is resting comfortably.  Denies any bleeding today.  Denies any abdominal pain.  REVIEW OF SYSTEMS:  CONSTITUTIONAL: No fever, fatigue or weakness.  EYES: No blurred or double vision.  EARS, NOSE, AND THROAT: No tinnitus or ear pain.  RESPIRATORY: No cough, shortness of breath, wheezing or hemoptysis.  CARDIOVASCULAR: No chest pain, orthopnea, edema.  GASTROINTESTINAL: No nausea, vomiting, diarrhea or abdominal pain.  GENITOURINARY: No dysuria, hematuria.  ENDOCRINE: No polyuria, nocturia,  HEMATOLOGY: No anemia, easy bruising or bleeding SKIN: No rash or lesion. MUSCULOSKELETAL: No joint pain or arthritis.   NEUROLOGIC: No tingling, numbness, weakness.  PSYCHIATRY: No anxiety or depression.   DRUG ALLERGIES:  No Known Allergies  VITALS:  Blood pressure (!) 145/71, pulse 72, temperature 98.4 F (36.9 C), temperature source Oral, resp. rate 18, height _0  (1.753 m), weight 50.6 kg (111 lb 8 oz), SpO2 99 %.  PHYSICAL EXAMINATION:  GENERAL:  77 y.o.-year-old patient lying in the bed with no acute distress.  EYES: Pupils equal, round, reactive to light and accommodation. No scleral icterus. Extraocular muscles intact.  HEENT: Head atraumatic, normocephalic. Oropharynx and nasopharynx clear.  NECK:  Supple, no jugular venous distention. No thyroid enlargement, no tenderness.  LUNGS: Normal breath sounds bilaterally, no wheezing, rales,rhonchi or crepitation. No use of accessory muscles of respiration.  CARDIOVASCULAR: S1, S2 normal. No murmurs, rubs, or gallops.  ABDOMEN: Soft, nontender, nondistended. Bowel sounds present. No organomegaly or mass.  EXTREMITIES: No pedal edema, cyanosis, or clubbing.  NEUROLOGIC: Cranial nerves II through XII are intact. Muscle strength 5/5 in all extremities. Sensation intact.  Gait not checked.  PSYCHIATRIC: The patient is alert and oriented x 3.  SKIN: No obvious rash, lesion, or ulcer.    LABORATORY PANEL:   CBC Recent Labs  Lab 11/06/17 0421  WBC 4.9  HGB 8.1*  HCT 24.2*  PLT 49*   ------------------------------------------------------------------------------------------------------------------  Chemistries  Recent Labs  Lab 11/02/17 1759 11/03/17 0451  NA 140 144  K 3.9 3.7  CL 94* 98  CO2 41* 42*  GLUCOSE 218* 87  BUN 15 14  CREATININE 0.59* 0.58*  CALCIUM 8.8* 8.4*  AST 25  --   ALT 18  --   ALKPHOS 46  --   BILITOT 0.7  --    ------------------------------------------------------------------------------------------------------------------  Cardiac Enzymes No results for input(s): TROPONINI in the last 168 hours. ------------------------------------------------------------------------------------------------------------------  RADIOLOGY:  No results found.  EKG:   Orders placed or performed during the hospital encounter of 09/21/17  . ED EKG  . ED EKG    ASSESSMENT AND PLAN:     GI bleed -seems like his bleed may be from lower GI tract, patient had a history of diverticular bleed in the past  PPI drip changed to Protonix p.o. once daily  GI following.   EGD 7/28 Multiple dispersed, diminutive non-bleeding erosions were found in the gastric antrum. There were no stigmata of recent bleeding. Biopsies were taken with a cold forceps for Helicobacter pylori testing. Colonoscopy 11/05/2017-polyps were removed but no bleeding source identified  Monitor hemoglobin and hematocrit closely.  Hemoglobin at 9.0--8.1 GI has recommended to continue clear liquid diet and bleeding scan if patient profusely bleed otherwise continue to monitor her Pain management as needed     COPD (chronic obstructive pulmonary disease) (HCC) - no exacerbation.  Breathing treatments as needed     Thrombocytopenia ([place]) -this is chronic, unclear  etiology as per Dr. Kem Parkinson note.  Platelet count at 58,000--49,000   patient is concurrent diagnoses of secondary polycythemia as well as carboxyhemoglobinemia area holding all anticoagulation for the same and also due to his GI bleed as above Patient has some bruises on his extremities    Hyperlipidemia -continue Lipitor    BPH (benign prostatic hyperplasia) -continue home meds   DVT prophylaxis with SCDs given thrombocytopenia and GI bleed  All the records are reviewed and case discussed with Care Management/Social Workerr. Management plans discussed with the patient, family and they are in agreement.  CODE STATUS: fc   TOTAL TIME TAKING CARE OF THIS PATIENT: 33 minutes.   POSSIBLE D/C IN 1-2  DAYS, DEPENDING ON CLINICAL CONDITION.  Note: This dictation was prepared with Dragon dictation along with smaller phrase technology. Any transcriptional errors that result from this process are unintentional.   Nicholes Mango M.D on 11/06/2017 at 8:25 PM  Between 7am to 6pm - Pager - [phone] After 6pm go to [email or web address] - password [email or web address] [place] [facility]  Office  [phone]  CC: Primary care physician; Guadalupe Maple, MD

## 2017-11-06 NOTE — Plan of Care (Signed)
No signs of overt bleeding noted.

## 2017-11-06 NOTE — Progress Notes (Addendum)
[place] at [place] NAME: Patrick Ayala    MR#:  [id number]  DATE OF BIRTH:  1940/12/01  SUBJECTIVE:  CHIEF COMPLAINT: Is resting comfortably.  Bleeding is improving.  Had a colonoscopy yesterday REVIEW OF SYSTEMS:  CONSTITUTIONAL: No fever, fatigue or weakness.  EYES: No blurred or double vision.  EARS, NOSE, AND THROAT: No tinnitus or ear pain.  RESPIRATORY: No cough, shortness of breath, wheezing or hemoptysis.  CARDIOVASCULAR: No chest pain, orthopnea, edema.  GASTROINTESTINAL: No nausea, vomiting, diarrhea or abdominal pain.  GENITOURINARY: No dysuria, hematuria.  ENDOCRINE: No polyuria, nocturia,  HEMATOLOGY: No anemia, easy bruising or bleeding SKIN: No rash or lesion. MUSCULOSKELETAL: No joint pain or arthritis.   NEUROLOGIC: No tingling, numbness, weakness.  PSYCHIATRY: No anxiety or depression.   DRUG ALLERGIES:  No Known Allergies  VITALS:  Blood pressure (!) 145/71, pulse 72, temperature 98.4 F (36.9 C), temperature source Oral, resp. rate 18, height _0  (1.753 m), weight 50.6 kg (111 lb 8 oz), SpO2 99 %.  PHYSICAL EXAMINATION:  GENERAL:  77 y.o.-year-old patient lying in the bed with no acute distress.  EYES: Pupils equal, round, reactive to light and accommodation. No scleral icterus. Extraocular muscles intact.  HEENT: Head atraumatic, normocephalic. Oropharynx and nasopharynx clear.  NECK:  Supple, no jugular venous distention. No thyroid enlargement, no tenderness.  LUNGS: Normal breath sounds bilaterally, no wheezing, rales,rhonchi or crepitation. No use of accessory muscles of respiration.  CARDIOVASCULAR: S1, S2 normal. No murmurs, rubs, or gallops.  ABDOMEN: Soft, nontender, nondistended. Bowel sounds present. No organomegaly or mass.  EXTREMITIES: No pedal edema, cyanosis, or clubbing.  NEUROLOGIC: Cranial nerves II through XII are intact. Muscle strength 5/5 in all extremities. Sensation intact. Gait  not checked.  PSYCHIATRIC: The patient is alert and oriented x 3.  SKIN: No obvious rash, lesion, or ulcer.    LABORATORY PANEL:   CBC Recent Labs  Lab 11/06/17 0421  WBC 4.9  HGB 8.1*  HCT 24.2*  PLT 49*   ------------------------------------------------------------------------------------------------------------------  Chemistries  Recent Labs  Lab 11/02/17 1759 11/03/17 0451  NA 140 144  K 3.9 3.7  CL 94* 98  CO2 41* 42*  GLUCOSE 218* 87  BUN 15 14  CREATININE 0.59* 0.58*  CALCIUM 8.8* 8.4*  AST 25  --   ALT 18  --   ALKPHOS 46  --   BILITOT 0.7  --    ------------------------------------------------------------------------------------------------------------------  Cardiac Enzymes No results for input(s): TROPONINI in the last 168 hours. ------------------------------------------------------------------------------------------------------------------  RADIOLOGY:  No results found.  EKG:   Orders placed or performed during the hospital encounter of 09/21/17  . ED EKG  . ED EKG    ASSESSMENT AND PLAN:     GI bleed -seems like his bleed may be from lower GI tract, patient had a history of diverticular bleed in the past  PPI drip changed to Protonix p.o. once daily  GI following.   EGD 7/28 Multiple dispersed, diminutive non-bleeding erosions were found in the gastric antrum. There were no stigmata of recent bleeding. Biopsies were taken with a cold forceps for Helicobacter pylori testing. Colonoscopy 11/05/2017-polyps were removed but no bleeding source identified  Monitor hemoglobin and hematocrit closely.  Hemoglobin at 9.1 GI has recommended to continue clear liquid diet and bleeding scan if patient profusely bleed Pain management as needed  currently patient is denying any abdominal pain   COPD (chronic obstructive pulmonary disease) (HCC) - no exacerbation.  Breathing treatments as needed     Thrombocytopenia ([place]) -this is chronic, unclear  etiology as per Dr. Kem Parkinson note.  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 Management plans discussed with the patient, family and they are in agreement.  CODE STATUS: fc   TOTAL TIME TAKING CARE OF THIS PATIENT: 36  minutes.   POSSIBLE D/C IN 1-2  DAYS, DEPENDING ON CLINICAL CONDITION.  Note: This dictation was prepared with Dragon dictation along with smaller phrase technology. Any transcriptional errors that result from this process are unintentional.   Nicholes Mango M.D on 11/06/2017 at 8:21 PM  Between 7am to 6pm - Pager - [phone] After 6pm go to [email or web address] - password [email or web address] [place] [facility]  Office  [phone]  CC: Primary care physician; Guadalupe Maple, MD

## 2017-11-06 NOTE — Progress Notes (Addendum)
Nutrition Follow Up Note   DOCUMENTATION CODES:   Severe malnutrition in context of chronic illness  INTERVENTION:   Boost Breeze po TID, each supplement provides 250 kcal and 9 grams of protein  MVI daily  Vitamin C 270m po BID   Dysphagia 3 diet when advanced   NUTRITION DIAGNOSIS:   Severe Malnutrition related to chronic illness(COPD) as evidenced by severe fat depletion, severe muscle depletion.  GOAL:   Patient will meet greater than or equal to 90% of their needs  MONITOR:   PO intake, Supplement acceptance, Labs, Weight trends, I & O's, Skin  REASON FOR ASSESSMENT:   Other (Comment)(low BMI)    ASSESSMENT:   77y/o male with h/o diverticulitis and COPD admitted with GIB Pt s/p EGD 7/28  Met with pt in room today. Pt reports good appetite and oral intake pta and today. Pt eating 100% of his clear liquid diet in hospital and would like his diet advanced. Pt reports that he does not drink supplements at home and he is not willing to drink anything "milky". Pt requesting a mechanical soft diet. Per chart, pt with 8lb(7%) weight loss over 2 months; this is not significant given the time frame. Pt s/p colonoscopy 7/29; noted to have a single ulcer or tear in the rectum as well as some polyps. RD will order Boost Breeze and MVI to help pt meet his estimated needs. Pt noted to have ecchymosis on bilateral arms, GIB and malnutrition; recommend vitamin C supplementation.    Medications reviewed and include: protonix  Labs reviewed:  Hgb 8.1(L), Hct 24.2(L)  Nutrition-Focused physical exam completed. Findings are severe fat and muscle depletions over entire body, and no edema.   Diet Order:   Diet Order           Diet clear liquid Room service appropriate? Yes; Fluid consistency: Thin  Diet effective now         EDUCATION NEEDS:   Education needs have been addressed  Skin:  Skin Assessment: Reviewed RN Assessment(ecchymosis )  Last BM:  7/29- type 7 s/p bowel  prep   Height:   Ht Readings from Last 1 Encounters:  11/02/17 _0  (1.753 m)    Weight:   Wt Readings from Last 1 Encounters:  11/03/17 112 lb 9.6 oz (51.1 kg)    Ideal Body Weight:  72.7 kg  BMI:  Body mass index is 16.63 kg/m.  Estimated Nutritional Needs:   Kcal:  1700-1900kcal/day   Protein:  76-87g/day   Fluid:  >1.5L/day   CKoleen DistanceMS, RD, LDN Pager #- [phone]#- [phone] Hours Pager: [phone]

## 2017-11-06 NOTE — Plan of Care (Signed)
  Problem: Clinical Measurements: Goal: Ability to maintain clinical measurements within normal limits will improve Outcome: Not Progressing Note:  Patient's RBC's are only 2.5 today. Will continue to monitor for any signs / symptoms of bleeding. Wenda Low [facility]

## 2017-11-07 LAB — CBC
HEMATOCRIT: 25.9 % — AB (ref 40.0–52.0)
HEMOGLOBIN: 8.8 g/dL — AB (ref 13.0–18.0)
MCH: 33 pg (ref 26.0–34.0)
MCHC: 33.9 g/dL (ref 32.0–36.0)
MCV: 97.3 fL (ref 80.0–100.0)
Platelets: 61 10*3/uL — ABNORMAL LOW (ref 150–440)
RBC: 2.66 MIL/uL — ABNORMAL LOW (ref 4.40–5.90)
RDW: 14.4 % (ref 11.5–14.5)
WBC: 4.1 10*3/uL (ref 3.8–10.6)

## 2017-11-07 MED ORDER — ACETAMINOPHEN 325 MG PO TABS: 650.0000 mg | ORAL_TABLET | Freq: Four times a day (QID) | ORAL | Status: DC | PRN

## 2017-11-07 MED ORDER — FERROUS SULFATE 325 (65 FE) MG PO TABS: 325.0000 mg | ORAL_TABLET | Freq: Two times a day (BID) | ORAL | Status: DC

## 2017-11-07 MED ORDER — FERROUS SULFATE 325 (65 FE) MG PO TABS: 325.0000 mg | ORAL_TABLET | Freq: Two times a day (BID) | ORAL | 3 refills | Status: DC

## 2017-11-07 MED ORDER — BUDESONIDE-FORMOTEROL FUMARATE 160-4.5 MCG/ACT IN AERO: 2.0000 | INHALATION_SPRAY | Freq: Every morning | RESPIRATORY_TRACT | 4 refills | Status: DC

## 2017-11-07 MED ORDER — ENSURE ENLIVE PO LIQD: 237.0000 mL | Freq: Three times a day (TID) | ORAL | Status: DC

## 2017-11-07 MED ORDER — ADULT MULTIVITAMIN W/MINERALS CH: 1.0000 | ORAL_TABLET | Freq: Every day | ORAL | Status: AC

## 2017-11-07 MED ORDER — PANTOPRAZOLE SODIUM 40 MG PO TBEC: 40.0000 mg | DELAYED_RELEASE_TABLET | Freq: Every day | ORAL | 0 refills | Status: DC

## 2017-11-07 MED ORDER — ASCORBIC ACID 250 MG PO TABS: 250.0000 mg | ORAL_TABLET | Freq: Two times a day (BID) | ORAL | Status: DC

## 2017-11-07 MED ORDER — DOCUSATE SODIUM 100 MG PO CAPS: 100.0000 mg | ORAL_CAPSULE | Freq: Two times a day (BID) | ORAL | Status: DC

## 2017-11-07 MED ORDER — DOCUSATE SODIUM 100 MG PO CAPS: 100.0000 mg | ORAL_CAPSULE | Freq: Two times a day (BID) | ORAL | 0 refills | Status: DC

## 2017-11-07 MED ORDER — ENSURE ENLIVE PO LIQD: 237.0000 mL | Freq: Three times a day (TID) | ORAL | 0 refills | Status: DC

## 2017-11-07 NOTE — Discharge Instructions (Signed)
Follow-up with primary care physician in 2 to 3 days Follow-up with gastroenterology Dr. Marius Ditch in 5 to 7 days

## 2017-11-07 NOTE — Progress Notes (Signed)
Patrick Ayala to be D/C'd Home per MD order.  Discussed prescriptions and follow up appointments with the patient. Prescriptions given to patient, medication list explained in detail. Pt verbalized understanding.  Allergies as of 11/07/2017   No Known Allergies     Medication List    TAKE these medications   acetaminophen 325 MG tablet Commonly known as:  TYLENOL Take 2 tablets (650 mg total) by mouth every 6 (six) hours as needed for mild pain (or Fever >/= 101).   albuterol 108 (90 Base) MCG/ACT inhaler Commonly known as:  PROVENTIL HFA;VENTOLIN HFA Inhale 1-2 puffs into the lungs every 6 (six) hours as needed for wheezing or shortness of breath.   amLODipine 5 MG tablet Commonly known as:  NORVASC Take 1 tablet (5 mg total) by mouth daily.   ascorbic acid 250 MG tablet Commonly known as:  VITAMIN C Take 1 tablet (250 mg total) by mouth 2 (two) times daily.   aspirin 81 MG tablet ASA 81 mg po once a day (start taking it from 08/02/2013)   Quantity: 0;  Refills: 0  Active   atorvastatin 20 MG tablet Commonly known as:  LIPITOR Take 1 tablet (20 mg total) daily by mouth.   benazepril 40 MG tablet Commonly known as:  LOTENSIN Take 1 tablet (40 mg total) by mouth daily.   budesonide-formoterol 160-4.5 MCG/ACT inhaler Commonly known as:  SYMBICORT Inhale 2 puffs into the lungs every morning. INHALE 2 TIMES DAILY What changed:  Another medication with the same name was removed. Continue taking this medication, and follow the directions you see here.   docusate sodium 100 MG capsule Commonly known as:  COLACE Take 1 capsule (100 mg total) by mouth 2 (two) times daily.   feeding supplement (ENSURE ENLIVE) Liqd Take 237 mLs by mouth 3 (three) times daily between meals.   ferrous sulfate 325 (65 FE) MG tablet Take 1 tablet (325 mg total) by mouth 2 (two) times daily with a meal.   multivitamin with minerals Tabs tablet Take 1 tablet by mouth daily. Start taking on:   11/08/2017   pantoprazole 40 MG tablet Commonly known as:  PROTONIX Take 1 tablet (40 mg total) by mouth daily before breakfast. Start taking on:  11/08/2017       Vitals:   11/07/17 0850 11/07/17 1522  BP: (!) 152/67 139/66  Pulse: 95 73  Resp: 18 18  Temp: 98.3 F (36.8 C) 98.7 F (37.1 C)  SpO2: 92% 99%   Tele box removed and returned. Skin clean, dry and intact without evidence of skin break down, no evidence of skin tears noted. IV catheter discontinued intact. Site without signs and symptoms of complications. Dressing and pressure applied. Pt denies pain at this time. No complaints noted.  An After Visit Summary was printed and given to the patient. Patient escorted via [place], and D/C home via private auto.  Patrick Ayala

## 2017-11-07 NOTE — Plan of Care (Signed)
  Problem: Activity: Goal: Risk for activity intolerance will decrease Outcome: Progressing   Problem: Nutrition: Goal: Adequate nutrition will be maintained Outcome: Progressing   Problem: Elimination: Goal: Will not experience complications related to urinary retention Outcome: Progressing   Problem: Safety: Goal: Ability to remain free from injury will improve Outcome: Progressing   Problem: Skin Integrity: Goal: Risk for impaired skin integrity will decrease Outcome: Progressing   Problem: Education: Goal: Knowledge of General Education information will improve Description Including pain rating scale, medication(s)/side effects and non-pharmacologic comfort measures Outcome: Completed/Met   Problem: Coping: Goal: Level of anxiety will decrease Outcome: Completed/Met   Problem: Pain Managment: Goal: General experience of comfort will improve Outcome: Completed/Met

## 2017-11-07 NOTE — Discharge Summary (Signed)
[facility] NAME: Patrick Ayala    MR#:  [id number]  DATE OF BIRTH:  November 13, 1940  DATE OF ADMISSION:  11/02/2017 ADMITTING PHYSICIAN: Lance Coon, MD  DATE OF DISCHARGE: 11/07/17  PRIMARY CARE PHYSICIAN: Guadalupe Maple, MD    ADMISSION DIAGNOSIS:  Rectal bleeding [K62.5]  DISCHARGE DIAGNOSIS:   Rectal bleeding unclear etiology Severe protein calorie malnutrition SECONDARY DIAGNOSIS:   Past Medical History:  Diagnosis Date  . Carboxyhemoglobinemia   . COPD (chronic obstructive pulmonary disease) (HCC)    on 2.5L chronic home o2  . Polycythemia, secondary 10/20/2014  . Thrombocytopenia [facility])     HOSPITAL COURSE:   HPI  Patrick Ayala  is a 77 y.o. male who presents with 1 day of dark tarry stools mixed with some blood.  Patient states that he has had 4-5 episodes today of very dark stools mixed with some bright blood.  He states that this is happened to him before when he had diverticulitis, and he had a colonoscopy done at that time which showed a bleed.  His hemoglobin is dropped one-point from prior values last month.  He denies any abdominal pain, nausea or vomiting.  Hospitalist were called for admission  GI bleed -unclear etiology at this time  PPI drip changed to Protonix p.o. once daily  GI following.  Care to discharge patient from GI standpoint as the patient is clinically stable.  Hemoglobin is stable.  Outpatient follow-up with Dr. Marius Ditch in a week is recommended  EGD 7/28 Multiple dispersed, diminutive non-bleeding erosions were found in the gastric antrum. There were no stigmata of recent bleeding. Biopsies were taken with a cold forceps for Helicobacter pylori testing. Colonoscopy 11/05/2017-polyps were removed but no bleeding source identified  Monitor hemoglobin and hematocrit closely.  Hemoglobin at 9.0--8.1--8.8 GI has recommended to continue clear liquid diet and bleeding scan if patient profusely bleed  otherwise continue to monitor her We will start him on iron supplements and stool softeners   COPD (chronic obstructive pulmonary disease) (HCC) - no exacerbation.  Breathing treatments as needed   Thrombocytopenia ([place]) -this is chronic, unclear etiology as per Dr. Kem Parkinson note.  Platelet count at 58,000--49,000 --61.000  patient is concurrent diagnoses of secondary polycythemia as well as carboxyhemoglobinemia area holding all anticoagulation for the same and also due to his GI bleed as above Patient has some bruises on his extremities  Hyperlipidemia -continue Lipitor  BPH (benign prostatic hyperplasia) -continue home meds   DVT prophylaxis with SCDs given thrombocytopenia and GI bleed    DISCHARGE CONDITIONS:   STABLE  CONSULTS OBTAINED:  Treatment Team:  Lin Landsman, MD   PROCEDURES EGD and colonoscopy  DRUG ALLERGIES:  No Known Allergies  DISCHARGE MEDICATIONS:   Allergies as of 11/07/2017   No Known Allergies     Medication List    STOP taking these medications   aspirin 81 MG tablet     TAKE these medications   acetaminophen 325 MG tablet Commonly known as:  TYLENOL Take 2 tablets (650 mg total) by mouth every 6 (six) hours as needed for mild pain (or Fever >/= 101).   albuterol 108 (90 Base) MCG/ACT inhaler Commonly known as:  PROVENTIL HFA;VENTOLIN HFA Inhale 1-2 puffs into the lungs every 6 (six) hours as needed for wheezing or shortness of breath.   amLODipine 5 MG tablet Commonly known as:  NORVASC Take 1 tablet (5 mg total) by mouth daily.   ascorbic  acid 250 MG tablet Commonly known as:  VITAMIN C Take 1 tablet (250 mg total) by mouth 2 (two) times daily.   atorvastatin 20 MG tablet Commonly known as:  LIPITOR Take 1 tablet (20 mg total) daily by mouth.   benazepril 40 MG tablet Commonly known as:  LOTENSIN Take 1 tablet (40 mg total) by mouth daily.   budesonide-formoterol 160-4.5 MCG/ACT inhaler Commonly  known as:  SYMBICORT Inhale 2 puffs into the lungs every morning. INHALE 2 TIMES DAILY What changed:  Another medication with the same name was removed. Continue taking this medication, and follow the directions you see here.   docusate sodium 100 MG capsule Commonly known as:  COLACE Take 1 capsule (100 mg total) by mouth 2 (two) times daily.   feeding supplement (ENSURE ENLIVE) Liqd Take 237 mLs by mouth 3 (three) times daily between meals.   ferrous sulfate 325 (65 FE) MG tablet Take 1 tablet (325 mg total) by mouth 2 (two) times daily with a meal.   multivitamin with minerals Tabs tablet Take 1 tablet by mouth daily. Start taking on:  11/08/2017   pantoprazole 40 MG tablet Commonly known as:  PROTONIX Take 1 tablet (40 mg total) by mouth daily before breakfast. Start taking on:  11/08/2017        DISCHARGE INSTRUCTIONS:   Follow-up with primary care physician in 2 to 3 days Follow-up with gastroenterology Dr. Marius Ditch in 5 to 7 days  DIET:  LOW FAT   DISCHARGE CONDITION:  Stable  ACTIVITY:  Activity as tolerated  OXYGEN:  Home Oxygen: Yes.     Oxygen Delivery: 2 liters/min via Patient connected to nasal cannula oxygen  DISCHARGE LOCATION:  home   If you experience worsening of your admission symptoms, develop shortness of breath, life threatening emergency, suicidal or homicidal thoughts you must seek medical attention immediately by calling 911 or calling your MD immediately  if symptoms less severe.  You Must read complete instructions/literature along with all the possible adverse reactions/side effects for all the Medicines you take and that have been prescribed to you. Take any new Medicines after you have completely understood and accpet all the possible adverse reactions/side effects.   Please note  You were cared for by a hospitalist during your hospital stay. If you have any questions about your discharge medications or the care you received while you were  in the hospital after you are discharged, you can call the unit and asked to speak with the hospitalist on call if the hospitalist that took care of you is not available. Once you are discharged, your primary care physician will handle any further medical issues. Please note that NO REFILLS for any discharge medications will be authorized once you are discharged, as it is imperative that you return to your primary care physician (or establish a relationship with a primary care physician if you do not have one) for your aftercare needs so that they can reassess your need for medications and monitor your lab values.     Today  Chief Complaint  Patient presents with  . Rectal Bleeding   Is feeling fine.  Denies any complaints.  No active bleeding.  Tolerating diet.  Okay to discharge from GI standpoint.  Also okay to resume patient's aspirin enteric-coated 81 mg per GI discussed with Dr. Vicente Males  ROS:  CONSTITUTIONAL: Denies fevers, chills. Denies any fatigue, weakness.  EYES: Denies blurry vision, double vision, eye pain. EARS, NOSE, THROAT: Denies tinnitus, ear pain,  hearing loss. RESPIRATORY: Denies cough, wheeze, shortness of breath.  CARDIOVASCULAR: Denies chest pain, palpitations, edema.  GASTROINTESTINAL: Denies nausea, vomiting, diarrhea, abdominal pain. Denies bright red blood per rectum. GENITOURINARY: Denies dysuria, hematuria. ENDOCRINE: Denies nocturia or thyroid problems. HEMATOLOGIC AND LYMPHATIC: Denies easy bruising or bleeding. SKIN: Denies rash or lesion. MUSCULOSKELETAL: Denies pain in neck, back, shoulder, knees, hips or arthritic symptoms.  NEUROLOGIC: Denies paralysis, paresthesias.  PSYCHIATRIC: Denies anxiety or depressive symptoms.   VITAL SIGNS:  Blood pressure (!) 152/67, pulse 95, temperature 98.3 F (36.8 C), temperature source Oral, resp. rate 18, height _0  (1.753 m), weight 50 kg (110 lb 4.8 oz), SpO2 92 %.  I/O:    Intake/Output Summary (Last 24 hours)  at 11/07/2017 1026 Last data filed at 11/07/2017 1025 Gross per 24 hour  Intake 1083 ml  Output -  Net 1083 ml    PHYSICAL EXAMINATION:  GENERAL:  77 y.o.-year-old patient lying in the bed with no acute distress.  EYES: Pupils equal, round, reactive to light and accommodation. No scleral icterus. Extraocular muscles intact.  HEENT: Head atraumatic, normocephalic. Oropharynx and nasopharynx clear.  NECK:  Supple, no jugular venous distention. No thyroid enlargement, no tenderness.  LUNGS: Normal breath sounds bilaterally, no wheezing, rales,rhonchi or crepitation. No use of accessory muscles of respiration.  CARDIOVASCULAR: S1, S2 normal. No murmurs, rubs, or gallops.  ABDOMEN: Soft, non-tender, non-distended. Bowel sounds present. No organomegaly or mass.  EXTREMITIES: No pedal edema, cyanosis, or clubbing.  NEUROLOGIC: Cranial nerves II through XII are intact. Muscle strength 5/5 in all extremities. Sensation intact. Gait not checked.  PSYCHIATRIC: The patient is alert and oriented x 3.  SKIN: No obvious rash, lesion, or ulcer.   DATA REVIEW:   CBC Recent Labs  Lab 11/07/17 0815  WBC 4.1  HGB 8.8*  HCT 25.9*  PLT 61*    Chemistries  Recent Labs  Lab 11/02/17 1759 11/03/17 0451  NA 140 144  K 3.9 3.7  CL 94* 98  CO2 41* 42*  GLUCOSE 218* 87  BUN 15 14  CREATININE 0.59* 0.58*  CALCIUM 8.8* 8.4*  AST 25  --   ALT 18  --   ALKPHOS 46  --   BILITOT 0.7  --     Cardiac Enzymes No results for input(s): TROPONINI in the last 168 hours.  Microbiology Results  Results for orders placed or performed during the hospital encounter of 09/21/17  Blood Culture (routine x 2)     Status: None   Collection Time: 09/21/17  6:43 PM  Result Value Ref Range Status   Specimen Description BLOOD LEFT ANTECUBITAL  Final   Special Requests   Final    Blood Culture results may not be optimal due to an excessive volume of blood received in culture bottles   Culture   Final    NO  GROWTH 5 DAYS Performed at [facility], [address], [place], [place] [address]    Report Status 09/26/2017 FINAL  Final  Blood Culture (routine x 2)     Status: None   Collection Time: 09/21/17  6:43 PM  Result Value Ref Range Status   Specimen Description BLOOD RIGHT ANTECUBITAL  Final   Special Requests Blood Culture adequate volume  Final   Culture   Final    NO GROWTH 5 DAYS Performed at [facility], [address] [place], [place] [address]    Report Status 09/26/2017 FINAL  Final  MRSA PCR Screening  Status: None   Collection Time: 09/21/17  9:16 PM  Result Value Ref Range Status   MRSA by PCR NEGATIVE NEGATIVE Final    Comment:        The GeneXpert MRSA Assay (FDA approved for NASAL specimens only), is one component of a comprehensive MRSA colonization surveillance program. It is not intended to diagnose MRSA infection nor to guide or monitor treatment for MRSA infections. Performed at [facility], [address] [place], [place] [address]     RADIOLOGY:  US Abdomen Complete  Result Date: 11/03/2017 CLINICAL DATA:  Weight loss EXAM: ABDOMEN ULTRASOUND COMPLETE COMPARISON:  CT 10/09/2012 FINDINGS: Gallbladder: No gallstones or wall thickening visualized. No sonographic Murphy sign noted by sonographer. Common bile duct: Diameter: Normal caliber, 2 mm Liver: No focal lesion identified. Within normal limits in parenchymal echogenicity. Portal vein is patent on color Doppler imaging with normal direction of blood flow towards the liver. IVC: No abnormality visualized. Pancreas: Visualized portion unremarkable. Spleen: Size and appearance within normal limits. Right Kidney: Length: 11.4 cm. Echogenicity within normal limits. No mass or hydronephrosis visualized. Left Kidney: Length: 11.1 cm. 3.4 cm lower pole parapelvic cyst. No hydronephrosis. Abdominal aorta: No aneurysm visualized. Other findings: None. IMPRESSION: No acute findings.  Electronically Signed   By: Rolm Baptise M.D.   On: 11/03/2017 16:32    EKG:   Orders placed or performed during the hospital encounter of 09/21/17  . ED EKG  . ED EKG      Management plans discussed with the patient, family and they are in agreement.  CODE STATUS:     Code Status Orders  (From admission, onward)        Start     Ordered   11/03/17 0007  Full code  Continuous     11/03/17 0006    Code Status History    Date Active Date Inactive Code Status Order ID Comments User Context   09/21/2017 2058 09/23/2017 1916 Full Code [id number]  Gladstone Lighter, MD Inpatient      TOTAL TIME TAKING CARE OF THIS PATIENT: 43  minutes.   Note: This dictation was prepared with Dragon dictation along with smaller phrase technology. Any transcriptional errors that result from this process are unintentional.   _0 @  on 11/07/2017 at 10:26 AM  Between 7am to 6pm - Pager - [phone]  After 6pm go to [email or web address] - password [email or web address] [facility]  Office  [phone]  CC: Primary care physician; Guadalupe Maple, MD

## 2017-11-07 NOTE — Care Management Important Message (Signed)
Copy of signed IM left with patient in room.  

## 2017-11-07 NOTE — Care Management (Signed)
No discharge needs identified by members of the care team

## 2017-11-09 ENCOUNTER — Telehealth: Payer: Self-pay

## 2017-11-09 LAB — SURGICAL PATHOLOGY

## 2017-11-09 NOTE — Telephone Encounter (Signed)
Transition Care Management Follow-up Telephone Call  How have you been since you were released from the hospital? "I am doing pretty good today"  Do you understand why you were in the hospital? yes  Do you have a copy of your discharge instructions Yes Do you understand the discharge instrcutions? yes  Where were you discharged to? Home  Do you have support at home? Yes    Items Reviewed:  Medications obtained Yes  Medications reviewed: Yes  Dietary changes reviewed: yes  Home Health? N/A  DME ordered at discharge obtained? NA  Medical supplies: NA    Functional Questionnaire:   Activities of Daily Living (ADLs):   He states they are independent in the following: ambulation, bathing and hygiene, feeding, continence, grooming, toileting, dressing and medication management States they require assistance with the following: none  Any transportation issues/concerns?: no  Any patient concerns? Yes, concerned with appointments being to close together to see PCP and GI. appts moved to give patient time to get to other office.  Confirmed importance and date/time of follow-up visits scheduled with PCP: yes 11/12/2017 at 3:30pm with [place] appointment scheduled with specialist? Yes 11/12/2017 at 2:00pm with Dr.Vanga (GI)  Confirmed with patient if condition begins to worsen call PCP or If it's emergency go to the ER.

## 2017-11-10 ENCOUNTER — Other Ambulatory Visit: Payer: Self-pay | Admitting: Family Medicine

## 2017-11-10 DIAGNOSIS — E7800 Pure hypercholesterolemia, unspecified: Secondary | ICD-10-CM

## 2017-11-12 ENCOUNTER — Ambulatory Visit (INDEPENDENT_AMBULATORY_CARE_PROVIDER_SITE_OTHER): Payer: Medicare HMO | Admitting: Gastroenterology

## 2017-11-12 ENCOUNTER — Encounter: Payer: Self-pay | Admitting: Family Medicine

## 2017-11-12 ENCOUNTER — Encounter: Payer: Self-pay | Admitting: Gastroenterology

## 2017-11-12 ENCOUNTER — Other Ambulatory Visit: Payer: Self-pay

## 2017-11-12 ENCOUNTER — Ambulatory Visit (INDEPENDENT_AMBULATORY_CARE_PROVIDER_SITE_OTHER): Payer: Medicare HMO | Admitting: Family Medicine

## 2017-11-12 VITALS — BP 117/53 | HR 109 | Ht 69.0 in | Wt 119.8 lb

## 2017-11-12 VITALS — BP 130/60 | HR 98 | Temp 98.9°F | Ht 69.0 in | Wt 119.5 lb

## 2017-11-12 DIAGNOSIS — K625 Hemorrhage of anus and rectum: Secondary | ICD-10-CM

## 2017-11-12 DIAGNOSIS — I10 Essential (primary) hypertension: Secondary | ICD-10-CM | POA: Diagnosis not present

## 2017-11-12 DIAGNOSIS — E7800 Pure hypercholesterolemia, unspecified: Secondary | ICD-10-CM

## 2017-11-12 DIAGNOSIS — R634 Abnormal weight loss: Secondary | ICD-10-CM

## 2017-11-12 MED ORDER — ATORVASTATIN CALCIUM 20 MG PO TABS: ORAL_TABLET | ORAL | 4 refills | Status: DC

## 2017-11-12 MED ORDER — AMLODIPINE BESYLATE 5 MG PO TABS: 5.0000 mg | ORAL_TABLET | Freq: Every day | ORAL | 2 refills | Status: DC

## 2017-11-12 MED ORDER — BENAZEPRIL HCL 40 MG PO TABS: 40.0000 mg | ORAL_TABLET | Freq: Every day | ORAL | 2 refills | Status: DC

## 2017-11-12 NOTE — Patient Outreach (Signed)
[facility] Care Management  11/12/2017  MEHTAAB MAYEDA [birth year] [id number]   EMMI- General Discharge RED ON EMMI ALERT Day # 1  Date: 11/09/17 Red Alert Reason: Unfilled prescriptions? yes   Outreach attempt # 1 No answer.  HIPAA compliant voice message left.   Plan: RN CM will send letter and attempt patient again within 4 business days.  Jone Baseman, RN, MSN [facility] Care Management Care Management Coordinator Direct Line [phone] Toll Free: [phone]  Fax: [phone]

## 2017-11-12 NOTE — Progress Notes (Signed)
Patrick Darby, MD Patrick Ayala  [place], [place] [address]  Main: [phone]  Fax: [phone]    Gastroenterology Consultation  Referring Provider:     Guadalupe Maple, MD Primary Care Physician:  Patrick Maple, MD Primary Gastroenterologist:  Dr. Cephas Ayala Reason for Consultation:     Weight loss, melena        HPI:   Patrick Ayala is a 77 y.o. male referred by Dr. Guadalupe Maple, MD  for consultation & management of or weight loss and melena. Patient was recently admitted to [facility] secondary to unintentional weight loss and melena. He has history of chronic polycythemia secondary to tobacco use, chronic unexplained thrombocytopenia, chronic COPD, on home oxygen, closely followed by Dr. Mike Ayala at [facility]. Patient underwent endoscopy evaluation including upper endoscopy which revealed antral erosions and biopsies negative for H. Pylori. He also underwent colonoscopy which revealed a solitary rectal ulcer and a subcentimeter colon polyp which was tubular adenoma only. His iron studies are not consistent with iron deficiency anemia. His hemoglobin on admission was 9.6, last normal hemoglobin was 14.2 on 09/2017. He did have new-onset of macrocytosis. His B12 And folate levels have been normal. Antiparietal cell antibodies are negative.  Interval summary: He gained about 8 pounds since hospital discharge. He is on protein supplements daily. He denies black stools, rectal bleeding. He is taking oral iron daily. He is on Protonix 40 mg daily. He denies any GI symptoms today  NSAIDs: none  Antiplts/Anticoagulants/Anti thrombotics: aspirin 81  GI Procedures:  Colonoscopy 2015 Diagnosis:  Part A: SIGMOID COLON COLD SNARE:  - HYPERPLASTIC POLYP.  - NEGATIVE FOR DYSPLASIA AND MALIGNANCY.  .  Part B: CECUM POLYP HOT AND COLD SNARE:  - TUBULAR ADENOMA.  - NEGATIVE FOR HIGH GRADE DYSPLASIA AND MALIGNANCY.  .  Part C:  SIGMOID POLYP COLD SNARE:  - TUBULAR ADENOMA, 2 FRAGMENTS, NEGATIVE FOR HIGH GRADE DYSPLASIA  AND MALIGNANCY.  - HYPERPLASTIC POLYP, 1 FRAGMENT, NEGATIVE FOR DYSPLASIA AND  MALIGNANCY.   EGD and colonoscopy 10/2017 The duodenal bulb and second portion of the duodenum were normal. Multiple dispersed, diminutive non-bleeding erosions were found in the gastric antrum. There were no stigmata of recent bleeding. Biopsies were taken with a cold forceps for Helicobacter pylori Testing. Pre-pyloric Stomach : Erosion (Gastropathy) The gastric fundus, gastric body, incisura, prepyloric region of the stomach and pylorus were normal. Biopsies were taken with a cold forceps for Helicobacter pylori testing. The gastroesophageal junction and examined esophagus were normal. DIAGNOSIS:  A. STOMACH, RANDOM; COLD BIOPSY:  - ANTRAL MUCOSA WITH REACTIVE GASTRITIS.  - UNREMARKABLE OXYNTIC MUCOSA.  - NEGATIVE FOR H. PYLORI, DYSPLASIA, AND MALIGNANCY.  DIAGNOSIS:  A. COLON POLYPS X2, HEPATIC FLEXURE; COLD BIOPSY AND COLD SNARE:  - TUBULAR ADENOMA (2).  - COLONIC MUCOSA WITH PROMINENT LYMPHOID AGGREGATE (1).  - NEGATIVE FOR HIGH-GRADE DYSPLASIA AND MALIGNANCY.   B. RECTUM POLYP; COLD SNARE:  - HYPERPLASTIC POLYP.  - NEGATIVE FOR DYSPLASIA AND MALIGNANCY  Past Medical History:  Diagnosis Date  . Carboxyhemoglobinemia   . COPD (chronic obstructive pulmonary disease) (HCC)    on 2.5L chronic home o2  . Polycythemia, secondary 10/20/2014  . Thrombocytopenia ([place])     Past Surgical History:  Procedure Laterality Date  . APPENDECTOMY    . COLONOSCOPY    . COLONOSCOPY WITH PROPOFOL N/A 11/05/2017   Procedure: COLONOSCOPY WITH PROPOFOL;  Surgeon: Patrick Landsman, MD;  Location:  [facility] ENDOSCOPY;  Service: Gastroenterology;  Laterality: N/A;  . ESOPHAGOGASTRODUODENOSCOPY    . ESOPHAGOGASTRODUODENOSCOPY N/A 11/04/2017   Procedure: ESOPHAGOGASTRODUODENOSCOPY (EGD);  Surgeon: Patrick Landsman, MD;   Location: [facility] ENDOSCOPY;  Service: Gastroenterology;  Laterality: N/A;  . EYE SURGERY Right    cataract  . HERNIA REPAIR    . polycythemia      Current Outpatient Medications:  .  acetaminophen (TYLENOL) 325 MG tablet, Take 2 tablets (650 mg total) by mouth every 6 (six) hours as needed for mild pain (or Fever >/= 101)., Disp: , Rfl:  .  albuterol (PROVENTIL HFA;VENTOLIN HFA) 108 (90 Base) MCG/ACT inhaler, Inhale 1-2 puffs into the lungs every 6 (six) hours as needed for wheezing or shortness of breath., Disp: 1 Inhaler, Rfl: 2 .  amLODipine (NORVASC) 5 MG tablet, Take 1 tablet (5 mg total) by mouth daily., Disp: 90 tablet, Rfl: 2 .  aspirin 81 MG tablet, ASA 81 mg po once a day (start taking it from 08/02/2013)   Quantity: 0;  Refills: 0  Active, Disp: , Rfl:  .  atorvastatin (LIPITOR) 20 MG tablet, TAKE 1 TABLET EVERY DAY FOR HYPERCHOLESTEROLEMIA, Disp: 90 tablet, Rfl: 4 .  benazepril (LOTENSIN) 40 MG tablet, Take 1 tablet (40 mg total) by mouth daily., Disp: 90 tablet, Rfl: 2 .  budesonide-formoterol (SYMBICORT) 160-4.5 MCG/ACT inhaler, Inhale 2 puffs into the lungs every morning. INHALE 2 TIMES DAILY, Disp: 3 Inhaler, Rfl: 4 .  docusate sodium (COLACE) 100 MG capsule, Take 1 capsule (100 mg total) by mouth 2 (two) times daily., Disp: 10 capsule, Rfl: 0 .  feeding supplement, ENSURE ENLIVE, (ENSURE ENLIVE) LIQD, Take 237 mLs by mouth 3 (three) times daily between meals., Disp: 90 Bottle, Rfl: 0 .  ferrous sulfate 325 (65 FE) MG tablet, Take 1 tablet (325 mg total) by mouth 2 (two) times daily with a meal., Disp: , Rfl: 3 .  Multiple Vitamin (MULTIVITAMIN WITH MINERALS) TABS tablet, Take 1 tablet by mouth daily., Disp: , Rfl:  .  pantoprazole (PROTONIX) 40 MG tablet, Take 1 tablet (40 mg total) by mouth daily before breakfast., Disp: 30 tablet, Rfl: 0 .  vitamin C (VITAMIN C) 250 MG tablet, Take 1 tablet (250 mg total) by mouth 2 (two) times daily., Disp: , Rfl:    Family History  Problem  Relation Age of Onset  . Cancer Mother   . Cancer Sister   . Cancer Brother      Social History   Tobacco Use  . Smoking status: Current Every Day Smoker    Packs/day: 0.50    Types: Cigarettes  . Smokeless tobacco: Never Used  . Tobacco comment: Smoking History 1(1)Packs per day  Substance Use Topics  . Alcohol use: Yes    Alcohol/week: 8.4 oz    Types: 14 Cans of beer per week    Comment: 2 cans of beer a night  . Drug use: No    Allergies as of 11/12/2017  . (No Known Allergies)    Review of Systems:    All systems reviewed and negative except where noted in HPI.   Physical Exam:  BP (!) 117/53   Pulse (!) 109   Ht _0  (1.753 m)   Wt 119 lb 12.8 oz (54.3 kg)   BMI 17.69 kg/m  No LMP for male patient.  General:   Alert, frail appearing, pleasant and cooperative, in NAD Head:  Normocephalic and atraumatic, bitemporal wasting. Eyes:  Sclera clear, no icterus.  Conjunctiva pink. Ears:  Normal auditory acuity. Nose:  No deformity, discharge, or lesions. Mouth:  No deformity or lesions,oropharynx pink & moist. Neck:  Supple; no masses or thyromegaly. Lungs:  Respirations even and unlabored.  Clear throughout to auscultation.   No wheezes, crackles, or rhonchi. No acute distress. Heart:  Regular rate and rhythm; no murmurs, clicks, rubs, or gallops. Abdomen:  Normal bowel sounds. Soft, scaphoid, non-tender and non-distended without masses, hepatosplenomegaly or hernias noted.  No guarding or rebound tenderness.   Rectal: Not performed Msk:  Symmetrical without gross deformities. Good, equal movement & strength bilaterally. Pulses:  Normal pulses noted. Extremities:  No clubbing or edema.  No cyanosis. Neurologic:  Alert and oriented x3;  grossly normal neurologically. Skin:  Intact without significant lesions or rashes. No jaundice. Lymph Nodes:  No significant cervical adenopathy. Psych:  Alert and cooperative. Normal mood and affect.  Imaging  Studies: Ultrasound abdomen unremarkable  Assessment and Plan:   Patrick Ayala is a 77 y.o. African-American male with chronic tobacco abuse, severe COPD on home O2, chronic secondary polycythemia, unexplained chronic thrombocytopenia, with no evidence of chronic liver disease here for hospital follow-up of acute normocytic anemia and melena. EGD revealed gastric erosions, negative for H. pylori and colonoscopy was fairly unremarkable.  Patient is gaining weight and currently asymptomatic. Defer further endoscopy evaluation at this time Follow-up with Dr. Mike Ayala  Follow up in 3 months   Patrick Darby, MD

## 2017-11-12 NOTE — Telephone Encounter (Signed)
Lipitor refill Last Refill:07/24/17 # *90** Last OV: 09/19/16 PCP: [place]

## 2017-11-12 NOTE — Progress Notes (Signed)
BP 130/60   Pulse 98   Temp 98.9 F (37.2 C) (Oral)   Ht _0  (1.753 m)   Wt 119 lb 8 oz (54.2 kg)   SpO2 90%   BMI 17.65 kg/m    Subjective:    Patient ID: Patrick Ayala, male    DOB: 16-May-1940, 77 y.o.   MRN: [id number]  HPI: Patrick Ayala is a 77 y.o. male  Chief Complaint  Patient presents with  . Hospitalization Follow-up    GI Bleed   Patient here today for hospital f/u for bloody stools. Was found to have several non-bleeding gastric ulcers on EGD and several benign polyps on colonoscopy. Now being followed by GI. No bloody stools since d/c. Feeling at usual state of health. Only medication change at discharge was he was to d/c his daily aspirin - pt said this was incorrect and that he was going to keep taking it.   Relevant past medical, surgical, family and social history reviewed and updated as indicated. Interim medical history since our last visit reviewed. Allergies and medications reviewed and updated.  Review of Systems  Per HPI unless specifically indicated above     Objective:    BP 130/60   Pulse 98   Temp 98.9 F (37.2 C) (Oral)   Ht _1  (1.753 m)   Wt 119 lb 8 oz (54.2 kg)   SpO2 90%   BMI 17.65 kg/m   Wt Readings from Last 3 Encounters:  11/12/17 119 lb 8 oz (54.2 kg)  11/12/17 119 lb 12.8 oz (54.3 kg)  11/07/17 110 lb 4.8 oz (50 kg)    Physical Exam  Constitutional: He appears well-developed. No distress.  Underweight  HENT:  Head: Atraumatic.  Eyes: Conjunctivae and EOM are normal.  Neck: Normal range of motion. Neck supple.  Cardiovascular: Normal rate and regular rhythm.  Pulmonary/Chest: Effort normal. No respiratory distress.  Abdominal: Soft. Bowel sounds are normal. He exhibits no distension. There is no tenderness.  Musculoskeletal: Normal range of motion.  Neurological: He is alert.  Skin: Skin is warm and dry. No rash noted.  Psychiatric: He has a normal mood and affect. His behavior is normal.  Nursing note and  vitals reviewed.   Results for orders placed or performed during the hospital encounter of 11/02/17  Comprehensive metabolic panel  Result Value Ref Range   Sodium 140 135 - 145 mmol/L   Potassium 3.9 3.5 - 5.1 mmol/L   Chloride 94 (L) 98 - 111 mmol/L   CO2 41 (H) 22 - 32 mmol/L   Glucose, Bld 218 (H) 70 - 99 mg/dL   BUN 15 8 - 23 mg/dL   Creatinine, Ser 0.59 (L) 0.61 - 1.24 mg/dL   Calcium 8.8 (L) 8.9 - 10.3 mg/dL   Total Protein 6.4 (L) 6.5 - 8.1 g/dL   Albumin 4.2 3.5 - 5.0 g/dL   AST 25 15 - 41 U/L   ALT 18 0 - 44 U/L   Alkaline Phosphatase 46 38 - 126 U/L   Total Bilirubin 0.7 0.3 - 1.2 mg/dL   GFR calc non Af Amer >60 >60 mL/min   GFR calc Af Amer >60 >60 mL/min   Anion gap 5 5 - 15  CBC  Result Value Ref Range   WBC 7.9 3.8 - 10.6 K/uL   RBC 3.18 (L) 4.40 - 5.90 MIL/uL   Hemoglobin 10.6 (L) 13.0 - 18.0 g/dL   HCT 31.8 (L) 40.0 - 52.0 %  MCV 100.0 80.0 - 100.0 fL   MCH 33.3 26.0 - 34.0 pg   MCHC 33.3 32.0 - 36.0 g/dL   RDW 15.0 (H) 11.5 - 14.5 %   Platelets 79 (L) 150 - 440 K/uL  Protime-INR  Result Value Ref Range   Prothrombin Time 12.8 11.4 - 15.2 seconds   INR 0.97   APTT  Result Value Ref Range   aPTT 29 24 - 36 seconds  Basic metabolic panel  Result Value Ref Range   Sodium 144 135 - 145 mmol/L   Potassium 3.7 3.5 - 5.1 mmol/L   Chloride 98 98 - 111 mmol/L   CO2 42 (H) 22 - 32 mmol/L   Glucose, Bld 87 70 - 99 mg/dL   BUN 14 8 - 23 mg/dL   Creatinine, Ser 0.58 (L) 0.61 - 1.24 mg/dL   Calcium 8.4 (L) 8.9 - 10.3 mg/dL   GFR calc non Af Amer >60 >60 mL/min   GFR calc Af Amer >60 >60 mL/min   Anion gap 4 (L) 5 - 15  CBC  Result Value Ref Range   WBC 7.1 3.8 - 10.6 K/uL   RBC 2.87 (L) 4.40 - 5.90 MIL/uL   Hemoglobin 9.6 (L) 13.0 - 18.0 g/dL   HCT 28.8 (L) 40.0 - 52.0 %   MCV 100.7 (H) 80.0 - 100.0 fL   MCH 33.4 26.0 - 34.0 pg   MCHC 33.2 32.0 - 36.0 g/dL   RDW 14.9 (H) 11.5 - 14.5 %   Platelets 62 (L) 150 - 440 K/uL  Vitamin B12  Result Value  Ref Range   Vitamin B-12 3,555 (H) 180 - 914 pg/mL  Hemoglobin and hematocrit, blood  Result Value Ref Range   Hemoglobin 9.7 (L) 13.0 - 18.0 g/dL   HCT 29.4 (L) 40.0 - 52.0 %  Hemoglobin and hematocrit, blood  Result Value Ref Range   Hemoglobin 9.1 (L) 13.0 - 18.0 g/dL   HCT 27.2 (L) 40.0 - 52.0 %  HIV antibody  Result Value Ref Range   HIV Screen 4th Generation wRfx Non Reactive Non Reactive  Hepatitis panel, acute  Result Value Ref Range   Hepatitis B Surface Ag Negative Negative   HCV Ab <0.1 0.0 - 0.9 s/co ratio   Hep A IgM Negative Negative   Hep B C IgM Negative Negative  CBC  Result Value Ref Range   WBC 5.6 3.8 - 10.6 K/uL   RBC 2.76 (L) 4.40 - 5.90 MIL/uL   Hemoglobin 9.1 (L) 13.0 - 18.0 g/dL   HCT 27.6 (L) 40.0 - 52.0 %   MCV 100.1 (H) 80.0 - 100.0 fL   MCH 33.1 26.0 - 34.0 pg   MCHC 33.1 32.0 - 36.0 g/dL   RDW 15.0 (H) 11.5 - 14.5 %   Platelets 58 (L) 150 - 440 K/uL  CBC  Result Value Ref Range   WBC 6.3 3.8 - 10.6 K/uL   RBC 2.70 (L) 4.40 - 5.90 MIL/uL   Hemoglobin 9.0 (L) 13.0 - 18.0 g/dL   HCT 26.8 (L) 40.0 - 52.0 %   MCV 99.0 80.0 - 100.0 fL   MCH 33.1 26.0 - 34.0 pg   MCHC 33.5 32.0 - 36.0 g/dL   RDW 14.6 (H) 11.5 - 14.5 %   Platelets 55 (L) 150 - 440 K/uL  CBC  Result Value Ref Range   WBC 4.9 3.8 - 10.6 K/uL   RBC 2.47 (L) 4.40 - 5.90 MIL/uL   Hemoglobin 8.1 (  L) 13.0 - 18.0 g/dL   HCT 24.2 (L) 40.0 - 52.0 %   MCV 97.9 80.0 - 100.0 fL   MCH 33.0 26.0 - 34.0 pg   MCHC 33.7 32.0 - 36.0 g/dL   RDW 14.7 (H) 11.5 - 14.5 %   Platelets 49 (L) 150 - 440 K/uL  CBC  Result Value Ref Range   WBC 4.1 3.8 - 10.6 K/uL   RBC 2.66 (L) 4.40 - 5.90 MIL/uL   Hemoglobin 8.8 (L) 13.0 - 18.0 g/dL   HCT 25.9 (L) 40.0 - 52.0 %   MCV 97.3 80.0 - 100.0 fL   MCH 33.0 26.0 - 34.0 pg   MCHC 33.9 32.0 - 36.0 g/dL   RDW 14.4 11.5 - 14.5 %   Platelets 61 (L) 150 - 440 K/uL  Type and screen [place]  Result Value Ref Range   ABO/RH(D) A POS      Antibody Screen NEG    Sample Expiration      11/05/2017 Performed at [facility], [address] [place], [place] [address]   Surgical pathology  Result Value Ref Range   SURGICAL PATHOLOGY      Surgical Pathology CASE: [id number] PATIENT: [facility] Surgical Pathology Report     SPECIMEN SUBMITTED: A. Stomach, random, r/t antral erosions; cbx  CLINICAL HISTORY: None provided  PRE-OPERATIVE DIAGNOSIS: Acute post hemorrhagic anemia, melena  POST-OPERATIVE DIAGNOSIS: Antral erosions     DIAGNOSIS: A. STOMACH, RANDOM; COLD BIOPSY: - ANTRAL MUCOSA WITH REACTIVE GASTRITIS. - UNREMARKABLE OXYNTIC MUCOSA. - NEGATIVE FOR H. PYLORI, DYSPLASIA, AND MALIGNANCY.   GROSS DESCRIPTION: A. Labeled: Cbx random gastric R/T antral erosions Received: In formalin Tissue fragment(s): 3 Size: 0.2-0.4 cm Description: Pink-tan fragments Entirely submitted in one cassette.    Final Diagnosis performed by Quay Burow, MD.   Electronically signed 11/06/2017 3:15:42PM The electronic signature indicates that the named Attending Pathologist has evaluated the specimen  Technical component performed at [facility], [address], [place], [place] [address] Lab: [phone] Dir: Rush Farmer, MD, MMM  Professional component performed at [facility], [facility], [place], [place], [place] [address] Lab: [phone] Dir: Dellia Nims. Reuel Derby, MD   Surgical pathology  Result Value Ref Range   SURGICAL PATHOLOGY      Surgical Pathology CASE: [id number] PATIENT: [facility] Surgical Pathology Report     SPECIMEN SUBMITTED: A. Colon polyps x2, hepatic flexure;cbx/cold snare B. Rectum polyp,colon;cold snare  CLINICAL HISTORY: None provided  PRE-OPERATIVE DIAGNOSIS: Melena  POST-OPERATIVE DIAGNOSIS: None provided.     DIAGNOSIS: A. COLON POLYPS X2, HEPATIC FLEXURE; COLD BIOPSY AND COLD SNARE: - TUBULAR ADENOMA (2). - COLONIC  MUCOSA WITH PROMINENT LYMPHOID AGGREGATE (1). - NEGATIVE FOR HIGH-GRADE DYSPLASIA AND MALIGNANCY.  B.  RECTUM POLYP; COLD SNARE: - HYPERPLASTIC POLYP. - NEGATIVE FOR DYSPLASIA AND MALIGNANCY   GROSS DESCRIPTION: A. Labeled: Hepatic flexure polyp cbx/cold snare x2 Received: In formalin Tissue fragment(s): Multiple Size: Aggregate, 2.0 x 0.5 x 0.2 cm Description: Tan fragments and fecal material Entirely submitted in one cassette.  B. Labeled: Rectal colon polyp cold snare Received: In formalin Tissue fragment(s): 1 Size: 0.5 cm Desc ription: Tan fragments with a small amount of fecal material Entirely submitted in one cassette.    Final Diagnosis performed by Quay Burow, MD.   Electronically signed 11/09/2017 11:12:08AM The electronic signature indicates that the named Attending Pathologist has evaluated the specimen  Technical component performed at [facility], [address] [place], [place] [address] Lab:  [phone] Dir: Rush Farmer, MD, MMM  Professional component performed at [facility], [facility], [place], [place], [place] [address] Lab: [phone] Dir: Dellia Nims. Rubinas, MD       Assessment & Plan:   Problem List Items Addressed This Visit      Cardiovascular and Mediastinum   Essential hypertension    Stable and WNL, continue current regimen      Relevant Medications   amLODipine (NORVASC) 5 MG tablet   benazepril (LOTENSIN) 40 MG tablet   atorvastatin (LIPITOR) 20 MG tablet     Digestive   Rectal bleeding - Primary    Resolved, now followed by GI as well. Encouraged him to d/c aspirin per instructions - pt declined. Declines CBC recheck today as well. Follow up with GI for any recurring sxs        Other   Hyperlipidemia    Declines labs today, last labs 6 months ago WNL. Continue current regimen      Relevant Medications   amLODipine (NORVASC) 5 MG tablet   benazepril (LOTENSIN) 40 MG tablet   atorvastatin (LIPITOR) 20  MG tablet       Follow up plan: Return for as scheduled.

## 2017-11-13 ENCOUNTER — Ambulatory Visit: Payer: Medicare HMO

## 2017-11-13 ENCOUNTER — Other Ambulatory Visit: Payer: Self-pay

## 2017-11-13 NOTE — Patient Outreach (Signed)
[facility]) Care Management  11/13/2017  WALDON SHEERIN [birth year] [id number]   EMMI- General Discharge RED ON EMMI ALERT Day # 1  Date:11/09/17 Red Alert Reason: Unfilled prescriptions? yes  Outreach attempt # 2 No answer.  HIPAA compliant voice message left.  RN CM will attempt patient again within 4 business days.  Jone Baseman, RN, MSN [facility] Care Management Coordinator Direct Line [phone] Cell [phone] Toll Free: [phone]  Fax: [phone]

## 2017-11-14 ENCOUNTER — Other Ambulatory Visit: Payer: Self-pay

## 2017-11-14 ENCOUNTER — Ambulatory Visit: Payer: Medicare HMO

## 2017-11-14 NOTE — Patient Outreach (Signed)
[facility]) Care Management  11/14/2017  ARIANNA DELSANTO [birth year] [id number]    EMMI-General Discharge RED ON EMMI ALERT Day #1 Date:11/09/17 Red Alert Reason:Unfilled prescriptions? yes  Outreach attempt #3 No answer. HIPAA compliant voice message left.   Plan: RN CM will wait return call.  If no return call will close case.    Jone Baseman, RN, MSN [place] [facility] Care Management Coordinator Direct Line [phone] Cell [phone] Toll Free: [phone]  Fax: [phone]

## 2017-11-16 NOTE — Assessment & Plan Note (Signed)
Resolved, now followed by GI as well. Encouraged him to d/c aspirin per instructions - pt declined. Declines CBC recheck today as well. Follow up with GI for any recurring sxs

## 2017-11-16 NOTE — Assessment & Plan Note (Signed)
Declines labs today, last labs 6 months ago WNL. Continue current regimen

## 2017-11-16 NOTE — Assessment & Plan Note (Signed)
Stable and WNL, continue current regimen

## 2017-11-16 NOTE — Patient Instructions (Signed)
Follow up as needed

## 2017-11-26 ENCOUNTER — Other Ambulatory Visit: Payer: Self-pay

## 2017-11-26 NOTE — Patient Outreach (Signed)
[facility]) Care Management  11/26/2017  Patrick Ayala 05-31-1940 [id number]   Multiple attempts to establish contact with patient without success. No response from letter mailed to patient.   Plan: RN CM will close case at this time.   Jone Baseman, RN, MSN [place] [facility] Care Management Coordinator Direct Line [phone] Cell [phone] Toll Free: [phone]  Fax: [phone]

## 2017-11-29 ENCOUNTER — Inpatient Hospital Stay: Payer: Medicare HMO | Attending: Hematology and Oncology

## 2017-11-29 DIAGNOSIS — E538 Deficiency of other specified B group vitamins: Secondary | ICD-10-CM | POA: Diagnosis not present

## 2017-11-29 MED ORDER — CYANOCOBALAMIN 1000 MCG/ML IJ SOLN
1000.0000 ug | Freq: Once | INTRAMUSCULAR | Status: AC
  Administered 2017-11-29: 1000 ug via INTRAMUSCULAR

## 2017-12-19 ENCOUNTER — Ambulatory Visit: Payer: Self-pay | Admitting: *Deleted

## 2017-12-19 NOTE — Telephone Encounter (Signed)
Pt called c/o that his feet and ankles have been swollen a little over a week. He denies pain or redness. He states that they are swollen even when he first gets up in the morning. No other symptoms, chest pain, shortness of breath. No pain when walking.  Appointment scheduled for Friday per protocol. Care advice given to pt regarding, elevating feet, hydrating and watching salt content in diet. Pt voiced understanding. He also is on oxygen at 2.5 liters and his oxygen tank can only last for about an hour. He wants to make sure that he is able to use oxygen if needed there. Will route to flow at [facility]. Reason for Disposition . Swollen ankle joint  (Exception: area of localized swelling which is itchy)  Answer Assessment - Initial Assessment Questions 1. LOCATION: "Which joint is swollen?"     ankle 2. ONSET: "When did the swelling start?"     Been over a week 3. SIZE: "How large is the swelling?"     Goes a little above the ankle 4. PAIN: "Is there any pain?" If so, ask: "How bad is it?" (Scale 1-10; or mild, moderate, severe)     no 5. CAUSE: "What do you think caused the swollen joint?"     no 6. OTHER SYMPTOMS: "Do you have any other symptoms?" (e.g., fever, chest pain, difficulty breathing, calf pain)     no  Protocols used: ANKLE SWELLING-A-AH

## 2017-12-21 ENCOUNTER — Ambulatory Visit: Payer: Self-pay | Admitting: Unknown Physician Specialty

## 2017-12-27 ENCOUNTER — Telehealth: Payer: Self-pay | Admitting: *Deleted

## 2017-12-27 ENCOUNTER — Inpatient Hospital Stay: Payer: Medicare HMO | Attending: Hematology and Oncology

## 2017-12-27 NOTE — Telephone Encounter (Signed)
Left message for patient to notify them that it is time to schedule annual low dose lung cancer screening CT scan. Instructed patient to call back to verify information prior to the scan being scheduled.

## 2018-01-09 ENCOUNTER — Telehealth: Payer: Self-pay | Admitting: *Deleted

## 2018-01-09 NOTE — Telephone Encounter (Signed)
Left message for patient to notify them that it is time to schedule annual low dose lung cancer screening CT scan. Instructed patient to call back to verify information prior to the scan being scheduled.  

## 2018-01-11 ENCOUNTER — Telehealth: Payer: Self-pay | Admitting: Family Medicine

## 2018-01-11 NOTE — Telephone Encounter (Signed)
Helene Kelp: Is this something that you need to do or does it need a peer to peer?

## 2018-01-11 NOTE — Telephone Encounter (Signed)
Not sure if this is a Peer to Peer or what's going on with this

## 2018-01-11 NOTE — Telephone Encounter (Signed)
Copied from [place] [id number]. Topic: General - Other >> Jan 11, 2018 12:19 PM Margot Ables wrote: Reason for CRM: Threasa Beards states pt is scheduled for CT on 01/17/18 and authorization needs to be obtained for [facility] thru [facility]. Please advise.

## 2018-01-17 ENCOUNTER — Ambulatory Visit: Admission: RE | Admit: 2018-01-17 | Payer: Medicare HMO | Source: Ambulatory Visit

## 2018-01-22 ENCOUNTER — Telehealth: Payer: Self-pay | Admitting: Nurse Practitioner

## 2018-01-23 ENCOUNTER — Encounter: Payer: Self-pay | Admitting: *Deleted

## 2018-01-29 ENCOUNTER — Inpatient Hospital Stay: Payer: Medicare HMO

## 2018-01-30 ENCOUNTER — Ambulatory Visit (INDEPENDENT_AMBULATORY_CARE_PROVIDER_SITE_OTHER): Payer: Medicare HMO | Admitting: Family Medicine

## 2018-01-30 ENCOUNTER — Encounter: Payer: Self-pay | Admitting: Family Medicine

## 2018-01-30 VITALS — BP 129/63 | HR 96 | Temp 97.5°F | Ht 69.0 in | Wt 111.4 lb

## 2018-01-30 DIAGNOSIS — N400 Enlarged prostate without lower urinary tract symptoms: Secondary | ICD-10-CM | POA: Diagnosis not present

## 2018-01-30 DIAGNOSIS — I700 Atherosclerosis of aorta: Secondary | ICD-10-CM

## 2018-01-30 DIAGNOSIS — I270 Primary pulmonary hypertension: Secondary | ICD-10-CM | POA: Diagnosis not present

## 2018-01-30 LAB — MICROSCOPIC EXAMINATION: Bacteria, UA: NONE SEEN

## 2018-01-30 LAB — URINALYSIS, ROUTINE W REFLEX MICROSCOPIC
Bilirubin, UA: NEGATIVE
Glucose, UA: NEGATIVE
KETONES UA: NEGATIVE
LEUKOCYTES UA: NEGATIVE
Nitrite, UA: NEGATIVE
PH UA: 6.5 (ref 5.0–7.5)
RBC UA: NEGATIVE
SPEC GRAV UA: 1.015 (ref 1.005–1.030)
Urobilinogen, Ur: 1 mg/dL (ref 0.2–1.0)

## 2018-01-30 MED ORDER — TAMSULOSIN HCL 0.4 MG PO CAPS: 0.4000 mg | ORAL_CAPSULE | Freq: Every day | ORAL | 3 refills | Status: DC

## 2018-01-30 NOTE — Progress Notes (Signed)
BP 129/63 (BP Location: Left Arm, Patient Position: Sitting, Cuff Size: Normal)   Pulse 96   Temp (!) 97.5 F (36.4 C) (Oral)   Ht _0  (1.753 m)   Wt 111 lb 6.4 oz (50.5 kg)   SpO2 (!) 79%   BMI 16.45 kg/m    Subjective:    Patient ID: Patrick Ayala, male    DOB: 1940/11/09, 77 y.o.   MRN: [id number]  HPI: Patrick Ayala is a 77 y.o. male  Chief Complaint  Patient presents with  . Check Kidney's   Patient accompanied by his daughter who assists with history. Patient having trouble starting stopping stream of urine with poor flow and history of BPH.  Has not taken BPH medications recently.  No burning urgency dysuria no noticed other changes in his urinary stream. Patient's oxygen was measured without his O2 on now has his O2 on and oxygen levels are stable.  Patient checking at home as 95 to 97% O2 sats.  Relevant past medical, surgical, family and social history reviewed and updated as indicated. Interim medical history since our last visit reviewed. Allergies and medications reviewed and updated.  Review of Systems  Constitutional: Negative.   Respiratory: Negative.   Cardiovascular: Negative.     Per HPI unless specifically indicated above     Objective:    BP 129/63 (BP Location: Left Arm, Patient Position: Sitting, Cuff Size: Normal)   Pulse 96   Temp (!) 97.5 F (36.4 C) (Oral)   Ht _1  (1.753 m)   Wt 111 lb 6.4 oz (50.5 kg)   SpO2 (!) 79%   BMI 16.45 kg/m   Wt Readings from Last 3 Encounters:  01/30/18 111 lb 6.4 oz (50.5 kg)  11/12/17 119 lb 8 oz (54.2 kg)  11/12/17 119 lb 12.8 oz (54.3 kg)    Physical Exam  Constitutional: He is oriented to person, place, and time. He appears well-developed and well-nourished.  HENT:  Head: Normocephalic and atraumatic.  Eyes: Conjunctivae and EOM are normal.  Neck: Normal range of motion.  Cardiovascular: Normal rate, regular rhythm and normal heart sounds.  Pulmonary/Chest: Effort normal and breath sounds  normal.  Musculoskeletal: Normal range of motion.  Neurological: He is alert and oriented to person, place, and time.  Skin: No erythema.  Psychiatric: He has a normal mood and affect. His behavior is normal. Judgment and thought content normal.    Results for orders placed or performed during the hospital encounter of 11/02/17  Comprehensive metabolic panel  Result Value Ref Range   Sodium 140 135 - 145 mmol/L   Potassium 3.9 3.5 - 5.1 mmol/L   Chloride 94 (L) 98 - 111 mmol/L   CO2 41 (H) 22 - 32 mmol/L   Glucose, Bld 218 (H) 70 - 99 mg/dL   BUN 15 8 - 23 mg/dL   Creatinine, Ser 0.59 (L) 0.61 - 1.24 mg/dL   Calcium 8.8 (L) 8.9 - 10.3 mg/dL   Total Protein 6.4 (L) 6.5 - 8.1 g/dL   Albumin 4.2 3.5 - 5.0 g/dL   AST 25 15 - 41 U/L   ALT 18 0 - 44 U/L   Alkaline Phosphatase 46 38 - 126 U/L   Total Bilirubin 0.7 0.3 - 1.2 mg/dL   GFR calc non Af Amer >60 >60 mL/min   GFR calc Af Amer >60 >60 mL/min   Anion gap 5 5 - 15  CBC  Result Value Ref Range   WBC 7.9  3.8 - 10.6 K/uL   RBC 3.18 (L) 4.40 - 5.90 MIL/uL   Hemoglobin 10.6 (L) 13.0 - 18.0 g/dL   HCT 31.8 (L) 40.0 - 52.0 %   MCV 100.0 80.0 - 100.0 fL   MCH 33.3 26.0 - 34.0 pg   MCHC 33.3 32.0 - 36.0 g/dL   RDW 15.0 (H) 11.5 - 14.5 %   Platelets 79 (L) 150 - 440 K/uL  Protime-INR  Result Value Ref Range   Prothrombin Time 12.8 11.4 - 15.2 seconds   INR 0.97   APTT  Result Value Ref Range   aPTT 29 24 - 36 seconds  Basic metabolic panel  Result Value Ref Range   Sodium 144 135 - 145 mmol/L   Potassium 3.7 3.5 - 5.1 mmol/L   Chloride 98 98 - 111 mmol/L   CO2 42 (H) 22 - 32 mmol/L   Glucose, Bld 87 70 - 99 mg/dL   BUN 14 8 - 23 mg/dL   Creatinine, Ser 0.58 (L) 0.61 - 1.24 mg/dL   Calcium 8.4 (L) 8.9 - 10.3 mg/dL   GFR calc non Af Amer >60 >60 mL/min   GFR calc Af Amer >60 >60 mL/min   Anion gap 4 (L) 5 - 15  CBC  Result Value Ref Range   WBC 7.1 3.8 - 10.6 K/uL   RBC 2.87 (L) 4.40 - 5.90 MIL/uL   Hemoglobin 9.6  (L) 13.0 - 18.0 g/dL   HCT 28.8 (L) 40.0 - 52.0 %   MCV 100.7 (H) 80.0 - 100.0 fL   MCH 33.4 26.0 - 34.0 pg   MCHC 33.2 32.0 - 36.0 g/dL   RDW 14.9 (H) 11.5 - 14.5 %   Platelets 62 (L) 150 - 440 K/uL  Vitamin B12  Result Value Ref Range   Vitamin B-12 3,555 (H) 180 - 914 pg/mL  Hemoglobin and hematocrit, blood  Result Value Ref Range   Hemoglobin 9.7 (L) 13.0 - 18.0 g/dL   HCT 29.4 (L) 40.0 - 52.0 %  Hemoglobin and hematocrit, blood  Result Value Ref Range   Hemoglobin 9.1 (L) 13.0 - 18.0 g/dL   HCT 27.2 (L) 40.0 - 52.0 %  HIV antibody  Result Value Ref Range   HIV Screen 4th Generation wRfx Non Reactive Non Reactive  Hepatitis panel, acute  Result Value Ref Range   Hepatitis B Surface Ag Negative Negative   HCV Ab <0.1 0.0 - 0.9 s/co ratio   Hep A IgM Negative Negative   Hep B C IgM Negative Negative  CBC  Result Value Ref Range   WBC 5.6 3.8 - 10.6 K/uL   RBC 2.76 (L) 4.40 - 5.90 MIL/uL   Hemoglobin 9.1 (L) 13.0 - 18.0 g/dL   HCT 27.6 (L) 40.0 - 52.0 %   MCV 100.1 (H) 80.0 - 100.0 fL   MCH 33.1 26.0 - 34.0 pg   MCHC 33.1 32.0 - 36.0 g/dL   RDW 15.0 (H) 11.5 - 14.5 %   Platelets 58 (L) 150 - 440 K/uL  CBC  Result Value Ref Range   WBC 6.3 3.8 - 10.6 K/uL   RBC 2.70 (L) 4.40 - 5.90 MIL/uL   Hemoglobin 9.0 (L) 13.0 - 18.0 g/dL   HCT 26.8 (L) 40.0 - 52.0 %   MCV 99.0 80.0 - 100.0 fL   MCH 33.1 26.0 - 34.0 pg   MCHC 33.5 32.0 - 36.0 g/dL   RDW 14.6 (H) 11.5 - 14.5 %   Platelets 55 (  L) 150 - 440 K/uL  CBC  Result Value Ref Range   WBC 4.9 3.8 - 10.6 K/uL   RBC 2.47 (L) 4.40 - 5.90 MIL/uL   Hemoglobin 8.1 (L) 13.0 - 18.0 g/dL   HCT 24.2 (L) 40.0 - 52.0 %   MCV 97.9 80.0 - 100.0 fL   MCH 33.0 26.0 - 34.0 pg   MCHC 33.7 32.0 - 36.0 g/dL   RDW 14.7 (H) 11.5 - 14.5 %   Platelets 49 (L) 150 - 440 K/uL  CBC  Result Value Ref Range   WBC 4.1 3.8 - 10.6 K/uL   RBC 2.66 (L) 4.40 - 5.90 MIL/uL   Hemoglobin 8.8 (L) 13.0 - 18.0 g/dL   HCT 25.9 (L) 40.0 - 52.0 %   MCV  97.3 80.0 - 100.0 fL   MCH 33.0 26.0 - 34.0 pg   MCHC 33.9 32.0 - 36.0 g/dL   RDW 14.4 11.5 - 14.5 %   Platelets 61 (L) 150 - 440 K/uL  Type and screen [facility]  Result Value Ref Range   ABO/RH(D) A POS    Antibody Screen NEG    Sample Expiration      11/05/2017 Performed at [facility], [address], [place], [place] [address]   Surgical pathology  Result Value Ref Range   SURGICAL PATHOLOGY      Surgical Pathology CASE: [id number] PATIENT: [facility] Surgical Pathology Report     SPECIMEN SUBMITTED: A. Stomach, random, r/t antral erosions; cbx  CLINICAL HISTORY: None provided  PRE-OPERATIVE DIAGNOSIS: Acute post hemorrhagic anemia, melena  POST-OPERATIVE DIAGNOSIS: Antral erosions     DIAGNOSIS: A. STOMACH, RANDOM; COLD BIOPSY: - ANTRAL MUCOSA WITH REACTIVE GASTRITIS. - UNREMARKABLE OXYNTIC MUCOSA. - NEGATIVE FOR H. PYLORI, DYSPLASIA, AND MALIGNANCY.   GROSS DESCRIPTION: A. Labeled: Cbx random gastric R/T antral erosions Received: In formalin Tissue fragment(s): 3 Size: 0.2-0.4 cm Description: Pink-tan fragments Entirely submitted in one cassette.    Final Diagnosis performed by Quay Burow, MD.   Electronically signed 11/06/2017 3:15:42PM The electronic signature indicates that the named Attending Pathologist has evaluated the specimen  Technical component performed at [facility], [address] [place], [place] [address] Lab: [phone] Dir: Rush Farmer, MD, MMM  Professional component performed at [facility], [facility], [place], [place], [place] [address] Lab: [phone] Dir: Dellia Nims. Reuel Derby, MD   Surgical pathology  Result Value Ref Range   SURGICAL PATHOLOGY      Surgical Pathology CASE: [id number] PATIENT: [facility] Surgical Pathology Report     SPECIMEN SUBMITTED: A. Colon polyps x2, hepatic flexure;cbx/cold snare B. Rectum polyp,colon;cold  snare  CLINICAL HISTORY: None provided  PRE-OPERATIVE DIAGNOSIS: Melena  POST-OPERATIVE DIAGNOSIS: None provided.     DIAGNOSIS: A. COLON POLYPS X2, HEPATIC FLEXURE; COLD BIOPSY AND COLD SNARE: - TUBULAR ADENOMA (2). - COLONIC MUCOSA WITH PROMINENT LYMPHOID AGGREGATE (1). - NEGATIVE FOR HIGH-GRADE DYSPLASIA AND MALIGNANCY.  B.  RECTUM POLYP; COLD SNARE: - HYPERPLASTIC POLYP. - NEGATIVE FOR DYSPLASIA AND MALIGNANCY   GROSS DESCRIPTION: A. Labeled: Hepatic flexure polyp cbx/cold snare x2 Received: In formalin Tissue fragment(s): Multiple Size: Aggregate, 2.0 x 0.5 x 0.2 cm Description: Tan fragments and fecal material Entirely submitted in one cassette.  B. Labeled: Rectal colon polyp cold snare Received: In formalin Tissue fragment(s): 1 Size: 0.5 cm Desc ription: Tan fragments with a small amount of fecal material Entirely submitted in one cassette.    Final Diagnosis performed by Quay Burow, MD.  Electronically signed 11/09/2017 11:12:08AM The electronic signature indicates that the named Attending Pathologist has evaluated the specimen  Technical component performed at [facility], [address], [place], [place] [address] Lab: [phone] Dir: Rush Farmer, MD, MMM  Professional component performed at [facility], [facility], [place], [place], [place] [address] Lab: [phone] Dir: Dellia Nims. Rubinas, MD       Assessment & Plan:   Problem List Items Addressed This Visit      Cardiovascular and Mediastinum   Aortic atherosclerosis ([place])    Stable no angina      Pulmonary hypertension, primary ([place])    Stable uses O2        Genitourinary   BPH (benign prostatic hyperplasia) - Primary    Urinalysis is clear With BPH symptoms will start tamsulosin discussed use and benefits if not better will consider urology referral.      Relevant Medications   tamsulosin (FLOMAX) 0.4 MG CAPS capsule   Other Relevant Orders    Urinalysis, Routine w reflex microscopic    Other Visit Diagnoses    Atherosclerosis of aorta ([place])   (Chronic)         Follow up plan: Return if symptoms worsen or fail to improve, for As scheduled.

## 2018-01-30 NOTE — Assessment & Plan Note (Signed)
Urinalysis is clear With BPH symptoms will start tamsulosin discussed use and benefits if not better will consider urology referral.

## 2018-01-30 NOTE — Assessment & Plan Note (Signed)
Stable no angina

## 2018-01-30 NOTE — Assessment & Plan Note (Signed)
Stable uses O2

## 2018-01-31 ENCOUNTER — Inpatient Hospital Stay: Payer: Medicare HMO | Attending: Hematology and Oncology

## 2018-02-12 ENCOUNTER — Other Ambulatory Visit: Payer: Self-pay

## 2018-02-12 ENCOUNTER — Emergency Department: Payer: Medicare HMO

## 2018-02-12 ENCOUNTER — Encounter: Payer: Self-pay | Admitting: Emergency Medicine

## 2018-02-12 ENCOUNTER — Emergency Department
Admission: EM | Admit: 2018-02-12 | Discharge: 2018-02-13 | Disposition: A | Discharge status: Other Acute Inpatient Hospital | Payer: Medicare HMO | Attending: Emergency Medicine | Admitting: Emergency Medicine

## 2018-02-12 DIAGNOSIS — F17210 Nicotine dependence, cigarettes, uncomplicated: Secondary | ICD-10-CM | POA: Insufficient documentation

## 2018-02-12 DIAGNOSIS — R0902 Hypoxemia: Secondary | ICD-10-CM | POA: Diagnosis not present

## 2018-02-12 DIAGNOSIS — J441 Chronic obstructive pulmonary disease with (acute) exacerbation: Secondary | ICD-10-CM | POA: Diagnosis not present

## 2018-02-12 DIAGNOSIS — R918 Other nonspecific abnormal finding of lung field: Secondary | ICD-10-CM | POA: Diagnosis not present

## 2018-02-12 DIAGNOSIS — R0602 Shortness of breath: Secondary | ICD-10-CM | POA: Diagnosis not present

## 2018-02-12 DIAGNOSIS — R0689 Other abnormalities of breathing: Secondary | ICD-10-CM | POA: Diagnosis not present

## 2018-02-12 DIAGNOSIS — I2510 Atherosclerotic heart disease of native coronary artery without angina pectoris: Secondary | ICD-10-CM | POA: Diagnosis not present

## 2018-02-12 DIAGNOSIS — Z7982 Long term (current) use of aspirin: Secondary | ICD-10-CM | POA: Diagnosis not present

## 2018-02-12 DIAGNOSIS — I10 Essential (primary) hypertension: Secondary | ICD-10-CM | POA: Insufficient documentation

## 2018-02-12 DIAGNOSIS — R0600 Dyspnea, unspecified: Secondary | ICD-10-CM | POA: Diagnosis present

## 2018-02-12 DIAGNOSIS — Z79899 Other long term (current) drug therapy: Secondary | ICD-10-CM | POA: Insufficient documentation

## 2018-02-12 DIAGNOSIS — J189 Pneumonia, unspecified organism: Secondary | ICD-10-CM | POA: Diagnosis not present

## 2018-02-12 LAB — CBC
HEMATOCRIT: 44.8 % (ref 39.0–52.0)
Hemoglobin: 14 g/dL (ref 13.0–17.0)
MCH: 32.1 pg (ref 26.0–34.0)
MCHC: 31.3 g/dL (ref 30.0–36.0)
MCV: 102.8 fL — AB (ref 80.0–100.0)
NRBC: 0.5 % — AB (ref 0.0–0.2)
Platelets: 126 10*3/uL — ABNORMAL LOW (ref 150–400)
RBC: 4.36 MIL/uL (ref 4.22–5.81)
RDW: 15 % (ref 11.5–15.5)
WBC: 7.6 10*3/uL (ref 4.0–10.5)

## 2018-02-12 LAB — COMPREHENSIVE METABOLIC PANEL
ALBUMIN: 4.5 g/dL (ref 3.5–5.0)
ALK PHOS: 60 U/L (ref 38–126)
ALT: 28 U/L (ref 0–44)
AST: 35 U/L (ref 15–41)
Anion gap: 12 (ref 5–15)
BILIRUBIN TOTAL: 0.9 mg/dL (ref 0.3–1.2)
BUN: 34 mg/dL — AB (ref 8–23)
CALCIUM: 9.4 mg/dL (ref 8.9–10.3)
CO2: 37 mmol/L — ABNORMAL HIGH (ref 22–32)
CREATININE: 0.56 mg/dL — AB (ref 0.61–1.24)
Chloride: 85 mmol/L — ABNORMAL LOW (ref 98–111)
GFR calc Af Amer: >60 mL/min (ref >60)
GFR calc non Af Amer: >60 mL/min (ref >60)
GLUCOSE: 113 mg/dL — AB (ref 70–99)
Potassium: 5.4 mmol/L — ABNORMAL HIGH (ref 3.5–5.1)
Sodium: 134 mmol/L — ABNORMAL LOW (ref 135–145)
TOTAL PROTEIN: 7.9 g/dL (ref 6.5–8.1)

## 2018-02-12 LAB — BLOOD GAS, ARTERIAL
FIO2: 0.44
PATIENT TEMPERATURE: 37
PO2 ART: 89 mmHg (ref 83.0–108.0)
pCO2 arterial: 120 mmHg (ref 32.0–48.0)
pH, Arterial: 7.19 — CL (ref 7.350–7.450)

## 2018-02-12 LAB — BRAIN NATRIURETIC PEPTIDE: B Natriuretic Peptide: 839 pg/mL — ABNORMAL HIGH (ref 0.0–100.0)

## 2018-02-12 LAB — TROPONIN I: TROPONIN I: 0.05 ng/mL — AB (ref <0.03)

## 2018-02-12 MED ORDER — IPRATROPIUM-ALBUTEROL 0.5-2.5 (3) MG/3ML IN SOLN
3.0000 mL | Freq: Once | RESPIRATORY_TRACT | Status: AC
  Administered 2018-02-12: 3 mL via RESPIRATORY_TRACT

## 2018-02-12 MED ORDER — METHYLPREDNISOLONE SODIUM SUCC 125 MG IJ SOLR
125.0000 mg | Freq: Once | INTRAMUSCULAR | Status: AC
  Administered 2018-02-12: 125 mg via INTRAVENOUS

## 2018-02-12 MED ORDER — METHYLPREDNISOLONE SODIUM SUCC 125 MG IJ SOLR
INTRAMUSCULAR | Status: AC
  Filled 2018-02-12: qty 2

## 2018-02-12 MED ORDER — IPRATROPIUM-ALBUTEROL 0.5-2.5 (3) MG/3ML IN SOLN
RESPIRATORY_TRACT | Status: AC
  Administered 2018-02-12: 3 mL via RESPIRATORY_TRACT
  Filled 2018-02-12: qty 9

## 2018-02-12 MED ORDER — IOPAMIDOL (ISOVUE-370) INJECTION 76%
75.0000 mL | Freq: Once | INTRAVENOUS | Status: AC | PRN
  Administered 2018-02-12: 75 mL via INTRAVENOUS

## 2018-02-12 NOTE — ED Notes (Signed)
Patient transported to CT 

## 2018-02-12 NOTE — ED Notes (Signed)
Respiratory at bedside.

## 2018-02-12 NOTE — ED Notes (Signed)
Pt finished breathing tx. Pt switched to 6L nasal cannula. Pt oxygen saturation at 96% currently. MD notified. RN will continue to monitor.

## 2018-02-12 NOTE — ED Notes (Signed)
Pt unable to maintain oxygen saturation WDL while on 6L nasal cannula.  Pt placed back on BiPAP at this time.  EDP notified and to bedside.

## 2018-02-12 NOTE — ED Triage Notes (Signed)
Pt reports that he has been getting more and more weak, dizzy and SOB over the last week. Pt is on home O2 2L [place] at all times.

## 2018-02-12 NOTE — ED Provider Notes (Addendum)
[facility] Emergency Department Provider Note  Time seen: 5:37 PM  I have reviewed the triage vital signs and the nursing notes.   HISTORY  Chief Complaint Shortness of Breath and Weakness    HPI Patrick Ayala is a 77 y.o. male with a past medical history of COPD on 2 L chronically, polycythemia, thrombocytopenia, history of GI bleed, pulmonary hypertension, presents to the emergency department for dyspnea.  According to the patient and family for the past 1 week the patient has been feeling progressively more short of breath.  Denies any chest pain.  States her shortness of breath is worse with exertion, actually denies any shortness of breath currently while at rest.  No leg pain or swelling.  No abdominal pain, no recent illness, fever, cough, congestion, vomiting or diarrhea.  Largely negative review of systems otherwise.   Past Medical History:  Diagnosis Date  . Carboxyhemoglobinemia   . COPD (chronic obstructive pulmonary disease) (HCC)    on 2.5L chronic home o2  . Polycythemia, secondary 10/20/2014  . Thrombocytopenia [facility])     Patient Active Problem List   Diagnosis Date Noted  . Protein-calorie malnutrition, severe 11/06/2017  . Rectal bleeding   . Benign neoplasm of transverse colon   . Rectal polyp   . Ulcer of anus and rectum   . GI bleed 11/02/2017  . Pressure injury of skin 09/22/2017  . COPD exacerbation ([place]) 09/21/2017  . Malnutrition ([place]) 09/21/2017  . Multiple pulmonary nodules 10/16/2016  . Advanced care planning/counseling discussion 09/19/2016  . Aortic atherosclerosis ([place]) 07/10/2016  . Pulmonary hypertension, primary ([place]) 07/10/2016  . BPH (benign prostatic hyperplasia) 07/19/2015  . Cataracts, bilateral 06/28/2015  . Essential hypertension 02/01/2015  . Hyperlipidemia 02/01/2015  . COPD (chronic obstructive pulmonary disease) ([place]) 02/01/2015  . B12 deficiency 01/27/2015  . Personal history of tobacco use, presenting  hazards to health 12/21/2014  . Weight loss 12/07/2014  . Thrombocytopenia ([place]) 11/02/2014  . Polycythemia, secondary 10/20/2014    Past Surgical History:  Procedure Laterality Date  . APPENDECTOMY    . COLONOSCOPY    . COLONOSCOPY WITH PROPOFOL N/A 11/05/2017   Procedure: COLONOSCOPY WITH PROPOFOL;  Surgeon: Lin Landsman, MD;  Location: [facility] ENDOSCOPY;  Service: Gastroenterology;  Laterality: N/A;  . ESOPHAGOGASTRODUODENOSCOPY    . ESOPHAGOGASTRODUODENOSCOPY N/A 11/04/2017   Procedure: ESOPHAGOGASTRODUODENOSCOPY (EGD);  Surgeon: Lin Landsman, MD;  Location: [facility] ENDOSCOPY;  Service: Gastroenterology;  Laterality: N/A;  . EYE SURGERY Right    cataract  . HERNIA REPAIR    . polycythemia      Prior to Admission medications   Medication Sig Start Date End Date Taking? Authorizing Provider  acetaminophen (TYLENOL) 325 MG tablet Take 2 tablets (650 mg total) by mouth every 6 (six) hours as needed for mild pain (or Fever >/= 101). 11/07/17   Gouru, Aruna, MD  albuterol (PROVENTIL HFA;VENTOLIN HFA) 108 (90 Base) MCG/ACT inhaler Inhale 1-2 puffs into the lungs every 6 (six) hours as needed for wheezing or shortness of breath. 09/19/16   Guadalupe Maple, MD  amLODipine (NORVASC) 5 MG tablet Take 1 tablet (5 mg total) by mouth daily. 11/12/17   Volney American, PA-C  aspirin 81 MG tablet ASA 81 mg po once a day (start taking it from 08/02/2013)   Quantity: 0;  Refills: 0  Active    [provider]  atorvastatin (LIPITOR) 20 MG tablet TAKE 1 TABLET EVERY DAY FOR HYPERCHOLESTEROLEMIA 11/12/17   Volney American,  PA-C  benazepril (LOTENSIN) 40 MG tablet Take 1 tablet (40 mg total) by mouth daily. 11/12/17   Volney American, PA-C  budesonide-formoterol [facility]) 160-4.5 MCG/ACT inhaler Inhale 2 puffs into the lungs every morning. INHALE 2 TIMES DAILY 11/07/17   Gouru, Illene Silver, MD  docusate sodium (COLACE) 100 MG capsule Take 1 capsule (100 mg total) by mouth 2 (two)  times daily. 11/07/17   Gouru, Illene Silver, MD  feeding supplement, ENSURE ENLIVE, (ENSURE ENLIVE) LIQD Take 237 mLs by mouth 3 (three) times daily between meals. 11/07/17   Nicholes Mango, MD  ferrous sulfate 325 (65 FE) MG tablet Take 1 tablet (325 mg total) by mouth 2 (two) times daily with a meal. 11/07/17   Gouru, Aruna, MD  Multiple Vitamin (MULTIVITAMIN WITH MINERALS) TABS tablet Take 1 tablet by mouth daily. 11/08/17   Nicholes Mango, MD  pantoprazole (PROTONIX) 40 MG tablet Take 1 tablet (40 mg total) by mouth daily before breakfast. 11/08/17   Gouru, Illene Silver, MD  tamsulosin (FLOMAX) 0.4 MG CAPS capsule Take 1 capsule (0.4 mg total) by mouth daily. 01/30/18   Guadalupe Maple, MD  vitamin C (VITAMIN C) 250 MG tablet Take 1 tablet (250 mg total) by mouth 2 (two) times daily. 11/07/17   Nicholes Mango, MD    No Known Allergies  Family History  Problem Relation Age of Onset  . Cancer Mother   . Cancer Sister   . Cancer Brother     Social History Social History   Tobacco Use  . Smoking status: Current Every Day Smoker    Packs/day: 0.50    Types: Cigarettes  . Smokeless tobacco: Never Used  . Tobacco comment: Smoking History 1(1)Packs per day  Substance Use Topics  . Alcohol use: Yes    Alcohol/week: 14.0 standard drinks    Types: 14 Cans of beer per week    Comment: 2 cans of beer a night  . Drug use: No    Review of Systems Constitutional: Negative for fever. ENT: Negative for recent illness/congestion Cardiovascular: Negative for chest pain. Respiratory: Shortness of breath x1 week Gastrointestinal: Negative for abdominal pain, vomiting and diarrhea. Genitourinary: Negative for urinary compaints Musculoskeletal: Negative for musculoskeletal complaints Skin: Negative for skin complaints  Neurological: Negative for headache All other ROS negative  ____________________________________________   PHYSICAL EXAM:  VITAL SIGNS: ED Triage Vitals  Enc Vitals Group     BP 02/12/18 1723  (!) 101/47     Pulse Rate 02/12/18 1723 93     Resp 02/12/18 1723 18     Temp 02/12/18 1723 97.7 F (36.5 C)     Temp Source 02/12/18 1723 Oral     SpO2 02/12/18 1723 (!) 55 %     Weight 02/12/18 1726 111 lb 6.4 oz (50.5 kg)     Height 02/12/18 1726 _0  (1.753 m)     Head Circumference --      Peak Flow --      Pain Score 02/12/18 1726 0     Pain Loc --      Pain Edu? --      Excl. in [place]? --    Constitutional: Alert and oriented. Well appearing and in no distress. Eyes: Normal exam ENT   Head: Normocephalic and atraumatic   Mouth/Throat: Mucous membranes are moist. Cardiovascular: Normal rate, regular rhythm. No murmur Respiratory: Normal respiratory effort without tachypnea.  However the patient has significant diminished breath sounds bilaterally with expiratory wheeze. Gastrointestinal: Soft and nontender. No distention.  Musculoskeletal: Nontender with normal range of motion in all extremities.  Neurologic:  Normal speech and language. No gross focal neurologic deficits  Skin:  Skin is warm, dry and intact.  Psychiatric: Mood and affect are normal.   ____________________________________________    EKG  EKG reviewed and interpreted by myself shows a sinus rhythm at 94 bpm with a narrow QRS, normal axis, largely normal intervals nonspecific ST changes.  Do not agree with computer interpreted myocardial infarction.  ____________________________________________    RADIOLOGY  Chest x-ray shows severe emphysema.  No acute abnormality noted.  ____________________________________________   INITIAL IMPRESSION / ASSESSMENT AND PLAN / ED COURSE  Pertinent labs & imaging results that were available during my care of the patient were reviewed by me and considered in my medical decision making (see chart for details).  Patient presents emergency department for shortness of breath x1 week progressively worsening worse with exertion.  Differential would include COPD  exacerbation, ACS, pneumonia, pneumothorax, pulmonary edema.  We will check labs, chest x-ray, treat with DuoNeb x3, Solu-Medrol and continue to closely monitor.  Patient agreeable to plan of care.  Currently in no distress however has a great waveform satting 44% on 2 L which increased to 96% on a nonrebreather.  Patient has very diminished breath sounds bilaterally with expiratory wheeze bilaterally.  Patient is ABG resulted with a PCO2 of 120.  Although the patient is mentating very well we will place on BiPAP to help blow off CO2.  Patient tolerated BiPAP well.  BiPAP was discontinued after approximately 1 hour.  Patient now satting well on 6 L nasal cannula satting 95% currently.  Will admit to the hospital for further COPD treatment.  Patient desatted to 55% with a great waveform on 6 L of oxygen.  Placed back on BiPAP.  Satting 99% on BiPAP currently with a good waveform.  Patient will require BiPAP treatment.  We do not have any ICU beds available unfortunately we will discuss with [facility] which is the wife's preference for transfer.   I discussed the patient with [facility].  They have tentatively accepted the patient to their service however they did not know of their current bed availability.  They recommended obtaining a CT angiography given the patient's significant hypoxia.  I believe this would be beneficial for the patient we will proceed with CT angiography at this time.  We will update [facility] once a CT angiography has resulted.  CTA negative for PE.  Partial consolidations in bilateral lower lobes may reflect atelectasis versus pneumonia versus aspiration.  Given the patient's continued hypoxia we will check blood cultures and start on IV antibiotics.  [facility] is just arrived for the patient.  I updated them on the CT angiography findings.  We will obtain blood cultures while they are loading the patient they will hang antibiotics in route to the hospital.    CRITICAL CARE Performed by: Harvest Dark   Total critical care time: 45 minutes  Critical care time was exclusive of separately billable procedures and treating other patients.  Critical care was necessary to treat or prevent imminent or life-threatening deterioration.  Critical care was time spent personally by me on the following activities: development of treatment plan with patient and/or surrogate as well as nursing, discussions with consultants, evaluation of patient's response to treatment, examination of patient, obtaining history from patient or surrogate, ordering and performing treatments and interventions, ordering and review of laboratory studies, ordering and review of radiographic studies, pulse oximetry and  re-evaluation of patient's condition.   ____________________________________________   FINAL CLINICAL IMPRESSION(S) / ED DIAGNOSES  Dyspnea Hypoxia COPD exacerbation Pneumonia   Harvest Dark, MD 02/12/18 Sunday Shams, MD 02/13/18 Phillip Heal    Harvest Dark, MD 02/13/18 [phone]

## 2018-02-12 NOTE — ED Notes (Signed)
Date and time results received: 02/12/18 6:29 PM    Test: Troponin Critical Value: 0.05  Name of Provider Notified: Dr. Kerman Passey  Orders Received? Or Actions Taken?: Orders Received - See Orders for details

## 2018-02-12 NOTE — ED Notes (Signed)
Pt oxygen saturation at 52% on 3L. Dr. Dorinda Hill at bedside. Pt placed on 15L non-rebreather. Oxygen saturation at 95% currently. RN will continue to monitor.

## 2018-02-12 NOTE — ED Notes (Signed)
Pt taken off of BiPAP to trial per EDP request.  Pt placed on 6L nasal cannula.

## 2018-02-13 ENCOUNTER — Ambulatory Visit: Payer: Medicare HMO | Admitting: Gastroenterology

## 2018-02-13 ENCOUNTER — Encounter: Payer: Self-pay | Admitting: Gastroenterology

## 2018-02-13 DIAGNOSIS — R579 Shock, unspecified: Secondary | ICD-10-CM | POA: Diagnosis not present

## 2018-02-13 DIAGNOSIS — F17210 Nicotine dependence, cigarettes, uncomplicated: Secondary | ICD-10-CM | POA: Diagnosis not present

## 2018-02-13 DIAGNOSIS — I2781 Cor pulmonale (chronic): Secondary | ICD-10-CM | POA: Insufficient documentation

## 2018-02-13 DIAGNOSIS — R0689 Other abnormalities of breathing: Secondary | ICD-10-CM | POA: Diagnosis not present

## 2018-02-13 DIAGNOSIS — R0902 Hypoxemia: Secondary | ICD-10-CM | POA: Diagnosis not present

## 2018-02-13 DIAGNOSIS — I214 Non-ST elevation (NSTEMI) myocardial infarction: Secondary | ICD-10-CM | POA: Insufficient documentation

## 2018-02-13 DIAGNOSIS — E785 Hyperlipidemia, unspecified: Secondary | ICD-10-CM | POA: Diagnosis not present

## 2018-02-13 DIAGNOSIS — R918 Other nonspecific abnormal finding of lung field: Secondary | ICD-10-CM | POA: Diagnosis not present

## 2018-02-13 DIAGNOSIS — I2721 Secondary pulmonary arterial hypertension: Secondary | ICD-10-CM | POA: Diagnosis not present

## 2018-02-13 DIAGNOSIS — I272 Pulmonary hypertension, unspecified: Secondary | ICD-10-CM | POA: Diagnosis not present

## 2018-02-13 DIAGNOSIS — E43 Unspecified severe protein-calorie malnutrition: Secondary | ICD-10-CM | POA: Diagnosis not present

## 2018-02-13 DIAGNOSIS — E875 Hyperkalemia: Secondary | ICD-10-CM | POA: Diagnosis not present

## 2018-02-13 DIAGNOSIS — N400 Enlarged prostate without lower urinary tract symptoms: Secondary | ICD-10-CM | POA: Diagnosis not present

## 2018-02-13 DIAGNOSIS — Z7951 Long term (current) use of inhaled steroids: Secondary | ICD-10-CM | POA: Diagnosis not present

## 2018-02-13 DIAGNOSIS — I959 Hypotension, unspecified: Secondary | ICD-10-CM | POA: Diagnosis not present

## 2018-02-13 DIAGNOSIS — R0602 Shortness of breath: Secondary | ICD-10-CM | POA: Diagnosis not present

## 2018-02-13 DIAGNOSIS — R634 Abnormal weight loss: Secondary | ICD-10-CM

## 2018-02-13 DIAGNOSIS — J189 Pneumonia, unspecified organism: Secondary | ICD-10-CM | POA: Insufficient documentation

## 2018-02-13 DIAGNOSIS — J9622 Acute and chronic respiratory failure with hypercapnia: Secondary | ICD-10-CM | POA: Diagnosis not present

## 2018-02-13 DIAGNOSIS — J9612 Chronic respiratory failure with hypercapnia: Secondary | ICD-10-CM | POA: Diagnosis not present

## 2018-02-13 DIAGNOSIS — Z452 Encounter for adjustment and management of vascular access device: Secondary | ICD-10-CM | POA: Diagnosis not present

## 2018-02-13 DIAGNOSIS — J439 Emphysema, unspecified: Secondary | ICD-10-CM | POA: Diagnosis not present

## 2018-02-13 DIAGNOSIS — J440 Chronic obstructive pulmonary disease with acute lower respiratory infection: Secondary | ICD-10-CM | POA: Diagnosis not present

## 2018-02-13 DIAGNOSIS — I350 Nonrheumatic aortic (valve) stenosis: Secondary | ICD-10-CM | POA: Insufficient documentation

## 2018-02-13 DIAGNOSIS — D649 Anemia, unspecified: Secondary | ICD-10-CM | POA: Diagnosis not present

## 2018-02-13 DIAGNOSIS — G92 Toxic encephalopathy: Secondary | ICD-10-CM | POA: Insufficient documentation

## 2018-02-13 DIAGNOSIS — Z79899 Other long term (current) drug therapy: Secondary | ICD-10-CM | POA: Diagnosis not present

## 2018-02-13 DIAGNOSIS — Z7982 Long term (current) use of aspirin: Secondary | ICD-10-CM | POA: Diagnosis not present

## 2018-02-13 DIAGNOSIS — I10 Essential (primary) hypertension: Secondary | ICD-10-CM | POA: Diagnosis not present

## 2018-02-13 DIAGNOSIS — G928 Other toxic encephalopathy: Secondary | ICD-10-CM | POA: Insufficient documentation

## 2018-02-13 DIAGNOSIS — G9340 Encephalopathy, unspecified: Secondary | ICD-10-CM | POA: Diagnosis not present

## 2018-02-13 DIAGNOSIS — J441 Chronic obstructive pulmonary disease with (acute) exacerbation: Secondary | ICD-10-CM | POA: Diagnosis not present

## 2018-02-13 DIAGNOSIS — R578 Other shock: Secondary | ICD-10-CM | POA: Diagnosis not present

## 2018-02-13 DIAGNOSIS — R9431 Abnormal electrocardiogram [ECG] [EKG]: Secondary | ICD-10-CM | POA: Diagnosis not present

## 2018-02-13 DIAGNOSIS — I2510 Atherosclerotic heart disease of native coronary artery without angina pectoris: Secondary | ICD-10-CM | POA: Diagnosis not present

## 2018-02-13 DIAGNOSIS — J449 Chronic obstructive pulmonary disease, unspecified: Secondary | ICD-10-CM | POA: Diagnosis not present

## 2018-02-13 DIAGNOSIS — J9621 Acute and chronic respiratory failure with hypoxia: Secondary | ICD-10-CM | POA: Diagnosis not present

## 2018-02-13 MED ORDER — VANCOMYCIN HCL IN DEXTROSE 1-5 GM/200ML-% IV SOLN
1000.0000 mg | Freq: Once | INTRAVENOUS | Status: AC
  Administered 2018-02-13: 1000 mg via INTRAVENOUS
  Filled 2018-02-13: qty 200

## 2018-02-13 MED ORDER — PIPERACILLIN-TAZOBACTAM 3.375 G IVPB 30 MIN
3.3750 g | Freq: Once | INTRAVENOUS | Status: AC
  Administered 2018-02-13: 3.375 g via INTRAVENOUS
  Filled 2018-02-13: qty 50

## 2018-02-13 MED ORDER — LEVOFLOXACIN IN D5W 750 MG/150ML IV SOLN: 750.0000 mg | Freq: Once | INTRAVENOUS | Status: DC

## 2018-02-13 NOTE — ED Notes (Signed)
[facility] [facility] is here to transport the Pt.

## 2018-02-18 LAB — CULTURE, BLOOD (ROUTINE X 2)
CULTURE: NO GROWTH
Culture: NO GROWTH
SPECIAL REQUESTS: ADEQUATE
Special Requests: ADEQUATE

## 2018-02-23 LAB — BLOOD GAS, ARTERIAL
Bicarbonate: UNDETERMINED mmol/L (ref 20.0–28.0)
DELIVERY SYSTEMS: POSITIVE
Expiratory PAP: 5
FIO2: 0.55
INSPIRATORY PAP: 12
LHR: 10 {breaths}/min
O2 Saturation: UNDETERMINED %
PCO2 ART: 120 mmHg — AB (ref 32.0–48.0)
PH ART: 7.14 — AB (ref 7.350–7.450)
Patient temperature: 37
pO2, Arterial: 72 mmHg — ABNORMAL LOW (ref 83.0–108.0)

## 2018-03-04 ENCOUNTER — Inpatient Hospital Stay: Payer: Medicare HMO | Admitting: Hematology and Oncology

## 2018-03-04 ENCOUNTER — Inpatient Hospital Stay: Payer: Medicare HMO

## 2018-03-04 NOTE — Progress Notes (Deleted)
[facility] day:  03/04/18  Chief Complaint: Patrick Ayala is a 77 y.o. male with secondary polycythemia, thrombocytopenia and B12 deficiency who is seen for 6 month assessment.  HPI: The patient was last seen in the medical oncology clinic on 09/06/2017.  At that time, he denied any new complaints. He had chronic shortness of breath on supplemental oxygen.  Exam was stable.  Hematocrit was 40.4.  Platelet count was 86,000.  Ferritin was 173.  Folate was 34.  He received B12 on 09/06/2017, 11/01/2017, and 11/29/2017.  He was admitted to [facility] from 11/02/2017 - 11/07/2017 with rectal bleeding.  He presented with black tarry stools mixed with blood. EGD on 11/04/2017 revealed multiple dispersed, diminutive non-bleeding erosions were found in the gastric antrum. There were no stigmata of recent bleeding. Biopsies were taken.  Colonoscopy on 11/05/2017 revealed polyps.  No bleeding source was identified.  Hemoglobin was followed: 9.0--8.1--8.8.  GI recommended a bleeding scan if patient profusely bleed.  He was started on iron supplements and stool softeners.  He was seen in the [facility] ER on 02/12/2018 with shortness of breath and weakness.  CXR showed severe emphysema.  Chest CT angiogram revealed no evidence of pulmonary embolism.  There were partial consolidations in the bilateral lower lobes which may reflect ATX vs pneumonia vs aspiration.O2 sats were 55%.  He was treated with DuoNeb x 3, solumedrol.  He was treated with IV antibiotics and BiPAP.  Initial ABG revealed a pH of 7.19 with a pCO2 > 120.  No ICU beds were available.  He was transported to [facility].  He was admitted to [facility] from 02/13/2018 - 02/19/2018.  He was treated with BiPAP with back up rate, and his acidosis and hypercarbia improved. Patient was treated with 5 days of Cefdinir, Azithromycin, and Prednisone with improvement in his respiratory status. He was able to be weaned off daytime BiPAP to  his home 2L [place], but we had to continue nighttime BiPAP at 15/5 35% in order to maintain CO2 at his baseline of 70s-80s and a normal pH. Recommendations were to continue on home BiPAP nightly to allow for adequate oxygenation and management of hypercarbia. Unfortunately, the patient and his wife carry debt with the [employer] and will not be able to get the BiPAP machine unless they develop a plan with the company to pay back this debt. Patient preferred to discharge and discuss with his wife. The BiPAP order will stay active for 1 month if patient and wife are able to work with the company. At discharge, we continued patient's home inhalers and referred him to Pulmonologist Dr. Michelene Gardener for further management.  During the interim,   Past Medical History:  Diagnosis Date  . Carboxyhemoglobinemia   . COPD (chronic obstructive pulmonary disease) (HCC)    on 2.5L chronic home o2  . Hypertension   . Polycythemia, secondary 10/20/2014  . Thrombocytopenia ([place])     Past Surgical History:  Procedure Laterality Date  . APPENDECTOMY    . COLONOSCOPY    . COLONOSCOPY WITH PROPOFOL N/A 11/05/2017   Procedure: COLONOSCOPY WITH PROPOFOL;  Surgeon: Lin Landsman, MD;  Location: [facility] ENDOSCOPY;  Service: Gastroenterology;  Laterality: N/A;  . ESOPHAGOGASTRODUODENOSCOPY    . ESOPHAGOGASTRODUODENOSCOPY N/A 11/04/2017   Procedure: ESOPHAGOGASTRODUODENOSCOPY (EGD);  Surgeon: Lin Landsman, MD;  Location: [facility] ENDOSCOPY;  Service: Gastroenterology;  Laterality: N/A;  . EYE SURGERY Right    cataract  . HERNIA REPAIR    .  polycythemia      Family History  Problem Relation Age of Onset  . Cancer Mother   . Cancer Sister   . Cancer Brother     Social History:  reports that he has been smoking cigarettes. He has been smoking about 0.50 packs per day. He has never used smokeless tobacco. He reports that he drinks about 14.0 standard drinks of alcohol per week. He reports that he does  not use drugs.  He works clearing off land with a bobcat.  He smokes 1/2 pack per day.  He drinks 2 beers per night.  He lives in [place].  He is alone today.  Allergies:  No Known Allergies  Current Medications: Current Outpatient Medications  Medication Sig Dispense Refill  . acetaminophen (TYLENOL) 325 MG tablet Take 2 tablets (650 mg total) by mouth every 6 (six) hours as needed for mild pain (or Fever >/= 101).    Marland Kitchen amLODipine (NORVASC) 5 MG tablet Take 1 tablet (5 mg total) by mouth daily. 90 tablet 2  . aspirin EC 81 MG tablet Take 81 mg by mouth daily.    Marland Kitchen atorvastatin (LIPITOR) 20 MG tablet TAKE 1 TABLET EVERY DAY FOR HYPERCHOLESTEROLEMIA (Patient taking differently: Take 20 mg by mouth daily. ) 90 tablet 4  . benazepril (LOTENSIN) 40 MG tablet Take 1 tablet (40 mg total) by mouth daily. 90 tablet 2  . budesonide-formoterol (SYMBICORT) 160-4.5 MCG/ACT inhaler Inhale 2 puffs into the lungs every morning. INHALE 2 TIMES DAILY (Patient taking differently: Inhale 2 puffs into the lungs 2 (two) times daily. ) 3 Inhaler 4  . docusate sodium (COLACE) 100 MG capsule Take 1 capsule (100 mg total) by mouth 2 (two) times daily. (Patient not taking: Reported on 02/12/2018) 10 capsule 0  . feeding supplement, ENSURE ENLIVE, (ENSURE ENLIVE) LIQD Take 237 mLs by mouth 3 (three) times daily between meals. (Patient not taking: Reported on 02/12/2018) 90 Bottle 0  . ferrous sulfate 325 (65 FE) MG tablet Take 1 tablet (325 mg total) by mouth 2 (two) times daily with a meal. (Patient not taking: Reported on 02/12/2018)  3  . Multiple Vitamin (MULTIVITAMIN WITH MINERALS) TABS tablet Take 1 tablet by mouth daily.    . pantoprazole (PROTONIX) 40 MG tablet Take 1 tablet (40 mg total) by mouth daily before breakfast. (Patient not taking: Reported on 02/12/2018) 30 tablet 0  . tamsulosin (FLOMAX) 0.4 MG CAPS capsule Take 1 capsule (0.4 mg total) by mouth daily. 30 capsule 3  . vitamin C (VITAMIN C) 250 MG  tablet Take 1 tablet (250 mg total) by mouth 2 (two) times daily. (Patient not taking: Reported on 02/12/2018)     No current facility-administered medications for this visit.     Review of Systems  Constitutional: Positive for malaise/fatigue and weight loss (down 1 pound). Negative for diaphoresis and fever.  HENT: Negative for nosebleeds and sore throat.        Dental issues; needs multiple extractions.   Eyes: Negative.   Respiratory: Positive for cough (chronic) and shortness of breath (chronic; on supplemental oxygen). Negative for hemoptysis and sputum production.   Cardiovascular: Negative for chest pain, palpitations, orthopnea, leg swelling and PND.  Gastrointestinal: Negative for abdominal pain, blood in stool, constipation, diarrhea, melena, nausea and vomiting.  Genitourinary: Negative for dysuria, frequency, hematuria and urgency.       Difficulty with initiating urinary stream  Musculoskeletal: Negative for back pain, falls, joint pain and myalgias.  Skin: Negative for  itching and rash.  Neurological: Negative for dizziness, tremors, weakness and headaches.  Endo/Heme/Allergies: Bruises/bleeds easily.  Psychiatric/Behavioral: Negative for depression, memory loss and suicidal ideas. The patient is not nervous/anxious and does not have insomnia.   All other systems reviewed and are negative.   Performance status (ECOG): 1 - Symptomatic but completely ambulatory  Physical Exam: There were no vitals taken for this visit. GENERAL: Thin gentleman sitting comfortably in the exam room in no acute distress. MENTAL STATUS:  Alert and oriented to person, place and time. HEAD:  Wearing a gray cap.  Gray hair.  Normocephalic, atraumatic, face symmetric, no Cushingoid features. EYES:  Brown eyes.  Ruddy sclera.  Pupils equal round and reactive to light and accomodation.  No conjunctivitis or scleral icterus. ENT:  Oropharynx clear without lesion.  Tongue normal. Mucous membranes moist.   RESPIRATORY:  Oxygen 2.5 liters/min via [place].  Clear to auscultation without rales, wheezes or rhonchi. CARDIOVASCULAR:  Regular rate and rhythm without murmur, rub or gallop. ABDOMEN: Ventral hernia.   Soft, non-tender, with active bowel sounds, and no hepatosplenomegaly.  No masses. SKIN:  Nicotine stained nails.  No rashes, ulcers or lesions. EXTREMITIES: No edema, no skin discoloration or tenderness.  No palpable cords. LYMPH NODES: No palpable cervical, supraclavicular, axillary or inguinal adenopathy  NEUROLOGICAL: Unremarkable. PSYCH:  Appropriate.    No visits with results within 3 Day(s) from this visit.  Latest known visit with results is:  Admission on 02/12/2018, Discharged on 02/13/2018  Component Date Value Ref Range Status  . WBC 02/12/2018 7.6  4.0 - 10.5 K/uL Final   [place] COUNT CONFIRMED ON SMEAR  . RBC 02/12/2018 4.36  4.22 - 5.81 MIL/uL Final  . Hemoglobin 02/12/2018 14.0  13.0 - 17.0 g/dL Final  . HCT 02/12/2018 44.8  39.0 - 52.0 % Final  . MCV 02/12/2018 102.8* 80.0 - 100.0 fL Final  . MCH 02/12/2018 32.1  26.0 - 34.0 pg Final  . MCHC 02/12/2018 31.3  30.0 - 36.0 g/dL Final  . RDW 02/12/2018 15.0  11.5 - 15.5 % Final  . Platelets 02/12/2018 126* 150 - 400 K/uL Final  . nRBC 02/12/2018 0.5* 0.0 - 0.2 % Final   Performed at [facility], [address], [place], [place] [address]  . Sodium 02/12/2018 134* 135 - 145 mmol/L Final  . Potassium 02/12/2018 5.4* 3.5 - 5.1 mmol/L Final  . Chloride 02/12/2018 85* 98 - 111 mmol/L Final  . CO2 02/12/2018 37* 22 - 32 mmol/L Final  . Glucose, Bld 02/12/2018 113* 70 - 99 mg/dL Final  . BUN 02/12/2018 34* 8 - 23 mg/dL Final  . Creatinine, Ser 02/12/2018 0.56* 0.61 - 1.24 mg/dL Final  . Calcium 02/12/2018 9.4  8.9 - 10.3 mg/dL Final  . Total Protein 02/12/2018 7.9  6.5 - 8.1 g/dL Final  . Albumin 02/12/2018 4.5  3.5 - 5.0 g/dL Final  . AST 02/12/2018 35  15 - 41 U/L Final  . ALT 02/12/2018 28  0 - 44 U/L Final  .  Alkaline Phosphatase 02/12/2018 60  38 - 126 U/L Final  . Total Bilirubin 02/12/2018 0.9  0.3 - 1.2 mg/dL Final  . GFR calc non Af Amer 02/12/2018 >60  >60 mL/min Final  . GFR calc Af Amer 02/12/2018 >60  >60 mL/min Final   Comment: (NOTE) The eGFR has been calculated using the CKD EPI equation. This calculation has not been validated in all clinical situations. eGFR's persistently <60 mL/min signify possible Chronic Kidney  Disease.   Georgiann Hahn gap 02/12/2018 12  5 - 15 Final   Performed at [facility], [address] [place], [place] [address]  . Troponin I 02/12/2018 0.05* <0.03 ng/mL Final   Comment: CRITICAL RESULT CALLED TO, READ BACK BY AND VERIFIED WITH ANNIE [facility] 02/12/18 1828 JML Performed at [facility], [address], [place], [place] [address]   . B Natriuretic Peptide 02/12/2018 839.0* 0.0 - 100.0 pg/mL Final   Performed at [facility], [address] [place], [place] [address]  . FIO2 02/12/2018 0.44   Final  . Delivery systems 02/12/2018 NASAL CANNULA   Final  . pH, Arterial 02/12/2018 7.19* 7.350 - 7.450 Final   Comment: CRITICAL RESULT CALLED TO, READ BACK BY AND VERIFIED WITH: NOTIFIED DR. Laporte OF THE CRITICAL RESULTS ON 02/12/2018 AT 1830. DW, RRT.   Marland Kitchen pCO2 arterial 02/12/2018 120* 32.0 - 48.0 mmHg Final   Comment: CRITICAL RESULT CALLED TO, READ BACK BY AND VERIFIED WITH: NOTIFIED DR. Melville OF THE CRITICAL RESULTS ON 02/12/2018 AT 1830.   Marland Kitchen pO2, Arterial 02/12/2018 89  83.0 - 108.0 mmHg Final  . Patient temperature 02/12/2018 37.0   Final  . Collection site 02/12/2018 RIGHT RADIAL   Final  . Sample type 02/12/2018 ARTERIAL DRAW   Final  . [place]ens test (pass/fail) 02/12/2018 PASS  PASS Final   Performed at [facility], [address], [place], [place] [address]  . FIO2 02/12/2018 0.55   Final  . Delivery systems 02/12/2018 BILEVEL POSITIVE AIRWAY PRESSURE   Final  . LHR 02/12/2018 10  resp/min Final  .  Inspiratory PAP 02/12/2018 12   Final  . Expiratory PAP 02/12/2018 5   Final  . pH, Arterial 02/12/2018 7.14* 7.350 - 7.450 Final   Comment: RBV HENRY, RN AT 7893 ON 02/12/2018 [place], RRT   . pCO2 arterial 02/12/2018 120* 32.0 - 48.0 mmHg Final   Comment: RBV HENRY, RN AT 2359 ON 02/12/2018 [place], RRT   . pO2, Arterial 02/12/2018 72* 83.0 - 108.0 mmHg Final  . Bicarbonate 02/12/2018 UNABLE TO CALCULATE.  20.0 - 28.0 mmol/L Final  . O2 Saturation 02/12/2018 UNABLE TO CALCULATE.  % Final  . Patient temperature 02/12/2018 37.0   Final  . Collection site 02/12/2018 RIGHT RADIAL   Final  . Sample type 02/12/2018 ARTERIAL DRAW   Final  . Allens test (pass/fail) 02/12/2018 PASS  PASS Final   Performed at [facility], [address], [place], [place] [address]  . Specimen Description 02/13/2018 BLOOD LEFT FATTY CASTS   Final  . Special Requests 02/13/2018 BOTTLES DRAWN AEROBIC AND ANAEROBIC Blood Culture adequate volume   Final  . Culture 02/13/2018    Final                   Value:NO GROWTH 5 DAYS Performed at [facility], [address], [place], [place] [address]   . Report Status 02/13/2018 02/18/2018 FINAL   Final  . Specimen Description 02/13/2018 BLOOD RIGHT FATTY CASTS   Final  . Special Requests 02/13/2018 BOTTLES DRAWN AEROBIC AND ANAEROBIC Blood Culture adequate volume   Final  . Culture 02/13/2018    Final                   Value:NO GROWTH 5 DAYS Performed at [facility], [address], [place], [place] [address]   . Report Status 02/13/2018 02/18/2018 FINAL   Final    Assessment:  Patrick Ayala is a 77 y.o. male with secondary polycythemia and thrombocytopenia of unclear etiology.  He smokes and has a history of an elevated carboxyhemoglobin as high as 23% attributed to smoking in a small unventilated room for many hours and a truck exhaust leak (fixed about a year ago).  He has carbon monoxide detectors. He currently smokes  1/2 pack per day, for the past 6 weeks, reduced from 1 pack per day previously.  He is contemplating and preparing to quit, has nicotine patches.  Platelet count has ranged from 61,000 to 139,000 without trend since 09/2012.  Prior work-up noted the following normal labs:  erythropoietin level, iron studies, JAK2, HIV, ANA, and hepatitis C. Ultrasound revealed no splenomegaly. 86,000 today (09/06/2016).  Chest CT on 06/23/2015 revealed resolution of the left upper lobe pulmonary nodule.  Low dose chest CT on 07/03/2016 revealed new bilateral upper lobe pulmonary nodules, including a dominant ill-defined left upper lobe pulmonary nodule.   Chest CT on 01/17/2017 revealed multiple small pulmonary nodules throughout the lungs bilaterally. The largest nodule on the prior study had completely resolved, indicative of a benign etiology. The largest nodule had a volume derived mean diameter 4.1 mm aspect of the right upper lobe and was stable compared to prior examinations.  There were no larger more suspicious appearing pulmonary nodules or masses are otherwise noted.  There was mild diffuse bronchial wall thickening with severe centrilobular and paraseptal emphysema.  Chest CT angiogram on 02/12/2018 revealed no acute pulmonary embolus.  There were partial consolidations in the bilateral lower lobes may reflect atelectasis, pneumonia or aspiration.  There was severe emphysema  He has had multiple phlebotomies beginning in 09/2012 (last 11/19/2015). He undergoes small phlebotomies (250-300 cc) with supplemental IVF to maintain a hematocrit less than 52.  He has B12 deficiency.  B12 was 260 (low normal) on 12/07/2014.  MMA was elevated c/w B12 deficiency.  He was started on B12 weekly x 6 (02/01/2015 - 03/01/2015) prior to a monthly schedule (last 04/17/2017).  Intrinsic factor antibodies and parietal cell antibodies were negative.  Folate was 33 on 02/20/2018.    Colonoscopy on 07/29/2013 revealed 3 polyps.   Two had histology suggestive of tubular adenoma.   He was admitted to [facility] from 11/02/2017 - 11/07/2017 with rectal bleeding.  EGD on 11/04/2017 revealed multiple dispersed, diminutive non-bleeding erosions were found in the gastric antrum. There were no stigmata of recent bleeding. Biopsies revealed reactive gastritis with no dysplasia or malignancy.  Colonoscopy on 11/05/2017 revealed polyps (tubular adenoma in hepatic flexure; hyperplastic polyp in rectum).  No bleeding source was identified.  Hemoglobin was followed: 9.0--8.1--8.8.  GI recommended a bleeding scan if patient profusely bleed.  He was started on iron supplements and stool softeners.  He was admitted to [facility] from 02/13/2018 - 02/19/2018 with acute on chronic respiratory failure with hypoxia and hypercapnia.  He was treated with BiPAP with back up rate, and his acidosis and hypercarbia improved. Patient was treated with 5 days of Cefdinir, Azithromycin, and Prednisone with improvement in his respiratory status. He was able to be weaned off daytime BiPAP to his home 2L [place], but we had to continue nighttime BiPAP at 15/5 35% in order to maintain CO2 at his baseline of 70s-80s and a normal pH.   Symptomatically, he denies any new complaints. He has chronic shortness of breath on supplemental oxygen.  Exam stable.  Hematocrit is 40.4.  Platelet count is 86,000.  Plan: 1.  Labs today:  CBC  with diff, CMP, ferritin, folate. 2.  Secondary polycythemia:  3.  Thrombocytopenia:  4.  B12 deficiency:  5.  GI bleeding:  Labs today:  CBC with diff, CMP, ferritin, folate. 2.  Discuss monthly B12. He has been non-compliant due to not having scheduled appointments. Will restart today.  3.  Chest CT LCS nodule follow-up w/o contrast on 01/17/2018. 4.  Discuss continued smoking cessation efforts 5.  No phlebotomy today. Hematocrit is 40.4 today; goal is < 52. 6.  RTC in 6 months for MD assessment, labs (CBC with diff, CMP, ferritin, folate), B12,  and +/- phlebotomy.   Lequita Asal, MD 03/04/2018, 4:59 AM   I saw and evaluated the patient, participating in the key portions of the service and reviewing pertinent diagnostic studies and records.  I reviewed the nurse practitioner's note and agree with the findings and the plan.  The assessment and plan were discussed with the patient. A few questions were asked by the patient and answered.   Nolon Stalls, MD 03/04/2018,4:59 AM

## 2018-03-18 ENCOUNTER — Other Ambulatory Visit: Payer: Medicare HMO

## 2018-03-18 ENCOUNTER — Ambulatory Visit: Payer: Medicare HMO | Admitting: Hematology and Oncology

## 2018-03-18 ENCOUNTER — Ambulatory Visit (INDEPENDENT_AMBULATORY_CARE_PROVIDER_SITE_OTHER): Payer: Medicare HMO | Admitting: Family Medicine

## 2018-03-18 ENCOUNTER — Encounter: Payer: Self-pay | Admitting: Family Medicine

## 2018-03-18 VITALS — BP 142/77 | HR 115 | Temp 98.4°F | Ht 69.29 in | Wt 118.0 lb

## 2018-03-18 DIAGNOSIS — Z23 Encounter for immunization: Secondary | ICD-10-CM | POA: Diagnosis not present

## 2018-03-18 DIAGNOSIS — N400 Enlarged prostate without lower urinary tract symptoms: Secondary | ICD-10-CM

## 2018-03-18 DIAGNOSIS — Z125 Encounter for screening for malignant neoplasm of prostate: Secondary | ICD-10-CM

## 2018-03-18 DIAGNOSIS — E785 Hyperlipidemia, unspecified: Secondary | ICD-10-CM | POA: Diagnosis not present

## 2018-03-18 DIAGNOSIS — R490 Dysphonia: Secondary | ICD-10-CM

## 2018-03-18 DIAGNOSIS — Z1329 Encounter for screening for other suspected endocrine disorder: Secondary | ICD-10-CM | POA: Diagnosis not present

## 2018-03-18 DIAGNOSIS — E440 Moderate protein-calorie malnutrition: Secondary | ICD-10-CM | POA: Diagnosis not present

## 2018-03-18 DIAGNOSIS — Z131 Encounter for screening for diabetes mellitus: Secondary | ICD-10-CM | POA: Diagnosis not present

## 2018-03-18 DIAGNOSIS — J410 Simple chronic bronchitis: Secondary | ICD-10-CM

## 2018-03-18 DIAGNOSIS — I10 Essential (primary) hypertension: Secondary | ICD-10-CM | POA: Diagnosis not present

## 2018-03-18 DIAGNOSIS — E46 Unspecified protein-calorie malnutrition: Secondary | ICD-10-CM | POA: Diagnosis not present

## 2018-03-18 DIAGNOSIS — Z7189 Other specified counseling: Secondary | ICD-10-CM

## 2018-03-18 DIAGNOSIS — J449 Chronic obstructive pulmonary disease, unspecified: Secondary | ICD-10-CM | POA: Diagnosis not present

## 2018-03-18 LAB — MICROSCOPIC EXAMINATION
RBC MICROSCOPIC, UA: NONE SEEN /HPF (ref 0–2)
WBC, UA: NONE SEEN /hpf (ref 0–5)

## 2018-03-18 LAB — URINALYSIS, ROUTINE W REFLEX MICROSCOPIC
Bilirubin, UA: NEGATIVE
Glucose, UA: NEGATIVE
Ketones, UA: NEGATIVE
Leukocytes, UA: NEGATIVE
Nitrite, UA: NEGATIVE
RBC, UA: NEGATIVE
Specific Gravity, UA: 1.02 (ref 1.005–1.030)
Urobilinogen, Ur: 0.2 mg/dL (ref 0.2–1.0)
pH, UA: 6 (ref 5.0–7.5)

## 2018-03-18 NOTE — Assessment & Plan Note (Signed)
Patient with BMI of 17.28 is eating better especially after hospitalization

## 2018-03-18 NOTE — Assessment & Plan Note (Addendum)
Using O2 and doing satisfactory Discussed CPAP also as patient's been recommended discussed and encouraged

## 2018-03-18 NOTE — Progress Notes (Signed)
Pt declined PHQ-9, GAD-7, and Functional Status.

## 2018-03-18 NOTE — Assessment & Plan Note (Signed)
The current medical regimen is effective;  continue present plan and medications.  

## 2018-03-18 NOTE — Progress Notes (Signed)
BP (!) 142/77   Pulse (!) 115   Temp 98.4 F (36.9 C) (Oral)   Ht 5' 9.29" (1.76 m)   Wt 118 lb (53.5 kg)   SpO2 (!) 85%   BMI 17.28 kg/m    Subjective:    Patient ID: Patrick Ayala, male    DOB: 1941-02-20, 77 y.o.   MRN: [id number]  HPI: Patrick Ayala is a 77 y.o. male  Chief Complaint  Patient presents with  . Annual Exam   Patient's primary concern is hoarseness that is been ongoing since this summer.  Seems to be getting somewhat worse has been to multiple doctors but not ear nose and throat and nobody has looked at his throat in detail. Patient wearing his oxygen faithfully and doing well with that.  Unfortunately patient still smoking 2 or 3 cigarettes a day does not uses oxygen during this time.  Patient is taking tamsulosin and helps with his urinary flow has seemed to resolve his BPH symptoms. Weight eating a little bit better.  Relevant past medical, surgical, family and social history reviewed and updated as indicated. Interim medical history since our last visit reviewed. Allergies and medications reviewed and updated.  Review of Systems  Constitutional: Negative.   HENT: Negative.   Eyes: Negative.   Respiratory: Negative.   Cardiovascular: Negative.   Gastrointestinal: Negative.   Endocrine: Negative.   Genitourinary: Negative.   Musculoskeletal: Negative.   Skin: Negative.   Allergic/Immunologic: Negative.   Neurological: Negative.   Hematological: Negative.   Psychiatric/Behavioral: Negative.     Per HPI unless specifically indicated above     Objective:    BP (!) 142/77   Pulse (!) 115   Temp 98.4 F (36.9 C) (Oral)   Ht 5' 9.29" (1.76 m)   Wt 118 lb (53.5 kg)   SpO2 (!) 85%   BMI 17.28 kg/m   Wt Readings from Last 3 Encounters:  03/18/18 118 lb (53.5 kg)  02/12/18 111 lb 6.4 oz (50.5 kg)  01/30/18 111 lb 6.4 oz (50.5 kg)    Physical Exam  Constitutional: He is oriented to person, place, and time. He appears well-developed and  well-nourished.  HENT:  Head: Normocephalic and atraumatic.  Right Ear: External ear normal.  Left Ear: External ear normal.  Eyes: Pupils are equal, round, and reactive to light. Conjunctivae and EOM are normal.  Neck: Normal range of motion. Neck supple.  Cardiovascular: Normal rate, regular rhythm, normal heart sounds and intact distal pulses.  Pulmonary/Chest: Effort normal and breath sounds normal.  Abdominal: Soft. Bowel sounds are normal. There is no splenomegaly or hepatomegaly.  Genitourinary:  Genitourinary Comments: BPH changes by hx pt refuses exam  Musculoskeletal: Normal range of motion.  Neurological: He is alert and oriented to person, place, and time. He has normal reflexes.  Skin: No rash noted. No erythema.  Psychiatric: He has a normal mood and affect. His behavior is normal. Judgment and thought content normal.        Assessment & Plan:   Problem List Items Addressed This Visit      Cardiovascular and Mediastinum   Essential hypertension - Primary    The current medical regimen is effective;  continue present plan and medications.       Relevant Orders   CBC with Differential/Platelet   Comprehensive metabolic panel   Lipid panel   TSH   Urinalysis, Routine w reflex microscopic     Respiratory   COPD (chronic obstructive pulmonary  disease) ([place])    Using O2 and doing satisfactory Discussed CPAP also as patient's been recommended discussed and encouraged      Relevant Medications   albuterol (PROVENTIL HFA;VENTOLIN HFA) 108 (90 Base) MCG/ACT inhaler     Genitourinary   BPH (benign prostatic hyperplasia)   Relevant Orders   TSH   Urinalysis, Routine w reflex microscopic     Other   Hyperlipidemia    The current medical regimen is effective;  continue present plan and medications.       Relevant Orders   CBC with Differential/Platelet   Comprehensive metabolic panel   Lipid panel   TSH   Urinalysis, Routine w reflex microscopic    Advanced care planning/counseling discussion    A voluntary discussion about advanced care planning including explanation and discussion of advanced directives was extentively discussed with the patient.  Explained about the healthcare proxy and living will was reviewed and packet with forms with expiration of how to fill them out was given.  Time spent: Encounter 16+ min individuals present: Patient      Malnutrition [facility])    Patient with BMI of 17.28 is eating better especially after hospitalization      Hoarseness    Ongoing chronic hoarseness will refer to ear nose and throat to further evaluate      Relevant Orders   Ambulatory referral to ENT    Other Visit Diagnoses    Thyroid disorder screen       Prostate cancer screening       Screening for diabetes mellitus (DM)       Relevant Orders   CBC with Differential/Platelet   Needs flu shot       Need for vaccination with 13-polyvalent pneumococcal conjugate vaccine           Follow up plan: Return in about 6 months (around 09/17/2018) for BMP.

## 2018-03-18 NOTE — Assessment & Plan Note (Signed)
Ongoing chronic hoarseness will refer to ear nose and throat to further evaluate

## 2018-03-18 NOTE — Assessment & Plan Note (Signed)
A voluntary discussion about advanced care planning including explanation and discussion of advanced directives was extentively discussed with the patient.  Explained about the healthcare proxy and living will was reviewed and packet with forms with expiration of how to fill them out was given.  Time spent: Encounter 16+ min individuals present: Patient 

## 2018-03-19 ENCOUNTER — Encounter: Payer: Self-pay | Admitting: Family Medicine

## 2018-03-19 ENCOUNTER — Other Ambulatory Visit: Payer: Self-pay

## 2018-03-19 DIAGNOSIS — E538 Deficiency of other specified B group vitamins: Secondary | ICD-10-CM

## 2018-03-19 DIAGNOSIS — R918 Other nonspecific abnormal finding of lung field: Secondary | ICD-10-CM

## 2018-03-19 LAB — CBC WITH DIFFERENTIAL/PLATELET
BASOS ABS: 0.1 10*3/uL (ref 0.0–0.2)
Basos: 1 %
EOS (ABSOLUTE): 0.1 10*3/uL (ref 0.0–0.4)
Eos: 1 %
Hematocrit: 40.9 % (ref 37.5–51.0)
Hemoglobin: 13.3 g/dL (ref 13.0–17.7)
Immature Grans (Abs): 0 10*3/uL (ref 0.0–0.1)
Immature Granulocytes: 0 %
Lymphocytes Absolute: 1.9 10*3/uL (ref 0.7–3.1)
Lymphs: 23 %
MCH: 31.3 pg (ref 26.6–33.0)
MCHC: 32.5 g/dL (ref 31.5–35.7)
MCV: 96 fL (ref 79–97)
MONOS ABS: 0.6 10*3/uL (ref 0.1–0.9)
Monocytes: 8 %
NEUTROS ABS: 5.6 10*3/uL (ref 1.4–7.0)
Neutrophils: 67 %
PLATELETS: 95 10*3/uL — AB (ref 150–450)
RBC: 4.25 x10E6/uL (ref 4.14–5.80)
RDW: 13 % (ref 12.3–15.4)
WBC: 8.2 10*3/uL (ref 3.4–10.8)

## 2018-03-19 LAB — LIPID PANEL
CHOLESTEROL TOTAL: 146 mg/dL (ref 100–199)
Chol/HDL Ratio: 1.5 ratio (ref 0.0–5.0)
HDL: 96 mg/dL (ref >39)
LDL Calculated: 44 mg/dL (ref 0–99)
Triglycerides: 32 mg/dL (ref 0–149)
VLDL Cholesterol Cal: 6 mg/dL (ref 5–40)

## 2018-03-19 LAB — COMPREHENSIVE METABOLIC PANEL
A/G RATIO: 2 (ref 1.2–2.2)
ALK PHOS: 66 IU/L (ref 39–117)
ALT: 26 IU/L (ref 0–44)
AST: 30 IU/L (ref 0–40)
Albumin: 4.6 g/dL (ref 3.5–4.8)
BILIRUBIN TOTAL: 0.7 mg/dL (ref 0.0–1.2)
BUN/Creatinine Ratio: 20 (ref 10–24)
BUN: 13 mg/dL (ref 8–27)
CHLORIDE: 95 mmol/L — AB (ref 96–106)
CO2: 30 mmol/L — ABNORMAL HIGH (ref 20–29)
Calcium: 9.5 mg/dL (ref 8.6–10.2)
Creatinine, Ser: 0.65 mg/dL — ABNORMAL LOW (ref 0.76–1.27)
GFR calc non Af Amer: 95 mL/min/{1.73_m2} (ref >59)
GFR, EST AFRICAN AMERICAN: 110 mL/min/{1.73_m2} (ref >59)
GLUCOSE: 89 mg/dL (ref 65–99)
Globulin, Total: 2.3 g/dL (ref 1.5–4.5)
POTASSIUM: 4.2 mmol/L (ref 3.5–5.2)
Sodium: 143 mmol/L (ref 134–144)
TOTAL PROTEIN: 6.9 g/dL (ref 6.0–8.5)

## 2018-03-19 LAB — TSH: TSH: 1.14 u[IU]/mL (ref 0.450–4.500)

## 2018-03-22 ENCOUNTER — Inpatient Hospital Stay: Payer: Medicare HMO | Admitting: Hematology and Oncology

## 2018-03-22 ENCOUNTER — Inpatient Hospital Stay: Payer: Medicare HMO

## 2018-03-22 ENCOUNTER — Inpatient Hospital Stay: Payer: Medicare HMO | Attending: Hematology and Oncology

## 2018-03-22 NOTE — Progress Notes (Deleted)
[facility] day:  03/22/18  Chief Complaint: Patrick Ayala is a 77 y.o. male with secondary polycythemia, thrombocytopenia and B12 deficiency who is seen for 6 month assessment.  HPI: The patient was last seen in the medical oncology clinic on 09/06/2017.  At that time, he denied any new complaints. He had chronic shortness of breath on supplemental oxygen.  Exam was stable.  Hematocrit was 40.4.  Platelet count was 86,000.  Ferritin was 173.  Folate was 34.  He received B12 on 09/06/2017, 11/01/2017, and 11/29/2017.  He was admitted to [facility] from 11/02/2017 - 11/07/2017 with rectal bleeding.  He presented with black tarry stools mixed with blood. EGD on 11/04/2017 revealed multiple dispersed, diminutive non-bleeding erosions were found in the gastric antrum. There were no stigmata of recent bleeding. Biopsies were taken.  Colonoscopy on 11/05/2017 revealed polyps.  No bleeding source was identified.  Hemoglobin was followed: 9.0--8.1--8.8.  GI recommended a bleeding scan if patient profusely bleed.  He was started on iron supplements and stool softeners.  He was seen in the [facility] ER on 02/12/2018 with shortness of breath and weakness.  CXR showed severe emphysema.  Chest CT angiogram revealed no evidence of pulmonary embolism.  There were partial consolidations in the bilateral lower lobes which may reflect ATX vs pneumonia vs aspiration.O2 sats were 55%.  He was treated with DuoNeb x 3, solumedrol.  He was treated with IV antibiotics and BiPAP.  Initial ABG revealed a pH of 7.19 with a pCO2 > 120.  No ICU beds were available.  He was transported to [facility].  He was admitted to [facility] from 02/13/2018 - 02/19/2018.  He was treated with BiPAP with back up rate, and his acidosis and hypercarbia improved. Patient was treated with 5 days of Cefdinir, Azithromycin, and Prednisone with improvement in his respiratory status. He was able to be weaned off daytime BiPAP to  his home 2L , but we had to continue nighttime BiPAP at 15/5 35% in order to maintain CO2 at his baseline of 70s-80s and a normal pH. Recommendations were to continue on home BiPAP nightly to allow for adequate oxygenation and management of hypercarbia. Unfortunately, the patient and his wife carry debt with the [employer] and will not be able to get the BiPAP machine unless they develop a plan with the company to pay back this debt. Patient preferred to discharge and discuss with his wife. The BiPAP order will stay active for 1 month if patient and wife are able to work with the company. At discharge, we continued patient's home inhalers and referred him to Pulmonologist Dr. Michelene Gardener for further management.  During the interim,   Past Medical History:  Diagnosis Date  . Carboxyhemoglobinemia   . COPD (chronic obstructive pulmonary disease) (HCC)    on 2.5L chronic home o2  . Hypertension   . Polycythemia, secondary 10/20/2014  . Thrombocytopenia ([place])     Past Surgical History:  Procedure Laterality Date  . APPENDECTOMY    . COLONOSCOPY    . COLONOSCOPY WITH PROPOFOL N/A 11/05/2017   Procedure: COLONOSCOPY WITH PROPOFOL;  Surgeon: Lin Landsman, MD;  Location: [facility] ENDOSCOPY;  Service: Gastroenterology;  Laterality: N/A;  . ESOPHAGOGASTRODUODENOSCOPY    . ESOPHAGOGASTRODUODENOSCOPY N/A 11/04/2017   Procedure: ESOPHAGOGASTRODUODENOSCOPY (EGD);  Surgeon: Lin Landsman, MD;  Location: [facility] ENDOSCOPY;  Service: Gastroenterology;  Laterality: N/A;  . EYE SURGERY Right    cataract  . HERNIA REPAIR    .  polycythemia      Family History  Problem Relation Age of Onset  . Cancer Mother   . Cancer Sister   . Cancer Brother     Social History:  reports that he has been smoking cigarettes. He has been smoking about 0.50 packs per day. He has never used smokeless tobacco. He reports current alcohol use of about 14.0 standard drinks of alcohol per week. He reports that  he does not use drugs.  He works clearing off land with a bobcat.  He smokes 1/2 pack per day.  He drinks 2 beers per night.  He lives in [place].  He is alone today.  Allergies:  No Known Allergies  Current Medications: Current Outpatient Medications  Medication Sig Dispense Refill  . acetaminophen (TYLENOL) 325 MG tablet Take 2 tablets (650 mg total) by mouth every 6 (six) hours as needed for mild pain (or Fever >/= 101).    Marland Kitchen albuterol (PROVENTIL HFA;VENTOLIN HFA) 108 (90 Base) MCG/ACT inhaler Inhale into the lungs.    Marland Kitchen amLODipine (NORVASC) 5 MG tablet Take 1 tablet (5 mg total) by mouth daily. 90 tablet 2  . aspirin EC 81 MG tablet Take 81 mg by mouth daily.    Marland Kitchen atorvastatin (LIPITOR) 20 MG tablet TAKE 1 TABLET EVERY DAY FOR HYPERCHOLESTEROLEMIA (Patient taking differently: Take 20 mg by mouth daily. ) 90 tablet 4  . benazepril (LOTENSIN) 40 MG tablet Take 1 tablet (40 mg total) by mouth daily. 90 tablet 2  . budesonide-formoterol (SYMBICORT) 160-4.5 MCG/ACT inhaler Inhale 2 puffs into the lungs every morning. INHALE 2 TIMES DAILY (Patient taking differently: Inhale 2 puffs into the lungs 2 (two) times daily. ) 3 Inhaler 4  . docusate sodium (COLACE) 100 MG capsule Take 1 capsule (100 mg total) by mouth 2 (two) times daily. (Patient not taking: Reported on 02/12/2018) 10 capsule 0  . feeding supplement, ENSURE ENLIVE, (ENSURE ENLIVE) LIQD Take 237 mLs by mouth 3 (three) times daily between meals. (Patient not taking: Reported on 02/12/2018) 90 Bottle 0  . ferrous sulfate 325 (65 FE) MG tablet Take 1 tablet (325 mg total) by mouth 2 (two) times daily with a meal. (Patient not taking: Reported on 02/12/2018)  3  . Multiple Vitamin (MULTIVITAMIN WITH MINERALS) TABS tablet Take 1 tablet by mouth daily.    . Multiple Vitamins-Minerals (MULTIVITAMIN WITH MINERALS) tablet Take by mouth.    . pantoprazole (PROTONIX) 40 MG tablet Take 1 tablet (40 mg total) by mouth daily before breakfast.  (Patient not taking: Reported on 02/12/2018) 30 tablet 0  . tamsulosin (FLOMAX) 0.4 MG CAPS capsule Take 1 capsule (0.4 mg total) by mouth daily. 30 capsule 3  . vitamin C (VITAMIN C) 250 MG tablet Take 1 tablet (250 mg total) by mouth 2 (two) times daily. (Patient not taking: Reported on 02/12/2018)     No current facility-administered medications for this visit.     Review of Systems  Constitutional: Positive for malaise/fatigue and weight loss (down 1 pound). Negative for diaphoresis and fever.  HENT: Negative for nosebleeds and sore throat.        Dental issues; needs multiple extractions.   Eyes: Negative.   Respiratory: Positive for cough (chronic) and shortness of breath (chronic; on supplemental oxygen). Negative for hemoptysis and sputum production.   Cardiovascular: Negative for chest pain, palpitations, orthopnea, leg swelling and PND.  Gastrointestinal: Negative for abdominal pain, blood in stool, constipation, diarrhea, melena, nausea and vomiting.  Genitourinary: Negative for  dysuria, frequency, hematuria and urgency.       Difficulty with initiating urinary stream  Musculoskeletal: Negative for back pain, falls, joint pain and myalgias.  Skin: Negative for itching and rash.  Neurological: Negative for dizziness, tremors, weakness and headaches.  Endo/Heme/Allergies: Bruises/bleeds easily.  Psychiatric/Behavioral: Negative for depression, memory loss and suicidal ideas. The patient is not nervous/anxious and does not have insomnia.   All other systems reviewed and are negative.   Performance status (ECOG): 1 - Symptomatic but completely ambulatory  Physical Exam: There were no vitals taken for this visit. GENERAL: Thin gentleman sitting comfortably in the exam room in no acute distress. MENTAL STATUS:  Alert and oriented to person, place and time. HEAD:  Wearing a gray cap.  Gray hair.  Normocephalic, atraumatic, face symmetric, no Cushingoid features. EYES:  Brown eyes.   Ruddy sclera.  Pupils equal round and reactive to light and accomodation.  No conjunctivitis or scleral icterus. ENT:  Oropharynx clear without lesion.  Tongue normal. Mucous membranes moist.  RESPIRATORY:  Oxygen 2.5 liters/min via [place].  Clear to auscultation without rales, wheezes or rhonchi. CARDIOVASCULAR:  Regular rate and rhythm without murmur, rub or gallop. ABDOMEN: Ventral hernia.   Soft, non-tender, with active bowel sounds, and no hepatosplenomegaly.  No masses. SKIN:  Nicotine stained nails.  No rashes, ulcers or lesions. EXTREMITIES: No edema, no skin discoloration or tenderness.  No palpable cords. LYMPH NODES: No palpable cervical, supraclavicular, axillary or inguinal adenopathy  NEUROLOGICAL: Unremarkable. PSYCH:  Appropriate.    No visits with results within 3 Day(s) from this visit.  Latest known visit with results is:  Office Visit on 03/18/2018  Component Date Value Ref Range Status  . WBC 03/18/2018 8.2  3.4 - 10.8 x10E3/uL Final  . RBC 03/18/2018 4.25  4.14 - 5.80 x10E6/uL Final  . Hemoglobin 03/18/2018 13.3  13.0 - 17.7 g/dL Final  . Hematocrit 03/18/2018 40.9  37.5 - 51.0 % Final  . MCV 03/18/2018 96  79 - 97 fL Final  . MCH 03/18/2018 31.3  26.6 - 33.0 pg Final  . MCHC 03/18/2018 32.5  31.5 - 35.7 g/dL Final  . RDW 03/18/2018 13.0  12.3 - 15.4 % Final   Comment: **Effective April 15, 2018, the RDW pediatric reference**   interval will be removed and the adult reference interval   will be changing to:                             Male 11.7 - 15.4                                                      Male 11.6 - 15.4   . Platelets 03/18/2018 95* 150 - 450 x10E3/uL Final  . Neutrophils 03/18/2018 67  Not Estab. % Final  . Lymphs 03/18/2018 23  Not Estab. % Final  . Monocytes 03/18/2018 8  Not Estab. % Final  . Eos 03/18/2018 1  Not Estab. % Final  . Basos 03/18/2018 1  Not Estab. % Final  . Neutrophils Absolute 03/18/2018 5.6  1.4 - 7.0 x10E3/uL Final  .  Lymphocytes Absolute 03/18/2018 1.9  0.7 - 3.1 x10E3/uL Final  . Monocytes Absolute 03/18/2018 0.6  0.1 - 0.9 x10E3/uL Final  . EOS (ABSOLUTE) 03/18/2018 0.1  0.0 - 0.4 x10E3/uL Final  . Basophils Absolute 03/18/2018 0.1  0.0 - 0.2 x10E3/uL Final  . Immature Granulocytes 03/18/2018 0  Not Estab. % Final  . Immature Grans (Abs) 03/18/2018 0.0  0.0 - 0.1 x10E3/uL Final  . Hematology Comments: 03/18/2018 Note:   Final   Verified by microscopic examination.  . Glucose 03/18/2018 89  65 - 99 mg/dL Final  . BUN 03/18/2018 13  8 - 27 mg/dL Final  . Creatinine, Ser 03/18/2018 0.65* 0.76 - 1.27 mg/dL Final  . GFR calc non Af Amer 03/18/2018 95  >59 mL/min/1.73 Final  . GFR calc Af Amer 03/18/2018 110  >59 mL/min/1.73 Final  . BUN/Creatinine Ratio 03/18/2018 20  10 - 24 Final  . Sodium 03/18/2018 143  134 - 144 mmol/L Final  . Potassium 03/18/2018 4.2  3.5 - 5.2 mmol/L Final  . Chloride 03/18/2018 95* 96 - 106 mmol/L Final  . CO2 03/18/2018 30* 20 - 29 mmol/L Final  . Calcium 03/18/2018 9.5  8.6 - 10.2 mg/dL Final  . Total Protein 03/18/2018 6.9  6.0 - 8.5 g/dL Final  . Albumin 03/18/2018 4.6  3.5 - 4.8 g/dL Final  . Globulin, Total 03/18/2018 2.3  1.5 - 4.5 g/dL Final  . Albumin/Globulin Ratio 03/18/2018 2.0  1.2 - 2.2 Final  . Bilirubin Total 03/18/2018 0.7  0.0 - 1.2 mg/dL Final  . Alkaline Phosphatase 03/18/2018 66  39 - 117 IU/L Final  . AST 03/18/2018 30  0 - 40 IU/L Final  . ALT 03/18/2018 26  0 - 44 IU/L Final  . Cholesterol, Total 03/18/2018 146  100 - 199 mg/dL Final  . Triglycerides 03/18/2018 32  0 - 149 mg/dL Final  . HDL 03/18/2018 96  >39 mg/dL Final  . VLDL Cholesterol Cal 03/18/2018 6  5 - 40 mg/dL Final  . LDL Calculated 03/18/2018 44  0 - 99 mg/dL Final  . Chol/HDL Ratio 03/18/2018 1.5  0.0 - 5.0 ratio Final   Comment:                                   T. Chol/HDL Ratio                                             Men  Women                               1/2 Avg.Risk  3.4     3.3                                   Avg.Risk  5.0    4.4                                2X Avg.Risk  9.6    7.1                                3X Avg.Risk 23.4   11.0   . TSH 03/18/2018 1.140  0.450 - 4.500 uIU/mL Final  . Specific Gravity, UA 03/18/2018  1.020  1.005 - 1.030 Final  . pH, UA 03/18/2018 6.0  5.0 - 7.5 Final  . Color, UA 03/18/2018 Yellow  Yellow Final  . Appearance Ur 03/18/2018 Clear  Clear Final  . Leukocytes, UA 03/18/2018 Negative  Negative Final  . Protein, UA 03/18/2018 1+* Negative/Trace Final  . Glucose, UA 03/18/2018 Negative  Negative Final  . Ketones, UA 03/18/2018 Negative  Negative Final  . RBC, UA 03/18/2018 Negative  Negative Final  . Bilirubin, UA 03/18/2018 Negative  Negative Final  . Urobilinogen, Ur 03/18/2018 0.2  0.2 - 1.0 mg/dL Final  . Nitrite, UA 03/18/2018 Negative  Negative Final  . Microscopic Examination 03/18/2018 See below:   Final  . WBC, UA 03/18/2018 None seen  0 - 5 /hpf Final  . RBC, UA 03/18/2018 None seen  0 - 2 /hpf Final  . Epithelial Cells (non renal) 03/18/2018 0-10  0 - 10 /hpf Final  . Renal Epithel, UA 03/18/2018 0-10* None seen /hpf Final  . Mucus, UA 03/18/2018 Present  Not Estab. Final  . Bacteria, UA 03/18/2018 Few  None seen/Few Final    Assessment:  Patrick Ayala is a 76 y.o. male with secondary polycythemia and thrombocytopenia of unclear etiology.  He smokes and has a history of an elevated carboxyhemoglobin as high as 23% attributed to smoking in a small unventilated room for many hours and a truck exhaust leak (fixed about a year ago).  He has carbon monoxide detectors. He currently smokes 1/2 pack per day, for the past 6 weeks, reduced from 1 pack per day previously.  He is contemplating and preparing to quit, has nicotine patches.  Platelet count has ranged from 61,000 to 139,000 without trend since 09/2012.  Prior work-up noted the following normal labs:  erythropoietin level, iron studies, JAK2, HIV, ANA, and  hepatitis C. Ultrasound revealed no splenomegaly. 86,000 today (09/06/2016).  Chest CT on 06/23/2015 revealed resolution of the left upper lobe pulmonary nodule.  Low dose chest CT on 07/03/2016 revealed new bilateral upper lobe pulmonary nodules, including a dominant ill-defined left upper lobe pulmonary nodule.   Chest CT on 01/17/2017 revealed multiple small pulmonary nodules throughout the lungs bilaterally. The largest nodule on the prior study had completely resolved, indicative of a benign etiology. The largest nodule had a volume derived mean diameter 4.1 mm aspect of the right upper lobe and was stable compared to prior examinations.  There were no larger more suspicious appearing pulmonary nodules or masses are otherwise noted.  There was mild diffuse bronchial wall thickening with severe centrilobular and paraseptal emphysema.  Chest CT angiogram on 02/12/2018 revealed no acute pulmonary embolus.  There were partial consolidations in the bilateral lower lobes may reflect atelectasis, pneumonia or aspiration.  There was severe emphysema  He has had multiple phlebotomies beginning in 09/2012 (last 11/19/2015). He undergoes small phlebotomies (250-300 cc) with supplemental IVF to maintain a hematocrit less than 52.  He has B12 deficiency.  B12 was 260 (low normal) on 12/07/2014.  MMA was elevated c/w B12 deficiency.  He was started on B12 weekly x 6 (02/01/2015 - 03/01/2015) prior to a monthly schedule (last 04/17/2017).  Intrinsic factor antibodies and parietal cell antibodies were negative.  Folate was 33 on 02/20/2018.    Colonoscopy on 07/29/2013 revealed 3 polyps.  Two had histology suggestive of tubular adenoma.   He was admitted to [facility] from 11/02/2017 - 11/07/2017 with rectal bleeding.  EGD on 11/04/2017 revealed multiple dispersed, diminutive non-bleeding erosions were found  in the gastric antrum. There were no stigmata of recent bleeding. Biopsies revealed reactive gastritis with no  dysplasia or malignancy.  Colonoscopy on 11/05/2017 revealed polyps (tubular adenoma in hepatic flexure; hyperplastic polyp in rectum).  No bleeding source was identified.  Hemoglobin was followed: 9.0--8.1--8.8.  GI recommended a bleeding scan if patient profusely bleed.  He was started on iron supplements and stool softeners.  He was admitted to [facility] from 02/13/2018 - 02/19/2018 with acute on chronic respiratory failure with hypoxia and hypercapnia.  He was treated with BiPAP with back up rate, and his acidosis and hypercarbia improved. Patient was treated with 5 days of Cefdinir, Azithromycin, and Prednisone with improvement in his respiratory status. He was able to be weaned off daytime BiPAP to his home 2L [place], but we had to continue nighttime BiPAP at 15/5 35% in order to maintain CO2 at his baseline of 70s-80s and a normal pH.   Symptomatically, he denies any new complaints. He has chronic shortness of breath on supplemental oxygen.  Exam stable.  Hematocrit is 40.4.  Platelet count is 86,000.  Plan: 1.  Labs today:  CBC with diff, CMP, ferritin, folate. 2.  Secondary polycythemia:  3.  Thrombocytopenia:  4.  B12 deficiency:  5.  GI bleeding:  Labs today:  CBC with diff, CMP, ferritin, folate. 2.  Discuss monthly B12. He has been non-compliant due to not having scheduled appointments. Will restart today.  3.  Chest CT LCS nodule follow-up w/o contrast on 01/17/2018. 4.  Discuss continued smoking cessation efforts 5.  No phlebotomy today. Hematocrit is 40.4 today; goal is < 52. 6.  RTC in 6 months for MD assessment, labs (CBC with diff, CMP, ferritin, folate), B12, and +/- phlebotomy.   Lequita Asal, MD 03/22/2018, 4:57 AM   I saw and evaluated the patient, participating in the key portions of the service and reviewing pertinent diagnostic studies and records.  I reviewed the nurse practitioner's note and agree with the findings and the plan.  The assessment and plan were  discussed with the patient. A few questions were asked by the patient and answered.   Nolon Stalls, MD 03/22/2018,4:57 AM

## 2018-04-24 DIAGNOSIS — K219 Gastro-esophageal reflux disease without esophagitis: Secondary | ICD-10-CM | POA: Diagnosis not present

## 2018-04-24 DIAGNOSIS — J301 Allergic rhinitis due to pollen: Secondary | ICD-10-CM | POA: Diagnosis not present

## 2018-04-24 DIAGNOSIS — R490 Dysphonia: Secondary | ICD-10-CM | POA: Diagnosis not present

## 2018-04-26 ENCOUNTER — Encounter: Payer: Self-pay | Admitting: Hematology and Oncology

## 2018-04-26 ENCOUNTER — Other Ambulatory Visit: Payer: Self-pay

## 2018-04-26 ENCOUNTER — Other Ambulatory Visit: Payer: Self-pay | Admitting: Hematology and Oncology

## 2018-04-26 ENCOUNTER — Inpatient Hospital Stay (HOSPITAL_BASED_OUTPATIENT_CLINIC_OR_DEPARTMENT_OTHER): Payer: Medicare HMO | Admitting: Hematology and Oncology

## 2018-04-26 ENCOUNTER — Inpatient Hospital Stay: Payer: Medicare HMO

## 2018-04-26 ENCOUNTER — Inpatient Hospital Stay: Payer: Medicare HMO | Attending: Hematology and Oncology

## 2018-04-26 VITALS — BP 114/55 | Temp 97.9°F | Resp 18 | Ht 69.29 in | Wt 118.0 lb

## 2018-04-26 DIAGNOSIS — J449 Chronic obstructive pulmonary disease, unspecified: Secondary | ICD-10-CM | POA: Insufficient documentation

## 2018-04-26 DIAGNOSIS — I10 Essential (primary) hypertension: Secondary | ICD-10-CM | POA: Insufficient documentation

## 2018-04-26 DIAGNOSIS — D696 Thrombocytopenia, unspecified: Secondary | ICD-10-CM

## 2018-04-26 DIAGNOSIS — Z9981 Dependence on supplemental oxygen: Secondary | ICD-10-CM | POA: Insufficient documentation

## 2018-04-26 DIAGNOSIS — F17210 Nicotine dependence, cigarettes, uncomplicated: Secondary | ICD-10-CM | POA: Diagnosis not present

## 2018-04-26 DIAGNOSIS — R918 Other nonspecific abnormal finding of lung field: Secondary | ICD-10-CM

## 2018-04-26 DIAGNOSIS — D751 Secondary polycythemia: Secondary | ICD-10-CM

## 2018-04-26 DIAGNOSIS — E538 Deficiency of other specified B group vitamins: Secondary | ICD-10-CM

## 2018-04-26 LAB — COMPREHENSIVE METABOLIC PANEL
ALT: 22 U/L (ref 0–44)
AST: 24 U/L (ref 15–41)
Albumin: 4.5 g/dL (ref 3.5–5.0)
Alkaline Phosphatase: 55 U/L (ref 38–126)
Anion gap: 9 (ref 5–15)
BUN: 11 mg/dL (ref 8–23)
CO2: 40 mmol/L — ABNORMAL HIGH (ref 22–32)
Calcium: 9.3 mg/dL (ref 8.9–10.3)
Chloride: 90 mmol/L — ABNORMAL LOW (ref 98–111)
Creatinine, Ser: 0.57 mg/dL — ABNORMAL LOW (ref 0.61–1.24)
GFR calc Af Amer: >60 mL/min (ref >60)
GFR calc non Af Amer: >60 mL/min (ref >60)
Glucose, Bld: 103 mg/dL — ABNORMAL HIGH (ref 70–99)
Potassium: 4.4 mmol/L (ref 3.5–5.1)
Sodium: 139 mmol/L (ref 135–145)
Total Bilirubin: 1 mg/dL (ref 0.3–1.2)
Total Protein: 7.6 g/dL (ref 6.5–8.1)

## 2018-04-26 LAB — FOLATE: Folate: 36 ng/mL (ref >5.9)

## 2018-04-26 LAB — CBC WITH DIFFERENTIAL/PLATELET
Abs Immature Granulocytes: 0.05 10*3/uL (ref 0.00–0.07)
Basophils Absolute: 0.1 10*3/uL (ref 0.0–0.1)
Basophils Relative: 1 %
Eosinophils Absolute: 0.1 10*3/uL (ref 0.0–0.5)
Eosinophils Relative: 1 %
HCT: 47.8 % (ref 39.0–52.0)
Hemoglobin: 14.9 g/dL (ref 13.0–17.0)
Immature Granulocytes: 1 %
Lymphocytes Relative: 21 %
Lymphs Abs: 2.1 10*3/uL (ref 0.7–4.0)
MCH: 32 pg (ref 26.0–34.0)
MCHC: 31.2 g/dL (ref 30.0–36.0)
MCV: 102.8 fL — ABNORMAL HIGH (ref 80.0–100.0)
Monocytes Absolute: 0.9 10*3/uL (ref 0.1–1.0)
Monocytes Relative: 10 %
Neutro Abs: 6.6 10*3/uL (ref 1.7–7.7)
Neutrophils Relative %: 66 %
Platelets: 107 10*3/uL — ABNORMAL LOW (ref 150–400)
RBC: 4.65 MIL/uL (ref 4.22–5.81)
RDW: 13.9 % (ref 11.5–15.5)
WBC: 9.8 10*3/uL (ref 4.0–10.5)
nRBC: 0 % (ref 0.0–0.2)

## 2018-04-26 LAB — FERRITIN: Ferritin: 115 ng/mL (ref 24–336)

## 2018-04-26 MED ORDER — CYANOCOBALAMIN 1000 MCG/ML IJ SOLN
1000.0000 ug | Freq: Once | INTRAMUSCULAR | Status: AC
  Administered 2018-04-26: 1000 ug via INTRAMUSCULAR
  Filled 2018-04-26: qty 1

## 2018-04-26 NOTE — Progress Notes (Signed)
[facility] day:  04/26/18  Chief Complaint: Patrick Ayala is a 78 y.o. male with secondary polycythemia, thrombocytopenia and B12 deficiency who is seen for 6 month assessment.  HPI: The patient was last seen in the medical oncology clinic on 09/06/2017.  At that time, he denied any new complaints. He had chronic shortness of breath on supplemental oxygen.  Exam was stable.  Hematocrit was 40.4.  Platelet count was 86,000.  He received B12 on 02/20/2017 and 04/17/2017.  He was seen in the [facility] ER on 02/12/2018 with shortness of breath and weakness. O2 sat was 44% on 2 liters.  He was initially acidotic (pH 7.19) with a pCO2 of > 120. He required BiPAP.  He was transferred to [facility] secondary to no ICU beds.  Chest CT angiogram on 02/12/2018 revealed no evidence of pulmonary embolism.  There were partial consolidations in the bilateral lower lobes may reflect atelectasis, pneumonia or aspiration.  There was evere emphysema.  He was admitted to [facility] from 02/13/2018 - 02/19/2018.  Notes reviewed.  He had hypoxia and hypercapnia though to be secondary to COPD exacerbation with possible contribution by community acquired pneumonia.  He was treated with BiPAP with back up rate, and his acidosis and hypercarbia improved. Patient was treated with 5 days of Cefdinir, Azithromycin, and Prednisone with improvement in his respiratory status. He was able to be weaned off daytime BiPAP to his home 2L [place], but continued nighttime BiPAP at 15/5 35% in order to maintain CO2 at his baseline of 70s-80s and a normal pH.  Recommendation was to continue on home BiPAP nightly to allow for adequate oxygenation and management of hypercarbia. Unfortunately, the patient and his wife carry debt with the [employer] and will not be able to get the BiPAP machine unless they develop a plan with the company to pay back this debt. Patient preferred to be discharged. The BiPAP order  would stay active for 1 month if patient and wife are able to work with the company. At discharge, patient's home inhalers were continued and he was referred to pulmonologist Dr. Michelene Gardener for further management.  CBC on 03/18/2018 revealed a hematocrit of 40.9, hemoglobin 13.3, MCV 96, platelets 95,000, WBC 8200 with an ANC of 5600.  During the interim, patient has chronic shortness of breath. He presents today with supplemental oxygen in place at 2L/[place]. He is using a pulse delivering D tank. Saturations initially 83%. Patient switched over to 2L/[place] continuous flow and saturations dropped into the low 70s. Patient was coached to breath through his nose. Saturations improved to low 90s.    Past Medical History:  Diagnosis Date  . Carboxyhemoglobinemia   . COPD (chronic obstructive pulmonary disease) (HCC)    on 2.5L chronic home o2  . Hypertension   . Polycythemia, secondary 10/20/2014  . Thrombocytopenia ([place])     Past Surgical History:  Procedure Laterality Date  . APPENDECTOMY    . COLONOSCOPY    . COLONOSCOPY WITH PROPOFOL N/A 11/05/2017   Procedure: COLONOSCOPY WITH PROPOFOL;  Surgeon: Lin Landsman, MD;  Location[facility] ENDOSCOPY;  Service: Gastroenterology;  Laterality: N/A;  . ESOPHAGOGASTRODUODENOSCOPY    . ESOPHAGOGASTRODUODENOSCOPY N/A 11/04/2017   Procedure: ESOPHAGOGASTRODUODENOSCOPY (EGD);  Surgeon: Lin Landsman, MD;  Location: [facility] ENDOSCOPY;  Service: Gastroenterology;  Laterality: N/A;  . EYE SURGERY Right    cataract  . HERNIA REPAIR    . polycythemia      Family  History  Problem Relation Age of Onset  . Cancer Mother   . Cancer Sister   . Cancer Brother     Social History:  reports that he has been smoking cigarettes. He has been smoking about 0.50 packs per day. He has never used smokeless tobacco. He reports current alcohol use of about 14.0 standard drinks of alcohol per week. He reports that he does not use drugs.  He works clearing off land  with a bobcat.  He smokes 1/2 pack per day.  He drinks 2 beers per night.  He lives in [place].  He is alone today.  Allergies:  No Known Allergies  Current Medications: Current Outpatient Medications  Medication Sig Dispense Refill  . albuterol (PROVENTIL HFA;VENTOLIN HFA) 108 (90 Base) MCG/ACT inhaler Inhale into the lungs.    Marland Kitchen amLODipine (NORVASC) 5 MG tablet Take 1 tablet (5 mg total) by mouth daily. 90 tablet 2  . aspirin EC 81 MG tablet Take 81 mg by mouth daily.    Marland Kitchen atorvastatin (LIPITOR) 20 MG tablet TAKE 1 TABLET EVERY DAY FOR HYPERCHOLESTEROLEMIA (Patient taking differently: Take 20 mg by mouth daily. ) 90 tablet 4  . benazepril (LOTENSIN) 40 MG tablet Take 1 tablet (40 mg total) by mouth daily. 90 tablet 2  . budesonide-formoterol (SYMBICORT) 160-4.5 MCG/ACT inhaler Inhale 2 puffs into the lungs every morning. INHALE 2 TIMES DAILY (Patient taking differently: Inhale 2 puffs into the lungs 2 (two) times daily. ) 3 Inhaler 4  . fluticasone (FLONASE) 50 MCG/ACT nasal spray     . Multiple Vitamin (MULTIVITAMIN WITH MINERALS) TABS tablet Take 1 tablet by mouth daily.    . Multiple Vitamins-Minerals (MULTIVITAMIN WITH MINERALS) tablet Take by mouth.    Marland Kitchen omeprazole (PRILOSEC) 20 MG capsule     . pantoprazole (PROTONIX) 40 MG tablet Take 1 tablet (40 mg total) by mouth daily before breakfast. 30 tablet 0  . tamsulosin (FLOMAX) 0.4 MG CAPS capsule Take 1 capsule (0.4 mg total) by mouth daily. 30 capsule 3  . acetaminophen (TYLENOL) 325 MG tablet Take 2 tablets (650 mg total) by mouth every 6 (six) hours as needed for mild pain (or Fever >/= 101). (Patient not taking: Reported on 04/26/2018)    . docusate sodium (COLACE) 100 MG capsule Take 1 capsule (100 mg total) by mouth 2 (two) times daily. (Patient not taking: Reported on 02/12/2018) 10 capsule 0  . feeding supplement, ENSURE ENLIVE, (ENSURE ENLIVE) LIQD Take 237 mLs by mouth 3 (three) times daily between meals. (Patient not  taking: Reported on 02/12/2018) 90 Bottle 0  . ferrous sulfate 325 (65 FE) MG tablet Take 1 tablet (325 mg total) by mouth 2 (two) times daily with a meal. (Patient not taking: Reported on 02/12/2018)  3  . vitamin C (VITAMIN C) 250 MG tablet Take 1 tablet (250 mg total) by mouth 2 (two) times daily. (Patient not taking: Reported on 02/12/2018)     No current facility-administered medications for this visit.     Review of Systems  Constitutional: Positive for malaise/fatigue and weight loss (2 pounds). Negative for diaphoresis and fever.  HENT: Negative for congestion, ear pain, nosebleeds, sinus pain and sore throat.        Poor dentition.  Eyes: Negative.  Negative for blurred vision, double vision and photophobia.  Respiratory: Positive for cough (chronic) and shortness of breath (chronic, on supplemental oxygen). Negative for hemoptysis and sputum production.   Cardiovascular: Negative.  Negative for chest pain, palpitations,  orthopnea, leg swelling and PND.  Gastrointestinal: Negative.  Negative for abdominal pain, blood in stool, constipation, diarrhea, melena, nausea and vomiting.  Genitourinary: Negative.  Negative for dysuria, hematuria and urgency.  Musculoskeletal: Negative.  Negative for back pain, falls, joint pain, myalgias and neck pain.  Skin: Negative.  Negative for itching and rash.  Neurological: Negative.  Negative for dizziness, tremors, sensory change, speech change, focal weakness, weakness and headaches.  Endo/Heme/Allergies: Bruises/bleeds easily.  Psychiatric/Behavioral: Negative.  Negative for depression and memory loss. The patient is not nervous/anxious and does not have insomnia.   All other systems reviewed and are negative.   Performance status (ECOG): 1-2  Vita signs: Blood pressure (!) 114/55, temperature 97.9 F (36.6 C), temperature source Tympanic, resp. rate 18, height 5' 9.29" (1.76 m), weight 118 lb (53.5 kg), SpO2 (!) 83 %.   Physical Exam   Constitutional: He is oriented to person, place, and time. No distress.  Thin gentleman sitting comfortably in exam room.  HENT:  Head: Normocephalic and atraumatic.  Mouth/Throat: No oropharyngeal exudate.  Gray hair.  Supplemental oxygen.  Mouth dry.  Eyes: Pupils are equal, round, and reactive to light. EOM are normal. No scleral icterus.  Brown eyes.  Ruddy sclera.  Neck: Normal range of motion. Neck supple. No JVD present.  Cardiovascular: Normal rate and normal heart sounds. Exam reveals no gallop and no friction rub.  No murmur heard. Pulmonary/Chest: Breath sounds normal. No respiratory distress. He has no wheezes. He has no rales.  Abdominal: Soft. Bowel sounds are normal. He exhibits no distension and no mass. There is no abdominal tenderness. There is no rebound and no guarding.  Musculoskeletal:        General: No edema.  Lymphadenopathy:    He has no cervical adenopathy.  Neurological: He is alert and oriented to person, place, and time. No cranial nerve deficit.  Skin: Skin is warm and dry. No rash noted. He is not diaphoretic. No erythema. No pallor.  Nicotine stained nails.  Psychiatric: He has a normal mood and affect. His behavior is normal. Judgment normal.  Nursing note and vitals reviewed.   Orders Only on 04/26/2018  Component Date Value Ref Range Status  . Sodium 04/26/2018 139  135 - 145 mmol/L Final  . Potassium 04/26/2018 4.4  3.5 - 5.1 mmol/L Final  . Chloride 04/26/2018 90* 98 - 111 mmol/L Final  . CO2 04/26/2018 40* 22 - 32 mmol/L Final  . Glucose, Bld 04/26/2018 103* 70 - 99 mg/dL Final  . BUN 04/26/2018 11  8 - 23 mg/dL Final  . Creatinine, Ser 04/26/2018 0.57* 0.61 - 1.24 mg/dL Final  . Calcium 04/26/2018 9.3  8.9 - 10.3 mg/dL Final  . Total Protein 04/26/2018 7.6  6.5 - 8.1 g/dL Final  . Albumin 04/26/2018 4.5  3.5 - 5.0 g/dL Final  . AST 04/26/2018 24  15 - 41 U/L Final  . ALT 04/26/2018 22  0 - 44 U/L Final  . Alkaline Phosphatase 04/26/2018  55  38 - 126 U/L Final  . Total Bilirubin 04/26/2018 1.0  0.3 - 1.2 mg/dL Final  . GFR calc non Af Amer 04/26/2018 >60  >60 mL/min Final  . GFR calc Af Amer 04/26/2018 >60  >60 mL/min Final  . Anion gap 04/26/2018 9  5 - 15 Final   Performed at [facility], [address] [place], [place] [address]  . WBC 04/26/2018 9.8  4.0 - 10.5 K/uL Final  . RBC 04/26/2018 4.65  4.22 - 5.81 MIL/uL Final  . Hemoglobin 04/26/2018 14.9  13.0 - 17.0 g/dL Final  . HCT 04/26/2018 47.8  39.0 - 52.0 % Final  . MCV 04/26/2018 102.8* 80.0 - 100.0 fL Final  . MCH 04/26/2018 32.0  26.0 - 34.0 pg Final  . MCHC 04/26/2018 31.2  30.0 - 36.0 g/dL Final  . RDW 04/26/2018 13.9  11.5 - 15.5 % Final  . Platelets 04/26/2018 107* 150 - 400 K/uL Final  . nRBC 04/26/2018 0.0  0.0 - 0.2 % Final  . Neutrophils Relative % 04/26/2018 66  % Final  . Neutro Abs 04/26/2018 6.6  1.7 - 7.7 K/uL Final  . Lymphocytes Relative 04/26/2018 21  % Final  . Lymphs Abs 04/26/2018 2.1  0.7 - 4.0 K/uL Final  . Monocytes Relative 04/26/2018 10  % Final  . Monocytes Absolute 04/26/2018 0.9  0.1 - 1.0 K/uL Final  . Eosinophils Relative 04/26/2018 1  % Final  . Eosinophils Absolute 04/26/2018 0.1  0.0 - 0.5 K/uL Final  . Basophils Relative 04/26/2018 1  % Final  . Basophils Absolute 04/26/2018 0.1  0.0 - 0.1 K/uL Final  . Immature Granulocytes 04/26/2018 1  % Final  . Abs Immature Granulocytes 04/26/2018 0.05  0.00 - 0.07 K/uL Final   Performed at [facility], [address] [place], [place] [address]    Assessment:  Patrick Ayala is a 78 y.o. male with secondary polycythemia and thrombocytopenia of unclear etiology.  He smokes and has a history of an elevated carboxyhemoglobin as high as 23% attributed to smoking in a small unventilated room for many hours and a truck exhaust leak (fixed about a year ago).  He has carbon monoxide detectors. He currently smokes 1/2 pack per day, for the past 6 weeks, reduced from 1 pack per  day previously.  He is contemplating and preparing to quit, has nicotine patches.  He has chronic thrombocytopenia.  Platelet count has ranged from 61,000 to 139,000 without trend since 09/2012.  Prior work-up noted the following normal labs:  erythropoietin level, iron studies, JAK2, HIV, ANA, and hepatitis C. Hepatitis B surface antigen and hepatitis B core antibody IgM was negative on 11/03/2017.  TSH was normal on 03/18/2018.  Ultrasound revealed no splenomegaly.   Chest CT on 06/23/2015 revealed resolution of the left upper lobe pulmonary nodule.  Low dose chest CT on 07/03/2016 revealed new bilateral upper lobe pulmonary nodules, including a dominant ill-defined left upper lobe pulmonary nodule.   Chest CT on 01/17/2017 revealed multiple small pulmonary nodules throughout the lungs bilaterally. The largest nodule on the prior study had completely resolved, indicative of a benign etiology. The largest nodule had a volume derived mean diameter 4.1 mm aspect of the right upper lobe and was stable compared to prior examinations.  There were no larger more suspicious appearing pulmonary nodules or masses are otherwise noted.  There was mild diffuse bronchial wall thickening with severe centrilobular and paraseptal emphysema.  Chest CT angiogram on 02/12/2018 revealed no evidence of pulmonary embolism.  There were partial consolidations in the bilateral lower lobes may reflect atelectasis, pneumonia or aspiration.  There was evere emphysema.  He has had multiple phlebotomies beginning in 09/2012 (last 11/19/2015). He undergoes small phlebotomies (250-300 cc) with supplemental IVF to maintain a hematocrit less than 52.  He has B12 deficiency.  B12 was 260 (low normal) on 12/07/2014.  MMA was elevated c/w B12 deficiency.  He was started on B12 weekly x 6 (02/01/2015 -  03/01/2015) prior to a monthly schedule (last 11/29/2017).  Intrinsic factor antibodies and parietal cell antibodies were negative.  Folate  was 33 on 02/20/2017 and 34 on 09/06/2017.    Colonoscopy on 07/29/2013 revealed 3 polyps.  Two had histology suggestive of tubular adenoma.   He was admitted to [facility] from 02/13/2018 - 02/19/2018 with hypoxia and hypercapnia secondary to COPD exacerbation with a community acquired pneumonia.  He was initially acidotic (pH 7.19) with a pCO2 of > 120.  He was treated with BiPAP and 5 days of Cefdinir, Azithromycin, and Prednisone.  Recommendation was to continue on home BiPAP nightly to allow for adequate oxygenation and management of hypercarbia.   Symptomatically, he has chronic shortness of breath.  Oxygen saturations are marginal.  Exam is stable.  Hematocrit is 47.8.  Platelet count is 107,000.  Plan: 1.   Labs today:  CBC with diff, CMP, hepatitis B core antibody, ferritin, folate. 2.   Secondary erythrocytosis  Hematocrit 47.8.  Hemoglobin 14.9.  Hematocrit goal < 52.  Etiology secondary to smoking.  No indication for phlebotomy today. 3.   Thrombocytopenia  Platelet count 107,000.  Platelet count fluctuating (61,000 - 126,000) in past year.  Etiology unclear.  Continue to monitor. 4.   B12 deficiency  Last B12 injection was 11/29/2017.  B12 today and monthly x 6. 5.   Health maintenance  Discuss smoking cessation.  Patient continues surveillance for lung cancer yearly.  Interval chest CT angiogram on 02/12/2018.  Postpone next low dose chest CT until 02/13/2019. 6.   Patient to ER. 7.   RTC in 3 months for CBC with diff (at time of B12 injection). 8.   RTC in 6 months for MD assessment ans labs (CBC with diff) and B12.    Honor Loh, NP 04/26/2018, 1:48 PM   I saw and evaluated the patient, participating in the key portions of the service and reviewing pertinent diagnostic studies and records.  I reviewed the nurse practitioner's note and agree with the findings and the plan.  The assessment and plan were discussed with the patient.   Several questions were asked by the  patient and answered.   Nolon Stalls, MD 04/26/2018,1:48 PM

## 2018-04-26 NOTE — Progress Notes (Signed)
No new changes noted today

## 2018-04-27 LAB — HEPATITIS B CORE ANTIBODY, TOTAL: Hep B Core Total Ab: NEGATIVE

## 2018-05-17 DIAGNOSIS — I272 Pulmonary hypertension, unspecified: Secondary | ICD-10-CM | POA: Diagnosis not present

## 2018-05-17 DIAGNOSIS — Z515 Encounter for palliative care: Secondary | ICD-10-CM | POA: Diagnosis not present

## 2018-05-17 DIAGNOSIS — J439 Emphysema, unspecified: Secondary | ICD-10-CM | POA: Diagnosis not present

## 2018-05-17 DIAGNOSIS — I959 Hypotension, unspecified: Secondary | ICD-10-CM | POA: Diagnosis not present

## 2018-05-17 DIAGNOSIS — J441 Chronic obstructive pulmonary disease with (acute) exacerbation: Secondary | ICD-10-CM | POA: Diagnosis not present

## 2018-05-17 DIAGNOSIS — D61818 Other pancytopenia: Secondary | ICD-10-CM | POA: Diagnosis not present

## 2018-05-17 DIAGNOSIS — J90 Pleural effusion, not elsewhere classified: Secondary | ICD-10-CM | POA: Diagnosis not present

## 2018-05-17 DIAGNOSIS — J9622 Acute and chronic respiratory failure with hypercapnia: Secondary | ICD-10-CM | POA: Diagnosis not present

## 2018-05-17 DIAGNOSIS — G9341 Metabolic encephalopathy: Secondary | ICD-10-CM | POA: Diagnosis not present

## 2018-05-17 DIAGNOSIS — R918 Other nonspecific abnormal finding of lung field: Secondary | ICD-10-CM | POA: Diagnosis not present

## 2018-05-17 DIAGNOSIS — J9811 Atelectasis: Secondary | ICD-10-CM | POA: Diagnosis not present

## 2018-05-17 DIAGNOSIS — Z66 Do not resuscitate: Secondary | ICD-10-CM | POA: Diagnosis not present

## 2018-05-17 DIAGNOSIS — G92 Toxic encephalopathy: Secondary | ICD-10-CM | POA: Diagnosis not present

## 2018-05-17 DIAGNOSIS — J9621 Acute and chronic respiratory failure with hypoxia: Secondary | ICD-10-CM | POA: Diagnosis not present

## 2018-05-17 DIAGNOSIS — E871 Hypo-osmolality and hyponatremia: Secondary | ICD-10-CM | POA: Diagnosis not present

## 2018-05-17 DIAGNOSIS — R911 Solitary pulmonary nodule: Secondary | ICD-10-CM | POA: Diagnosis not present

## 2018-05-17 DIAGNOSIS — J449 Chronic obstructive pulmonary disease, unspecified: Secondary | ICD-10-CM | POA: Diagnosis not present

## 2018-05-17 DIAGNOSIS — D696 Thrombocytopenia, unspecified: Secondary | ICD-10-CM | POA: Diagnosis not present

## 2018-05-17 DIAGNOSIS — F17210 Nicotine dependence, cigarettes, uncomplicated: Secondary | ICD-10-CM | POA: Diagnosis not present

## 2018-05-24 ENCOUNTER — Other Ambulatory Visit: Payer: Self-pay | Admitting: Family Medicine

## 2018-05-24 DIAGNOSIS — R1011 Right upper quadrant pain: Secondary | ICD-10-CM | POA: Diagnosis not present

## 2018-05-24 DIAGNOSIS — R571 Hypovolemic shock: Secondary | ICD-10-CM | POA: Diagnosis not present

## 2018-05-24 DIAGNOSIS — F17210 Nicotine dependence, cigarettes, uncomplicated: Secondary | ICD-10-CM | POA: Diagnosis not present

## 2018-05-24 DIAGNOSIS — N281 Cyst of kidney, acquired: Secondary | ICD-10-CM | POA: Diagnosis not present

## 2018-05-24 DIAGNOSIS — K922 Gastrointestinal hemorrhage, unspecified: Secondary | ICD-10-CM | POA: Diagnosis not present

## 2018-05-24 DIAGNOSIS — J9622 Acute and chronic respiratory failure with hypercapnia: Secondary | ICD-10-CM | POA: Diagnosis not present

## 2018-05-24 DIAGNOSIS — R578 Other shock: Secondary | ICD-10-CM | POA: Diagnosis not present

## 2018-05-24 DIAGNOSIS — D696 Thrombocytopenia, unspecified: Secondary | ICD-10-CM | POA: Diagnosis not present

## 2018-05-24 DIAGNOSIS — E872 Acidosis: Secondary | ICD-10-CM | POA: Diagnosis not present

## 2018-05-24 DIAGNOSIS — R58 Hemorrhage, not elsewhere classified: Secondary | ICD-10-CM | POA: Diagnosis not present

## 2018-05-24 DIAGNOSIS — I5030 Unspecified diastolic (congestive) heart failure: Secondary | ICD-10-CM | POA: Diagnosis not present

## 2018-05-24 DIAGNOSIS — I959 Hypotension, unspecified: Secondary | ICD-10-CM | POA: Diagnosis not present

## 2018-05-24 DIAGNOSIS — I350 Nonrheumatic aortic (valve) stenosis: Secondary | ICD-10-CM | POA: Diagnosis not present

## 2018-05-24 DIAGNOSIS — J449 Chronic obstructive pulmonary disease, unspecified: Secondary | ICD-10-CM | POA: Diagnosis not present

## 2018-05-24 DIAGNOSIS — R52 Pain, unspecified: Secondary | ICD-10-CM | POA: Diagnosis not present

## 2018-05-24 DIAGNOSIS — R0602 Shortness of breath: Secondary | ICD-10-CM | POA: Diagnosis not present

## 2018-05-24 DIAGNOSIS — R1084 Generalized abdominal pain: Secondary | ICD-10-CM | POA: Diagnosis not present

## 2018-05-24 DIAGNOSIS — J9621 Acute and chronic respiratory failure with hypoxia: Secondary | ICD-10-CM | POA: Diagnosis not present

## 2018-05-24 DIAGNOSIS — I5032 Chronic diastolic (congestive) heart failure: Secondary | ICD-10-CM | POA: Diagnosis not present

## 2018-05-24 DIAGNOSIS — I517 Cardiomegaly: Secondary | ICD-10-CM | POA: Diagnosis not present

## 2018-05-24 DIAGNOSIS — Z8719 Personal history of other diseases of the digestive system: Secondary | ICD-10-CM | POA: Diagnosis not present

## 2018-05-24 DIAGNOSIS — K5731 Diverticulosis of large intestine without perforation or abscess with bleeding: Secondary | ICD-10-CM | POA: Diagnosis not present

## 2018-05-24 DIAGNOSIS — D693 Immune thrombocytopenic purpura: Secondary | ICD-10-CM | POA: Diagnosis not present

## 2018-05-24 DIAGNOSIS — J441 Chronic obstructive pulmonary disease with (acute) exacerbation: Secondary | ICD-10-CM | POA: Diagnosis not present

## 2018-05-24 DIAGNOSIS — Z452 Encounter for adjustment and management of vascular access device: Secondary | ICD-10-CM | POA: Diagnosis not present

## 2018-05-24 DIAGNOSIS — D62 Acute posthemorrhagic anemia: Secondary | ICD-10-CM | POA: Diagnosis not present

## 2018-05-24 NOTE — Telephone Encounter (Signed)
Requested Prescriptions  Pending Prescriptions Disp Refills  . tamsulosin (FLOMAX) 0.4 MG CAPS capsule [Pharmacy Med Name: TAMSULOSIN HCL 0.4 MG CAPSULE] 90 capsule 0    Sig: TAKE 1 CAPSULE BY MOUTH EVERY DAY     Urology: Alpha-Adrenergic Blocker Passed - 05/24/2018  2:00 AM      Passed - Last BP in normal range    BP Readings from Last 1 Encounters:  04/26/18 (!) 114/55         Passed - Valid encounter within last 12 months    Recent Outpatient Visits          2 months ago Essential hypertension   [place], Jeannette How, MD   3 months ago Benign prostatic hyperplasia, unspecified whether lower urinary tract symptoms present   [place], Jeannette How, MD   6 months ago Rectal bleeding   [place], [place], [place]   8 months ago Simple chronic bronchitis [facility])   [facility] Trinna Post, PA-C   1 year ago Essential hypertension   [place], Jeannette How, MD      Future Appointments            In 3 months Crissman, Jeannette How, MD [facility]

## 2018-05-27 ENCOUNTER — Inpatient Hospital Stay: Payer: Medicare HMO | Attending: Oncology

## 2018-05-30 MED ORDER — PANTOPRAZOLE SODIUM 40 MG PO TBEC
40.00 | DELAYED_RELEASE_TABLET | ORAL | Status: DC
Start: 2018-05-30 — End: 2018-05-30

## 2018-05-30 MED ORDER — INFLUENZA VAC SPLIT QUAD 0.5 ML IM SUSY
0.50 | PREFILLED_SYRINGE | INTRAMUSCULAR | Status: DC
Start: ? — End: 2018-05-30

## 2018-05-30 MED ORDER — AEROCHAMBER W/FLOWSIGNAL MISC
Status: DC
Start: ? — End: 2018-05-30

## 2018-05-30 MED ORDER — ATORVASTATIN CALCIUM 20 MG PO TABS
20.00 | ORAL_TABLET | ORAL | Status: DC
Start: 2018-05-31 — End: 2018-05-30

## 2018-05-30 MED ORDER — ALBUTEROL SULFATE (2.5 MG/3ML) 0.083% IN NEBU
2.50 | INHALATION_SOLUTION | RESPIRATORY_TRACT | Status: DC
Start: ? — End: 2018-05-30

## 2018-05-30 MED ORDER — OXYCODONE HCL 5 MG PO TABS
5.00 | ORAL_TABLET | ORAL | Status: DC
Start: ? — End: 2018-05-30

## 2018-05-30 MED ORDER — ARFORMOTEROL TARTRATE 15 MCG/2ML IN NEBU
15.00 | INHALATION_SOLUTION | RESPIRATORY_TRACT | Status: DC
Start: 2018-05-30 — End: 2018-05-30

## 2018-05-30 MED ORDER — BUDESONIDE 0.5 MG/2ML IN SUSP
0.50 | RESPIRATORY_TRACT | Status: DC
Start: 2018-05-30 — End: 2018-05-30

## 2018-05-30 MED ORDER — IPRATROPIUM BROMIDE 0.02 % IN SOLN
500.00 | RESPIRATORY_TRACT | Status: DC
Start: 2018-05-30 — End: 2018-05-30

## 2018-05-30 MED ORDER — ACETAMINOPHEN 500 MG PO TABS
1000.00 | ORAL_TABLET | ORAL | Status: DC
Start: 2018-05-30 — End: 2018-05-30

## 2018-05-31 ENCOUNTER — Telehealth: Payer: Self-pay

## 2018-05-31 DIAGNOSIS — J9622 Acute and chronic respiratory failure with hypercapnia: Secondary | ICD-10-CM | POA: Diagnosis not present

## 2018-05-31 DIAGNOSIS — J449 Chronic obstructive pulmonary disease, unspecified: Secondary | ICD-10-CM | POA: Diagnosis not present

## 2018-05-31 NOTE — Telephone Encounter (Signed)
Transition Care Management Follow-up Telephone Call  Date of discharge and from where: 05/30/18 [facility]  How have you been since you were released from the hospital? Pt states he is doing okay, no weakness or pain.   Any questions or concerns? Yes- patient states he was given a CPAP machine and someone came to his house to set it up but doesn't feel like it is working properly. Advised him to contact the company so that he can use it correctly and benefit from it.   Items Reviewed:  Did the pt receive and understand the discharge instructions provided? No   Medications obtained and verified? Yes  pt states he was taken off all blood pressure medication  Any new allergies since your discharge? No   Dietary orders reviewed? Yes  Do you have support at home? Yes   Functional Questionnaire: (I = Independent and D = Dependent) ADLs: I  Bathing/Dressing- I  Meal Prep- I  Eating- I  Maintaining continence- I  Transferring/Ambulation- I  Managing Meds- I  Follow up appointments reviewed:   PCP Hospital f/u appt confirmed? Yes  Scheduled to see Dr. Jeananne Rama on 06/19/18 @ 10:45.  Are transportation arrangements needed? No   If their condition worsens, is the pt aware to call PCP or go to the Emergency Dept.? Yes  Was the patient provided with contact information for the PCP's office or ED? Yes  Was to pt encouraged to call back with questions or concerns? Yes

## 2018-05-31 NOTE — Telephone Encounter (Signed)
HFU scheduled for 06/19/18 @ 10:45 AM with Dr Jeananne Rama.

## 2018-06-01 DIAGNOSIS — K5730 Diverticulosis of large intestine without perforation or abscess without bleeding: Secondary | ICD-10-CM | POA: Diagnosis not present

## 2018-06-01 DIAGNOSIS — J9622 Acute and chronic respiratory failure with hypercapnia: Secondary | ICD-10-CM | POA: Diagnosis not present

## 2018-06-01 DIAGNOSIS — J449 Chronic obstructive pulmonary disease, unspecified: Secondary | ICD-10-CM | POA: Diagnosis not present

## 2018-06-01 DIAGNOSIS — J9621 Acute and chronic respiratory failure with hypoxia: Secondary | ICD-10-CM | POA: Diagnosis not present

## 2018-06-01 DIAGNOSIS — I110 Hypertensive heart disease with heart failure: Secondary | ICD-10-CM | POA: Diagnosis not present

## 2018-06-01 DIAGNOSIS — K922 Gastrointestinal hemorrhage, unspecified: Secondary | ICD-10-CM | POA: Diagnosis not present

## 2018-06-03 DIAGNOSIS — K5730 Diverticulosis of large intestine without perforation or abscess without bleeding: Secondary | ICD-10-CM | POA: Diagnosis not present

## 2018-06-03 DIAGNOSIS — K922 Gastrointestinal hemorrhage, unspecified: Secondary | ICD-10-CM | POA: Diagnosis not present

## 2018-06-03 DIAGNOSIS — I110 Hypertensive heart disease with heart failure: Secondary | ICD-10-CM | POA: Diagnosis not present

## 2018-06-03 DIAGNOSIS — J9621 Acute and chronic respiratory failure with hypoxia: Secondary | ICD-10-CM | POA: Diagnosis not present

## 2018-06-03 DIAGNOSIS — J9622 Acute and chronic respiratory failure with hypercapnia: Secondary | ICD-10-CM | POA: Diagnosis not present

## 2018-06-03 DIAGNOSIS — J449 Chronic obstructive pulmonary disease, unspecified: Secondary | ICD-10-CM | POA: Diagnosis not present

## 2018-06-04 ENCOUNTER — Telehealth: Payer: Self-pay | Admitting: Family Medicine

## 2018-06-04 NOTE — Telephone Encounter (Signed)
Called and gave verbal OK for orders requested per Dr. Wynetta Emery.

## 2018-06-04 NOTE — Telephone Encounter (Signed)
Copied from [place] [id number]. Topic: Quick Communication - Home Health Verbal Orders >> Jun 04, 2018  2:01 PM Lennox Solders wrote: Caller/Agency:lemuel PT [facility] Callback Number: [phone] Requesting PT 2x2, 1 x 2 for gait training ,strengthening , exercises. The originally request came from [facility] pt was in hospital. Pt has an appt with dr Jeananne Rama on 06-19-2018

## 2018-06-04 NOTE — Telephone Encounter (Signed)
OK to give verbal orders

## 2018-06-05 DIAGNOSIS — J9621 Acute and chronic respiratory failure with hypoxia: Secondary | ICD-10-CM | POA: Diagnosis not present

## 2018-06-05 DIAGNOSIS — J449 Chronic obstructive pulmonary disease, unspecified: Secondary | ICD-10-CM | POA: Diagnosis not present

## 2018-06-05 DIAGNOSIS — K5730 Diverticulosis of large intestine without perforation or abscess without bleeding: Secondary | ICD-10-CM | POA: Diagnosis not present

## 2018-06-05 DIAGNOSIS — K922 Gastrointestinal hemorrhage, unspecified: Secondary | ICD-10-CM | POA: Diagnosis not present

## 2018-06-05 DIAGNOSIS — I110 Hypertensive heart disease with heart failure: Secondary | ICD-10-CM | POA: Diagnosis not present

## 2018-06-05 DIAGNOSIS — J9622 Acute and chronic respiratory failure with hypercapnia: Secondary | ICD-10-CM | POA: Diagnosis not present

## 2018-06-07 DIAGNOSIS — K922 Gastrointestinal hemorrhage, unspecified: Secondary | ICD-10-CM | POA: Diagnosis not present

## 2018-06-07 DIAGNOSIS — K5730 Diverticulosis of large intestine without perforation or abscess without bleeding: Secondary | ICD-10-CM | POA: Diagnosis not present

## 2018-06-07 DIAGNOSIS — I110 Hypertensive heart disease with heart failure: Secondary | ICD-10-CM | POA: Diagnosis not present

## 2018-06-07 DIAGNOSIS — J449 Chronic obstructive pulmonary disease, unspecified: Secondary | ICD-10-CM | POA: Diagnosis not present

## 2018-06-07 DIAGNOSIS — J9621 Acute and chronic respiratory failure with hypoxia: Secondary | ICD-10-CM | POA: Diagnosis not present

## 2018-06-07 DIAGNOSIS — J9622 Acute and chronic respiratory failure with hypercapnia: Secondary | ICD-10-CM | POA: Diagnosis not present

## 2018-06-08 DIAGNOSIS — K5730 Diverticulosis of large intestine without perforation or abscess without bleeding: Secondary | ICD-10-CM | POA: Diagnosis not present

## 2018-06-08 DIAGNOSIS — J449 Chronic obstructive pulmonary disease, unspecified: Secondary | ICD-10-CM | POA: Diagnosis not present

## 2018-06-08 DIAGNOSIS — J9622 Acute and chronic respiratory failure with hypercapnia: Secondary | ICD-10-CM | POA: Diagnosis not present

## 2018-06-08 DIAGNOSIS — J9621 Acute and chronic respiratory failure with hypoxia: Secondary | ICD-10-CM | POA: Diagnosis not present

## 2018-06-08 DIAGNOSIS — I110 Hypertensive heart disease with heart failure: Secondary | ICD-10-CM | POA: Diagnosis not present

## 2018-06-08 DIAGNOSIS — K922 Gastrointestinal hemorrhage, unspecified: Secondary | ICD-10-CM | POA: Diagnosis not present

## 2018-06-11 DIAGNOSIS — I272 Pulmonary hypertension, unspecified: Secondary | ICD-10-CM | POA: Diagnosis not present

## 2018-06-11 DIAGNOSIS — J449 Chronic obstructive pulmonary disease, unspecified: Secondary | ICD-10-CM | POA: Diagnosis not present

## 2018-06-12 DIAGNOSIS — I110 Hypertensive heart disease with heart failure: Secondary | ICD-10-CM | POA: Diagnosis not present

## 2018-06-12 DIAGNOSIS — J9622 Acute and chronic respiratory failure with hypercapnia: Secondary | ICD-10-CM | POA: Diagnosis not present

## 2018-06-12 DIAGNOSIS — K5730 Diverticulosis of large intestine without perforation or abscess without bleeding: Secondary | ICD-10-CM | POA: Diagnosis not present

## 2018-06-12 DIAGNOSIS — J9621 Acute and chronic respiratory failure with hypoxia: Secondary | ICD-10-CM | POA: Diagnosis not present

## 2018-06-12 DIAGNOSIS — K922 Gastrointestinal hemorrhage, unspecified: Secondary | ICD-10-CM | POA: Diagnosis not present

## 2018-06-12 DIAGNOSIS — J449 Chronic obstructive pulmonary disease, unspecified: Secondary | ICD-10-CM | POA: Diagnosis not present

## 2018-06-13 ENCOUNTER — Telehealth: Payer: Self-pay | Admitting: Family Medicine

## 2018-06-13 NOTE — Telephone Encounter (Signed)
Copied from [place] [id number]. Topic: General - Other >> Jun 13, 2018 10:08 AM Lennox Solders wrote: Reason for CRM: lemuel PT with [facility] is calling to report he had missed visit with pt yesterday on 06-12-2018 due to pt had bad headache.

## 2018-06-17 DIAGNOSIS — J9621 Acute and chronic respiratory failure with hypoxia: Secondary | ICD-10-CM | POA: Diagnosis not present

## 2018-06-17 DIAGNOSIS — K5730 Diverticulosis of large intestine without perforation or abscess without bleeding: Secondary | ICD-10-CM | POA: Diagnosis not present

## 2018-06-17 DIAGNOSIS — K922 Gastrointestinal hemorrhage, unspecified: Secondary | ICD-10-CM | POA: Diagnosis not present

## 2018-06-17 DIAGNOSIS — I110 Hypertensive heart disease with heart failure: Secondary | ICD-10-CM | POA: Diagnosis not present

## 2018-06-17 DIAGNOSIS — J449 Chronic obstructive pulmonary disease, unspecified: Secondary | ICD-10-CM | POA: Diagnosis not present

## 2018-06-17 DIAGNOSIS — J9622 Acute and chronic respiratory failure with hypercapnia: Secondary | ICD-10-CM | POA: Diagnosis not present

## 2018-06-19 ENCOUNTER — Other Ambulatory Visit: Payer: Self-pay

## 2018-06-19 ENCOUNTER — Telehealth: Payer: Self-pay | Admitting: Family Medicine

## 2018-06-19 ENCOUNTER — Encounter: Payer: Self-pay | Admitting: Family Medicine

## 2018-06-19 ENCOUNTER — Ambulatory Visit (INDEPENDENT_AMBULATORY_CARE_PROVIDER_SITE_OTHER): Payer: Medicare HMO | Admitting: Family Medicine

## 2018-06-19 DIAGNOSIS — D696 Thrombocytopenia, unspecified: Secondary | ICD-10-CM | POA: Diagnosis not present

## 2018-06-19 DIAGNOSIS — E440 Moderate protein-calorie malnutrition: Secondary | ICD-10-CM | POA: Diagnosis not present

## 2018-06-19 DIAGNOSIS — K922 Gastrointestinal hemorrhage, unspecified: Secondary | ICD-10-CM

## 2018-06-19 DIAGNOSIS — R251 Tremor, unspecified: Secondary | ICD-10-CM

## 2018-06-19 DIAGNOSIS — I270 Primary pulmonary hypertension: Secondary | ICD-10-CM | POA: Diagnosis not present

## 2018-06-19 DIAGNOSIS — E46 Unspecified protein-calorie malnutrition: Secondary | ICD-10-CM | POA: Diagnosis not present

## 2018-06-19 MED ORDER — METOPROLOL SUCCINATE ER 25 MG PO TB24: 25.0000 mg | ORAL_TABLET | Freq: Every day | ORAL | 3 refills | Status: DC

## 2018-06-19 NOTE — Assessment & Plan Note (Signed)
Reviewed notes

## 2018-06-19 NOTE — Assessment & Plan Note (Signed)
Discussed diet

## 2018-06-19 NOTE — Progress Notes (Addendum)
BP (!) 142/78 (BP Location: Left Arm, Patient Position: Sitting, Cuff Size: Normal)   Pulse 99   Temp 97.8 F (36.6 C) (Oral)   Ht _0  (1.753 m)   Wt 119 lb (54 kg)   SpO2 90%   BMI 17.57 kg/m    Subjective:    Patient ID: Patrick Ayala, male    DOB: 01/21/1941, 78 y.o.   MRN: [id number]  HPI: Patrick Ayala is a 78 y.o. male  Chief Complaint  Patient presents with  . Hospitalization Follow-up  . Shortness of Breath   Patient accompanied by caregiver all in all doing well has oxygen in place.  Is feeling almost back to normal. No further bleeding are bowel issues. Reviewed discharge summary with recommendation for GI referral. Consideration of hematology of thrombocytopenia still persists. And recommendations for repeating CBC. Patient also concerned about tremor which is so bad that can hardly hold food on a fork or eat soup.  This is been ongoing for some time just getting worse ready to start some medication.  Relevant past medical, surgical, family and social history reviewed and updated as indicated. Interim medical history since our last visit reviewed. Allergies and medications reviewed and updated.  Review of Systems  Constitutional: Negative.   Respiratory: Negative.   Cardiovascular: Negative.     Per HPI unless specifically indicated above     Objective:    BP (!) 142/78 (BP Location: Left Arm, Patient Position: Sitting, Cuff Size: Normal)   Pulse 99   Temp 97.8 F (36.6 C) (Oral)   Ht _1  (1.753 m)   Wt 119 lb (54 kg)   SpO2 90%   BMI 17.57 kg/m   Wt Readings from Last 3 Encounters:  06/19/18 119 lb (54 kg)  04/26/18 118 lb (53.5 kg)  03/18/18 118 lb (53.5 kg)    Physical Exam Constitutional:      Appearance: He is well-developed.  HENT:     Head: Normocephalic and atraumatic.  Eyes:     Conjunctiva/sclera: Conjunctivae normal.  Neck:     Musculoskeletal: Normal range of motion.  Cardiovascular:     Rate and Rhythm: Normal rate and  regular rhythm.     Heart sounds: Normal heart sounds.  Pulmonary:     Effort: Pulmonary effort is normal.     Breath sounds: Normal breath sounds.  Musculoskeletal: Normal range of motion.  Skin:    Findings: No erythema.  Neurological:     Mental Status: He is alert and oriented to person, place, and time.  Psychiatric:        Behavior: Behavior normal.        Thought Content: Thought content normal.        Judgment: Judgment normal.     Results for orders placed or performed in visit on 04/26/18  Hepatitis B core antibody, total  Result Value Ref Range   Hep B Core Total Ab Negative Negative  Folate  Result Value Ref Range   Folate 36.0 >5.9 ng/mL  Ferritin  Result Value Ref Range   Ferritin 115 24 - 336 ng/mL  Comprehensive metabolic panel  Result Value Ref Range   Sodium 139 135 - 145 mmol/L   Potassium 4.4 3.5 - 5.1 mmol/L   Chloride 90 (L) 98 - 111 mmol/L   CO2 40 (H) 22 - 32 mmol/L   Glucose, Bld 103 (H) 70 - 99 mg/dL   BUN 11 8 - 23 mg/dL   Creatinine, Ser  0.57 (L) 0.61 - 1.24 mg/dL   Calcium 9.3 8.9 - 10.3 mg/dL   Total Protein 7.6 6.5 - 8.1 g/dL   Albumin 4.5 3.5 - 5.0 g/dL   AST 24 15 - 41 U/L   ALT 22 0 - 44 U/L   Alkaline Phosphatase 55 38 - 126 U/L   Total Bilirubin 1.0 0.3 - 1.2 mg/dL   GFR calc non Af Amer >60 >60 mL/min   GFR calc Af Amer >60 >60 mL/min   Anion gap 9 5 - 15  CBC with Differential/Platelet  Result Value Ref Range   WBC 9.8 4.0 - 10.5 K/uL   RBC 4.65 4.22 - 5.81 MIL/uL   Hemoglobin 14.9 13.0 - 17.0 g/dL   HCT 47.8 39.0 - 52.0 %   MCV 102.8 (H) 80.0 - 100.0 fL   MCH 32.0 26.0 - 34.0 pg   MCHC 31.2 30.0 - 36.0 g/dL   RDW 13.9 11.5 - 15.5 %   Platelets 107 (L) 150 - 400 K/uL   nRBC 0.0 0.0 - 0.2 %   Neutrophils Relative % 66 %   Neutro Abs 6.6 1.7 - 7.7 K/uL   Lymphocytes Relative 21 %   Lymphs Abs 2.1 0.7 - 4.0 K/uL   Monocytes Relative 10 %   Monocytes Absolute 0.9 0.1 - 1.0 K/uL   Eosinophils Relative 1 %   Eosinophils  Absolute 0.1 0.0 - 0.5 K/uL   Basophils Relative 1 %   Basophils Absolute 0.1 0.0 - 0.1 K/uL   Immature Granulocytes 1 %   Abs Immature Granulocytes 0.05 0.00 - 0.07 K/uL      Assessment & Plan:   Problem List Items Addressed This Visit      Cardiovascular and Mediastinum   Pulmonary hypertension, primary ([place])    Reviewed notes      Relevant Medications   furosemide (LASIX) 20 MG tablet     Digestive   Lower GI bleed    Review discharge summary patient stabilized. We will make referral to GI and check CBC today.      Relevant Orders   CBC with Differential/Platelet   Ambulatory referral to Gastroenterology     Other   Thrombocytopenia [facility])    Reviewed notes      Malnutrition ([place])    Discussed diet           Follow up plan: Return if symptoms worsen or fail to improve, for As scheduled.

## 2018-06-19 NOTE — Assessment & Plan Note (Signed)
Review discharge summary patient stabilized. We will make referral to GI and check CBC today.

## 2018-06-19 NOTE — Telephone Encounter (Signed)
Copied from [place] [phone]. Topic: Quick Communication - Home Health Verbal Orders >> Jun 19, 2018  1:10 PM Lennox Solders wrote: Caller/Agency:[facility] Callback [place]. Pt is  being  discharge from PT and [facility]. Pt does not want to do more physical therapy or exercises

## 2018-06-19 NOTE — Assessment & Plan Note (Signed)
For tremor will add metoprolol which will also help with blood pressure.

## 2018-06-19 NOTE — Addendum Note (Signed)
Addended by: Golden Pop A on: 06/19/2018 11:43 AM   Modules accepted: Orders

## 2018-06-20 ENCOUNTER — Encounter: Payer: Self-pay | Admitting: Family Medicine

## 2018-06-20 LAB — CBC WITH DIFFERENTIAL/PLATELET
BASOS: 0 %
Basophils Absolute: 0 10*3/uL (ref 0.0–0.2)
EOS (ABSOLUTE): 0 10*3/uL (ref 0.0–0.4)
EOS: 1 %
HEMATOCRIT: 33.7 % — AB (ref 37.5–51.0)
Hemoglobin: 10.7 g/dL — ABNORMAL LOW (ref 13.0–17.7)
Immature Grans (Abs): 0 10*3/uL (ref 0.0–0.1)
Immature Granulocytes: 0 %
Lymphocytes Absolute: 1.1 10*3/uL (ref 0.7–3.1)
Lymphs: 16 %
MCH: 29.6 pg (ref 26.6–33.0)
MCHC: 31.8 g/dL (ref 31.5–35.7)
MCV: 93 fL (ref 79–97)
Monocytes Absolute: 0.7 10*3/uL (ref 0.1–0.9)
Monocytes: 10 %
NEUTROS ABS: 4.9 10*3/uL (ref 1.4–7.0)
Neutrophils: 73 %
Platelets: 105 10*3/uL — ABNORMAL LOW (ref 150–450)
RBC: 3.61 x10E6/uL — ABNORMAL LOW (ref 4.14–5.80)
RDW: 14.4 % (ref 11.6–15.4)
WBC: 6.7 10*3/uL (ref 3.4–10.8)

## 2018-06-24 ENCOUNTER — Inpatient Hospital Stay: Payer: Medicare HMO | Attending: Oncology

## 2018-06-25 ENCOUNTER — Telehealth: Payer: Self-pay | Admitting: Family Medicine

## 2018-06-25 NOTE — Telephone Encounter (Signed)
Copied from [place] [id number]. Topic: Quick Communication - See Telephone Encounter >> Jun 25, 2018  9:55 AM Sheran Luz wrote: CRM for notification. See Telephone encounter for: 06/25/18.  Patient is calling to check status of 3/11 lab results. Please advise.

## 2018-06-25 NOTE — Telephone Encounter (Signed)
Called and spoke to patient. Letter was printed 06/20/18. Read patient Dr. Rance Muir message on letter. Patient verbalized understanding.

## 2018-06-29 DIAGNOSIS — J449 Chronic obstructive pulmonary disease, unspecified: Secondary | ICD-10-CM | POA: Diagnosis not present

## 2018-06-29 DIAGNOSIS — J9622 Acute and chronic respiratory failure with hypercapnia: Secondary | ICD-10-CM | POA: Diagnosis not present

## 2018-07-01 ENCOUNTER — Telehealth: Payer: Self-pay

## 2018-07-03 ENCOUNTER — Ambulatory Visit: Payer: Self-pay | Admitting: *Deleted

## 2018-07-03 ENCOUNTER — Telehealth: Payer: Self-pay

## 2018-07-03 NOTE — Chronic Care Management (AMB) (Signed)
  Chronic Care Management   Note  07/03/2018 Name: Patrick Ayala MRN: [id number] DOB: February 09, 1941  Unsuccessful telephone outreach to [name] to follow up on referral placed by Golden Pop, MD. Left a HIPAA compliant VM on patient's listed telephone number. Attempted to call spouse/DPR, no answer and unable to leave a VM.  Follow up plan: The CM team will reach out to the patient again over the next 7 days.  Merlene Morse Royal Beirne RN, BSN Nurse Case Editor, commissioning Family Practice/[facility] Care Management  [phone]) Business Mobile

## 2018-07-05 ENCOUNTER — Ambulatory Visit: Payer: Self-pay | Admitting: *Deleted

## 2018-07-05 NOTE — Chronic Care Management (AMB) (Signed)
  Chronic Care Management   Note  07/05/2018 Name: Patrick Ayala MRN: [id number] DOB: 1941/02/23  Unsuccessful telephone outreach to Mr. Ridings to follow up on referral placed by Golden Pop, MD. Left a HIPAA compliant VM on patient's listed telephone number.   Follow up plan: The CM team will reach out to the patient again over the next 7 days.   Merlene Morse Averill Pons RN, BSN Nurse Case Editor, commissioning Family Practice/[facility] Care Management  [phone]) Business Mobile

## 2018-07-09 ENCOUNTER — Telehealth: Payer: Self-pay

## 2018-07-09 ENCOUNTER — Other Ambulatory Visit: Payer: Self-pay

## 2018-07-09 ENCOUNTER — Ambulatory Visit: Payer: Self-pay | Admitting: *Deleted

## 2018-07-09 DIAGNOSIS — J410 Simple chronic bronchitis: Secondary | ICD-10-CM

## 2018-07-09 DIAGNOSIS — I10 Essential (primary) hypertension: Secondary | ICD-10-CM

## 2018-07-09 NOTE — Chronic Care Management (AMB) (Signed)
  Chronic Care Management   Telephone Outreach Note  07/09/2018 Name: Patrick Ayala MRN: [id number] DOB: Nov 12, 1940  Referred by: PCP, Dr.Crissman and patient's health plan.   I reached out to Mr. Patrick Ayala today by phone in response to a referral sent by Mr. Patrick Ayala's health plan. Mr. Patrick Ayala and I briefly discussed care management needs related to COPD, HTN, and recent hospital admissions.    Patrick Ayala was given information about Chronic Care Management services today including:  1. CCM service includes personalized support from designated clinical staff supervised by his physician, including individualized plan of care and coordination with other care providers 2. 24/7 contact phone numbers for assistance for urgent and routine care needs. 3. Service will only be billed when office clinical staff spend 20 minutes or more in a month to coordinate care. 4. Only one practitioner may furnish and bill the service in a calendar month. 5. The patient may stop CCM services at any time (effective at the end of the month) by phone call to the office staff. 6. The patient will be responsible for cost sharing (co-pay) of up to 20% of the service fee (after annual deductible is met).  Patient did not agree to services and wishes to consider information provided before deciding about enrollment in CCM services.   The CM team will reach out to the patient again over the next 7 days.    Patrick Maple, MD has been notified of this outreach and Patrick Ayala decision and plan.   Merlene Morse Mackinley Cassaday RN, BSN Nurse Case Editor, commissioning Family Practice/[facility] Care Management  [phone]) Business Mobile

## 2018-07-12 ENCOUNTER — Ambulatory Visit: Payer: Medicare HMO | Admitting: Gastroenterology

## 2018-07-18 ENCOUNTER — Ambulatory Visit: Payer: Self-pay | Admitting: *Deleted

## 2018-07-18 ENCOUNTER — Telehealth: Payer: Self-pay

## 2018-07-18 NOTE — Chronic Care Management (AMB) (Signed)
  Chronic Care Management   Note  07/18/2018 Name: SIRR KABEL MRN: [id number] DOB: May 27, 1940  Placed a call to Mr. Roney Marion patient of Guadalupe Maple, MD today to discuss goals for managing their chronic condition of COPD, HTN,GI Bleed and Malnutrion . Unable to reach patient at this time, was able to leave a HIPAA compliant voice mail and requested a return call.   Follow up plan: The CM team will reach out to the patient again over the next 7 days.   Merlene Morse Khyren Hing RN, BSN Nurse Case Editor, commissioning Family Practice/[facility] Care Management  [phone]) Business Mobile

## 2018-07-19 DIAGNOSIS — E785 Hyperlipidemia, unspecified: Secondary | ICD-10-CM | POA: Diagnosis not present

## 2018-07-19 DIAGNOSIS — N400 Enlarged prostate without lower urinary tract symptoms: Secondary | ICD-10-CM | POA: Diagnosis not present

## 2018-07-19 DIAGNOSIS — K219 Gastro-esophageal reflux disease without esophagitis: Secondary | ICD-10-CM | POA: Diagnosis not present

## 2018-07-19 DIAGNOSIS — J439 Emphysema, unspecified: Secondary | ICD-10-CM | POA: Diagnosis not present

## 2018-07-19 DIAGNOSIS — I10 Essential (primary) hypertension: Secondary | ICD-10-CM | POA: Diagnosis not present

## 2018-07-24 ENCOUNTER — Other Ambulatory Visit: Payer: Self-pay

## 2018-07-25 ENCOUNTER — Inpatient Hospital Stay: Payer: Medicare HMO

## 2018-07-26 ENCOUNTER — Telehealth: Payer: Self-pay

## 2018-07-30 DIAGNOSIS — J9622 Acute and chronic respiratory failure with hypercapnia: Secondary | ICD-10-CM | POA: Diagnosis not present

## 2018-07-30 DIAGNOSIS — J449 Chronic obstructive pulmonary disease, unspecified: Secondary | ICD-10-CM | POA: Diagnosis not present

## 2018-08-02 ENCOUNTER — Telehealth: Payer: Self-pay

## 2018-08-03 DIAGNOSIS — N400 Enlarged prostate without lower urinary tract symptoms: Secondary | ICD-10-CM | POA: Diagnosis not present

## 2018-08-03 DIAGNOSIS — R0602 Shortness of breath: Secondary | ICD-10-CM | POA: Diagnosis not present

## 2018-08-03 DIAGNOSIS — E872 Acidosis: Secondary | ICD-10-CM | POA: Diagnosis not present

## 2018-08-03 DIAGNOSIS — Z03818 Encounter for observation for suspected exposure to other biological agents ruled out: Secondary | ICD-10-CM | POA: Diagnosis not present

## 2018-08-03 DIAGNOSIS — H532 Diplopia: Secondary | ICD-10-CM | POA: Diagnosis not present

## 2018-08-03 DIAGNOSIS — J9611 Chronic respiratory failure with hypoxia: Secondary | ICD-10-CM | POA: Diagnosis not present

## 2018-08-03 DIAGNOSIS — J449 Chronic obstructive pulmonary disease, unspecified: Secondary | ICD-10-CM | POA: Diagnosis not present

## 2018-08-03 DIAGNOSIS — K922 Gastrointestinal hemorrhage, unspecified: Secondary | ICD-10-CM | POA: Diagnosis not present

## 2018-08-03 DIAGNOSIS — Z20828 Contact with and (suspected) exposure to other viral communicable diseases: Secondary | ICD-10-CM | POA: Diagnosis not present

## 2018-08-03 DIAGNOSIS — I10 Essential (primary) hypertension: Secondary | ICD-10-CM | POA: Diagnosis not present

## 2018-08-03 DIAGNOSIS — I639 Cerebral infarction, unspecified: Secondary | ICD-10-CM | POA: Diagnosis not present

## 2018-08-03 DIAGNOSIS — R0902 Hypoxemia: Secondary | ICD-10-CM | POA: Diagnosis not present

## 2018-08-03 DIAGNOSIS — R270 Ataxia, unspecified: Secondary | ICD-10-CM | POA: Diagnosis not present

## 2018-08-03 DIAGNOSIS — J9612 Chronic respiratory failure with hypercapnia: Secondary | ICD-10-CM | POA: Diagnosis not present

## 2018-08-03 DIAGNOSIS — I6389 Other cerebral infarction: Secondary | ICD-10-CM | POA: Diagnosis not present

## 2018-08-03 DIAGNOSIS — Z681 Body mass index (BMI) 19 or less, adult: Secondary | ICD-10-CM | POA: Diagnosis not present

## 2018-08-03 DIAGNOSIS — E46 Unspecified protein-calorie malnutrition: Secondary | ICD-10-CM | POA: Diagnosis not present

## 2018-08-03 DIAGNOSIS — R42 Dizziness and giddiness: Secondary | ICD-10-CM | POA: Diagnosis not present

## 2018-08-03 DIAGNOSIS — E785 Hyperlipidemia, unspecified: Secondary | ICD-10-CM | POA: Diagnosis not present

## 2018-08-03 DIAGNOSIS — I270 Primary pulmonary hypertension: Secondary | ICD-10-CM | POA: Diagnosis not present

## 2018-08-03 DIAGNOSIS — I6523 Occlusion and stenosis of bilateral carotid arteries: Secondary | ICD-10-CM | POA: Diagnosis not present

## 2018-08-03 DIAGNOSIS — R918 Other nonspecific abnormal finding of lung field: Secondary | ICD-10-CM | POA: Diagnosis not present

## 2018-08-06 MED ORDER — PANTOPRAZOLE SODIUM 40 MG PO TBEC
40.00 | DELAYED_RELEASE_TABLET | ORAL | Status: DC
Start: 2018-08-07 — End: 2018-08-06

## 2018-08-06 MED ORDER — ATORVASTATIN CALCIUM 20 MG PO TABS
20.00 | ORAL_TABLET | ORAL | Status: DC
Start: 2018-08-07 — End: 2018-08-06

## 2018-08-06 MED ORDER — ALBUTEROL SULFATE (2.5 MG/3ML) 0.083% IN NEBU
2.50 | INHALATION_SOLUTION | RESPIRATORY_TRACT | Status: DC
Start: ? — End: 2018-08-06

## 2018-08-06 MED ORDER — UMECLIDINIUM BROMIDE 62.5 MCG/INH IN AEPB
1.00 | INHALATION_SPRAY | RESPIRATORY_TRACT | Status: DC
Start: 2018-08-07 — End: 2018-08-06

## 2018-08-06 MED ORDER — TAMSULOSIN HCL 0.4 MG PO CAPS
0.40 | ORAL_CAPSULE | ORAL | Status: DC
Start: 2018-08-06 — End: 2018-08-06

## 2018-08-06 MED ORDER — FLUTICASONE FUROATE-VILANTEROL 100-25 MCG/INH IN AEPB
1.00 | INHALATION_SPRAY | RESPIRATORY_TRACT | Status: DC
Start: 2018-08-07 — End: 2018-08-06

## 2018-08-06 MED ORDER — ASPIRIN 81 MG PO CHEW
81.00 | CHEWABLE_TABLET | ORAL | Status: DC
Start: 2018-08-07 — End: 2018-08-06

## 2018-08-06 MED ORDER — ACETAMINOPHEN 325 MG PO TABS
650.00 | ORAL_TABLET | ORAL | Status: DC
Start: ? — End: 2018-08-06

## 2018-08-06 MED ORDER — ENOXAPARIN SODIUM 40 MG/0.4ML ~~LOC~~ SOLN
40.00 | SUBCUTANEOUS | Status: DC
Start: 2018-08-07 — End: 2018-08-06

## 2018-08-07 ENCOUNTER — Ambulatory Visit (INDEPENDENT_AMBULATORY_CARE_PROVIDER_SITE_OTHER): Payer: Medicare HMO | Admitting: Family Medicine

## 2018-08-07 ENCOUNTER — Encounter: Payer: Self-pay | Admitting: Family Medicine

## 2018-08-07 ENCOUNTER — Other Ambulatory Visit: Payer: Self-pay

## 2018-08-07 DIAGNOSIS — I10 Essential (primary) hypertension: Secondary | ICD-10-CM | POA: Diagnosis not present

## 2018-08-07 DIAGNOSIS — I639 Cerebral infarction, unspecified: Secondary | ICD-10-CM | POA: Diagnosis not present

## 2018-08-07 MED ORDER — ACETAMINOPHEN 325 MG PO TABS
650.00 | ORAL_TABLET | ORAL | Status: DC
Start: ? — End: 2018-08-07

## 2018-08-07 MED ORDER — ATORVASTATIN CALCIUM 20 MG PO TABS
20.00 | ORAL_TABLET | ORAL | Status: DC
Start: 2018-08-07 — End: 2018-08-07

## 2018-08-07 MED ORDER — ASPIRIN 81 MG PO CHEW
81.00 | CHEWABLE_TABLET | ORAL | Status: DC
Start: 2018-08-07 — End: 2018-08-07

## 2018-08-07 MED ORDER — PANTOPRAZOLE SODIUM 40 MG PO TBEC
40.00 | DELAYED_RELEASE_TABLET | ORAL | Status: DC
Start: 2018-08-07 — End: 2018-08-07

## 2018-08-07 MED ORDER — TAMSULOSIN HCL 0.4 MG PO CAPS
0.40 | ORAL_CAPSULE | ORAL | Status: DC
Start: 2018-08-06 — End: 2018-08-07

## 2018-08-07 MED ORDER — ALBUTEROL SULFATE (2.5 MG/3ML) 0.083% IN NEBU
2.50 | INHALATION_SOLUTION | RESPIRATORY_TRACT | Status: DC
Start: ? — End: 2018-08-07

## 2018-08-07 MED ORDER — ENOXAPARIN SODIUM 40 MG/0.4ML ~~LOC~~ SOLN
40.00 | SUBCUTANEOUS | Status: DC
Start: 2018-08-07 — End: 2018-08-07

## 2018-08-07 MED ORDER — FLUTICASONE FUROATE-VILANTEROL 100-25 MCG/INH IN AEPB
1.00 | INHALATION_SPRAY | RESPIRATORY_TRACT | Status: DC
Start: 2018-08-07 — End: 2018-08-07

## 2018-08-07 MED ORDER — UMECLIDINIUM BROMIDE 62.5 MCG/INH IN AEPB
1.00 | INHALATION_SPRAY | RESPIRATORY_TRACT | Status: DC
Start: 2018-08-07 — End: 2018-08-07

## 2018-08-07 NOTE — Assessment & Plan Note (Signed)
Reviewed stroke signs and symptoms with patient discussed risk factors patient is already quit smoking. Patient taking aspirin without problems no further stroke signs or symptoms.

## 2018-08-07 NOTE — Assessment & Plan Note (Signed)
The current medical regimen is effective;  continue present plan and medications. a

## 2018-08-07 NOTE — Progress Notes (Signed)
There were no vitals taken for this visit.   Subjective:    Patient ID: Patrick Ayala, male    DOB: 1940/09/13, 78 y.o.   MRN: [id number]  HPI: Patrick Ayala is a 78 y.o. male  Hospital F/U Telemedicine using audio/video telecommunications for a synchronous communication visit. Today's visit due to COVID-19 isolation precautions I connected with and verified that I am speaking with the correct person using two identifiers.   I discussed the limitations, risks, security and privacy concerns of performing an evaluation and management service by telecommunication and the availability of in person appointments. I also discussed with the patient that there may be a patient responsible charge related to this service. The patient expressed understanding and agreed to proceed. The patient's location is home. I am at home.  Discussed with patient stroke symptoms have completely resolved no residual weakness no vision complaints or problems.  Taking aspirin without problems. Patient has quit smoking as encouraged by the hospital and his blood pressure is apparently doing well. Reviewed hospital notes and discharge summary from [facility] as noted below.  Transition of Care Hospital Follow up.   Hospital/Facility:[facility] D/C Physician: Nancy Fetter, MD D/C Date: 08-06-18  Records Requested: na Records Received: na Records Reviewed: today  Diagnoses on Discharge: stroke  Date of interactive Contact within 48 hours of discharge: 08-07-18 Contact was through:  Telemedicine using audio/video telecommunications for a synchronous communication visit.  Date of 7 day or 14 day face-to-face visit:    08-07-18  Outpatient Encounter Medications as of 08/07/2018  Medication Sig Note  . acetaminophen (TYLENOL) 325 MG tablet Take 2 tablets (650 mg total) by mouth every 6 (six) hours as needed for mild pain (or Fever >/= 101). 06/19/2018: PRN  . albuterol (PROVENTIL HFA;VENTOLIN HFA) 108 (90 Base) MCG/ACT  inhaler Inhale into the lungs.   Marland Kitchen atorvastatin (LIPITOR) 20 MG tablet TAKE 1 TABLET EVERY DAY FOR HYPERCHOLESTEROLEMIA (Patient taking differently: Take 20 mg by mouth daily. )   . budesonide-formoterol (SYMBICORT) 160-4.5 MCG/ACT inhaler Inhale 2 puffs into the lungs every morning. INHALE 2 TIMES DAILY (Patient taking differently: Inhale 2 puffs into the lungs 2 (two) times daily. )   . feeding supplement, ENSURE ENLIVE, (ENSURE ENLIVE) LIQD Take 237 mLs by mouth 3 (three) times daily between meals.   . fluticasone (FLONASE) 50 MCG/ACT nasal spray    . furosemide (LASIX) 20 MG tablet TAKE 1 TABLET EVERY TUESDAY AND THURSDAY IN THE MORNING   . metoprolol succinate (TOPROL-XL) 25 MG 24 hr tablet Take 1 tablet (25 mg total) by mouth daily.   . Multiple Vitamin (MULTIVITAMIN WITH MINERALS) TABS tablet Take 1 tablet by mouth daily.   Marland Kitchen omeprazole (PRILOSEC) 20 MG capsule    . Spacer/Aero-Holding Chambers (AEROCHAMBER W/FLOWSIGNAL) inhaler Inhale into the lungs.   . SPIRIVA HANDIHALER 18 MCG inhalation capsule    . tamsulosin (FLOMAX) 0.4 MG CAPS capsule TAKE 1 CAPSULE BY MOUTH EVERY DAY   . vitamin C (ASCORBIC ACID) 250 MG tablet Take 250 mg by mouth 2 (two) times daily.   . [DISCONTINUED] acetaminophen (TYLENOL) tablet    . [DISCONTINUED] albuterol (PROVENTIL) (2.5 MG/3ML) 0.083% nebulizer solution    . [DISCONTINUED] aspirin chewable tablet    . [DISCONTINUED] atorvastatin (LIPITOR) tablet    . [DISCONTINUED] enoxaparin (LOVENOX) injection    . [DISCONTINUED] fluticasone furoate-vilanterol (BREO ELLIPTA) 100-25 MCG/INH    . [DISCONTINUED] pantoprazole (PROTONIX) EC tablet    . [DISCONTINUED] tamsulosin (FLOMAX) capsule    . [  DISCONTINUED] umeclidinium bromide (INCRUSE ELLIPTA) 62.5 MCG/INH     No facility-administered encounter medications on file as of 08/07/2018.     Diagnostic Tests Reviewed/Disposition: stroke  Consults:[facility] neurology  Discharge Instructions reviewed with patient from  chart.  Disease/illness Education: Discussed and reviewed with patient  Home Health/Community Services Discussions/Referrals:done  Establishment or re-establishment of referral orders for community resources:done  Discussion with other health care providers:na  Assessment and Support of treatment regimen adherence:discussed  Appointments Coordinated with: na  Education for self-management, independent living, and ADLs: done Relevant past medical, surgical, family and social history reviewed and updated as indicated. Interim medical history since our last visit reviewed. Allergies and medications reviewed and updated.  Review of Systems  Constitutional: Negative.   Respiratory: Negative.   Cardiovascular: Negative.     Per HPI unless specifically indicated above     Objective:    There were no vitals taken for this visit.  Wt Readings from Last 3 Encounters:  06/19/18 119 lb (54 kg)  04/26/18 118 lb (53.5 kg)  03/18/18 118 lb (53.5 kg)    Physical Exam  Results for orders placed or performed in visit on 06/19/18  CBC with Differential/Platelet  Result Value Ref Range   WBC 6.7 3.4 - 10.8 x10E3/uL   RBC 3.61 (L) 4.14 - 5.80 x10E6/uL   Hemoglobin 10.7 (L) 13.0 - 17.7 g/dL   Hematocrit 33.7 (L) 37.5 - 51.0 %   MCV 93 79 - 97 fL   MCH 29.6 26.6 - 33.0 pg   MCHC 31.8 31.5 - 35.7 g/dL   RDW 14.4 11.6 - 15.4 %   Platelets 105 (L) 150 - 450 x10E3/uL   Neutrophils 73 Not Estab. %   Lymphs 16 Not Estab. %   Monocytes 10 Not Estab. %   Eos 1 Not Estab. %   Basos 0 Not Estab. %   Neutrophils Absolute 4.9 1.4 - 7.0 x10E3/uL   Lymphocytes Absolute 1.1 0.7 - 3.1 x10E3/uL   Monocytes Absolute 0.7 0.1 - 0.9 x10E3/uL   EOS (ABSOLUTE) 0.0 0.0 - 0.4 x10E3/uL   Basophils Absolute 0.0 0.0 - 0.2 x10E3/uL   Immature Granulocytes 0 Not Estab. %   Immature Grans (Abs) 0.0 0.0 - 0.1 x10E3/uL      Assessment & Plan:   Problem List Items Addressed This Visit      Cardiovascular  and Mediastinum   Essential hypertension    The current medical regimen is effective;  continue present plan and medications. a      Acute ischemic stroke [facility])    Reviewed stroke signs and symptoms with patient discussed risk factors patient is already quit smoking. Patient taking aspirin without problems no further stroke signs or symptoms.        I discussed the assessment and treatment plan with the patient. The patient was provided an opportunity to ask questions and all were answered. The patient agreed with the plan and demonstrated an understanding of the instructions.   The patient was advised to call back or seek an in-person evaluation if the symptoms worsen or if the condition fails to improve as anticipated.   I provided 21+ minutes of time during this encounter.  Follow up plan: Return in about 4 weeks (around 09/04/2018).

## 2018-08-09 ENCOUNTER — Telehealth: Payer: Self-pay | Admitting: Family Medicine

## 2018-08-09 NOTE — Telephone Encounter (Signed)
Copied from [place] [id number]. Topic: Quick Communication - See Telephone Encounter >> Aug 09, 2018  2:12 PM Sheran Luz wrote: CRM for notification. See Telephone encounter for: 08/09/18.  Otila Kluver, RN with [facility], calling to state that pt has refused all [facility] services.    Tina# [phone]

## 2018-08-15 ENCOUNTER — Telehealth: Payer: Self-pay

## 2018-08-15 ENCOUNTER — Encounter: Payer: Self-pay | Admitting: Gastroenterology

## 2018-08-20 ENCOUNTER — Telehealth: Payer: Self-pay

## 2018-08-21 ENCOUNTER — Ambulatory Visit: Payer: Self-pay | Admitting: *Deleted

## 2018-08-21 ENCOUNTER — Telehealth: Payer: Self-pay

## 2018-08-21 DIAGNOSIS — I639 Cerebral infarction, unspecified: Secondary | ICD-10-CM

## 2018-08-21 NOTE — Chronic Care Management (AMB) (Signed)
  Chronic Care Management   Outreach Note  08/21/2018 Name: Patrick Ayala MRN: [id number] DOB: May 23, 1940  Referred by: Guadalupe Maple, MD Reason for referral : Chronic Care Management (Recent CVA)   Third unsuccessful telephone outreach was attempted today. The patient was referred to the case management team for assistance with chronic care management and care coordination. The patient's primary care provider has been notified of our unsuccessful attempts to make or maintain contact with the patient. The care management team is pleased to engage with this patient at any time in the future should he be interested in assistance from the care management team.   Follow Up Plan: A HIPPA compliant phone message was left for the patient providing contact information and requesting a return call.  The care management team is available to follow up with the patient after provider conversation with the patient regarding recommendation for care management engagement and subsequent re-referral to the care management team.   Eluzer Howdeshell RN, BSN Nurse Case Manager [facility] Care Management  [phone]) Business Mobile

## 2018-08-22 ENCOUNTER — Other Ambulatory Visit: Payer: Self-pay | Admitting: Family Medicine

## 2018-08-23 ENCOUNTER — Inpatient Hospital Stay: Payer: Medicare HMO | Attending: Oncology

## 2018-08-23 ENCOUNTER — Ambulatory Visit: Payer: Medicare HMO | Admitting: Gastroenterology

## 2018-08-29 DIAGNOSIS — J449 Chronic obstructive pulmonary disease, unspecified: Secondary | ICD-10-CM | POA: Diagnosis not present

## 2018-08-29 DIAGNOSIS — J9622 Acute and chronic respiratory failure with hypercapnia: Secondary | ICD-10-CM | POA: Diagnosis not present

## 2018-09-03 ENCOUNTER — Telehealth: Payer: Self-pay | Admitting: Family Medicine

## 2018-09-03 NOTE — Telephone Encounter (Signed)
Copied from [place] [phone]. Topic: Quick Communication - See Telephone Encounter >> Sep 03, 2018 12:26 PM Ivar Drape wrote: CRM for notification. See Telephone encounter for: 09/03/18. Patient would like a refill on his pantoprazole (PROTONIX) 20MG medication and have it sent to [facility] [place].  Patient was given this medication to see if it helped and he stated it does help.  So he would like the provider to send another prescription.

## 2018-09-04 MED ORDER — PANTOPRAZOLE SODIUM 40 MG PO TBEC: 40.0000 mg | DELAYED_RELEASE_TABLET | Freq: Every day | ORAL | 3 refills | Status: DC

## 2018-09-13 ENCOUNTER — Other Ambulatory Visit: Payer: Self-pay | Admitting: Family Medicine

## 2018-09-19 ENCOUNTER — Other Ambulatory Visit: Payer: Self-pay

## 2018-09-19 ENCOUNTER — Encounter: Payer: Self-pay | Admitting: Family Medicine

## 2018-09-19 ENCOUNTER — Ambulatory Visit (INDEPENDENT_AMBULATORY_CARE_PROVIDER_SITE_OTHER): Payer: Medicare HMO | Admitting: Family Medicine

## 2018-09-19 DIAGNOSIS — E440 Moderate protein-calorie malnutrition: Secondary | ICD-10-CM

## 2018-09-19 DIAGNOSIS — I639 Cerebral infarction, unspecified: Secondary | ICD-10-CM

## 2018-09-19 DIAGNOSIS — J410 Simple chronic bronchitis: Secondary | ICD-10-CM

## 2018-09-19 DIAGNOSIS — E7800 Pure hypercholesterolemia, unspecified: Secondary | ICD-10-CM

## 2018-09-19 DIAGNOSIS — I10 Essential (primary) hypertension: Secondary | ICD-10-CM

## 2018-09-19 MED ORDER — TAMSULOSIN HCL 0.4 MG PO CAPS: 0.4000 mg | ORAL_CAPSULE | Freq: Every day | ORAL | 3 refills | Status: DC

## 2018-09-19 MED ORDER — METOPROLOL SUCCINATE ER 25 MG PO TB24: 25.0000 mg | ORAL_TABLET | Freq: Every day | ORAL | 3 refills | Status: DC

## 2018-09-19 MED ORDER — PANTOPRAZOLE SODIUM 40 MG PO TBEC: 40.0000 mg | DELAYED_RELEASE_TABLET | Freq: Every day | ORAL | 3 refills | Status: DC

## 2018-09-19 MED ORDER — ATORVASTATIN CALCIUM 20 MG PO TABS: ORAL_TABLET | ORAL | 3 refills | Status: DC

## 2018-09-19 NOTE — Assessment & Plan Note (Signed)
Reviewed diet

## 2018-09-19 NOTE — Assessment & Plan Note (Signed)
The current medical regimen is effective;  continue present plan and medications.  

## 2018-09-19 NOTE — Assessment & Plan Note (Signed)
Stable doing well without problems

## 2018-09-19 NOTE — Assessment & Plan Note (Addendum)
Reviewed inhaler use

## 2018-09-19 NOTE — Progress Notes (Signed)
There were no vitals taken for this visit.   Subjective:    Patient ID: Patrick Ayala, male    DOB: 03-Aug-1940, 78 y.o.   MRN: [id number]  HPI: Patrick Ayala is a 78 y.o. male  Med check Hospital follow-up for stroke check. Taking aspirin without problems no further stroke signs or symptoms does get a little short of breath with walking outside but unfortunately has not quit smoking wants to have more inhalers. Reviewed not using albuterol inhaler which she has a bottle of at home discussed how to use that prior to exercise. Reviewed taking metoprolol without problems. Protonix and tamsulosin also seems to be working well. Reviewed patient's labs showing hemoglobin slightly dropping after hospitalization will check that again along with BMP.  Relevant past medical, surgical, family and social history reviewed and updated as indicated. Interim medical history since our last visit reviewed. Allergies and medications reviewed and updated.  Review of Systems  Constitutional: Negative.   Respiratory: Positive for shortness of breath.   Cardiovascular: Negative.     Per HPI unless specifically indicated above     Objective:    There were no vitals taken for this visit.  Wt Readings from Last 3 Encounters:  06/19/18 119 lb (54 kg)  04/26/18 118 lb (53.5 kg)  03/18/18 118 lb (53.5 kg)    Physical Exam  Results for orders placed or performed in visit on 06/19/18  CBC with Differential/Platelet  Result Value Ref Range   WBC 6.7 3.4 - 10.8 x10E3/uL   RBC 3.61 (L) 4.14 - 5.80 x10E6/uL   Hemoglobin 10.7 (L) 13.0 - 17.7 g/dL   Hematocrit 33.7 (L) 37.5 - 51.0 %   MCV 93 79 - 97 fL   MCH 29.6 26.6 - 33.0 pg   MCHC 31.8 31.5 - 35.7 g/dL   RDW 14.4 11.6 - 15.4 %   Platelets 105 (L) 150 - 450 x10E3/uL   Neutrophils 73 Not Estab. %   Lymphs 16 Not Estab. %   Monocytes 10 Not Estab. %   Eos 1 Not Estab. %   Basos 0 Not Estab. %   Neutrophils Absolute 4.9 1.4 - 7.0 x10E3/uL   Lymphocytes Absolute 1.1 0.7 - 3.1 x10E3/uL   Monocytes Absolute 0.7 0.1 - 0.9 x10E3/uL   EOS (ABSOLUTE) 0.0 0.0 - 0.4 x10E3/uL   Basophils Absolute 0.0 0.0 - 0.2 x10E3/uL   Immature Granulocytes 0 Not Estab. %   Immature Grans (Abs) 0.0 0.0 - 0.1 x10E3/uL      Assessment & Plan:   Problem List Items Addressed This Visit      Cardiovascular and Mediastinum   Essential hypertension    The current medical regimen is effective;  continue present plan and medications.       Relevant Medications   metoprolol succinate (TOPROL-XL) 25 MG 24 hr tablet   atorvastatin (LIPITOR) 20 MG tablet   Acute ischemic stroke (H[facility])    Stable doing well without problems      Relevant Medications   metoprolol succinate (TOPROL-XL) 25 MG 24 hr tablet   atorvastatin (LIPITOR) 20 MG tablet   Other Relevant Orders   CBC with Differential/Platelet   Basic metabolic panel     Respiratory   COPD (chronic obstructive pulmonary disease) ([place])    Reviewed inhaler use        Other   Hyperlipidemia    The current medical regimen is effective;  continue present plan and medications.  Relevant Medications   metoprolol succinate (TOPROL-XL) 25 MG 24 hr tablet   atorvastatin (LIPITOR) 20 MG tablet   Malnutrition ([place])    Reviewed diet      Relevant Orders   CBC with Differential/Platelet   Basic metabolic panel       Follow up plan: Return in about 3 months (around 12/20/2018).

## 2018-09-23 ENCOUNTER — Telehealth: Payer: Self-pay | Admitting: Oncology

## 2018-09-23 NOTE — Telephone Encounter (Signed)
Called pt for appt pre-screen but got no answer and no vm msg option

## 2018-09-24 ENCOUNTER — Inpatient Hospital Stay: Payer: Medicare HMO | Attending: Oncology

## 2018-09-25 ENCOUNTER — Other Ambulatory Visit: Payer: Medicare HMO

## 2018-09-29 DIAGNOSIS — J9622 Acute and chronic respiratory failure with hypercapnia: Secondary | ICD-10-CM | POA: Diagnosis not present

## 2018-09-29 DIAGNOSIS — J449 Chronic obstructive pulmonary disease, unspecified: Secondary | ICD-10-CM | POA: Diagnosis not present

## 2018-10-01 ENCOUNTER — Other Ambulatory Visit: Payer: Self-pay

## 2018-10-01 ENCOUNTER — Other Ambulatory Visit: Payer: Medicare HMO

## 2018-10-01 DIAGNOSIS — E440 Moderate protein-calorie malnutrition: Secondary | ICD-10-CM | POA: Diagnosis not present

## 2018-10-01 DIAGNOSIS — I639 Cerebral infarction, unspecified: Secondary | ICD-10-CM | POA: Diagnosis not present

## 2018-10-02 LAB — BASIC METABOLIC PANEL
BUN/Creatinine Ratio: 20 (ref 10–24)
BUN: 12 mg/dL (ref 8–27)
CO2: 35 mmol/L — ABNORMAL HIGH (ref 20–29)
Calcium: 9.4 mg/dL (ref 8.6–10.2)
Chloride: 94 mmol/L — ABNORMAL LOW (ref 96–106)
Creatinine, Ser: 0.59 mg/dL — ABNORMAL LOW (ref 0.76–1.27)
GFR calc Af Amer: 114 mL/min/{1.73_m2} (ref >59)
GFR calc non Af Amer: 98 mL/min/{1.73_m2} (ref >59)
Glucose: 97 mg/dL (ref 65–99)
Potassium: 3.9 mmol/L (ref 3.5–5.2)
Sodium: 141 mmol/L (ref 134–144)

## 2018-10-02 LAB — CBC WITH DIFFERENTIAL/PLATELET
Basophils Absolute: 0 10*3/uL (ref 0.0–0.2)
Basos: 0 %
EOS (ABSOLUTE): 0.1 10*3/uL (ref 0.0–0.4)
Eos: 1 %
Hematocrit: 38.9 % (ref 37.5–51.0)
Hemoglobin: 13.2 g/dL (ref 13.0–17.7)
Immature Grans (Abs): 0 10*3/uL (ref 0.0–0.1)
Immature Granulocytes: 0 %
Lymphocytes Absolute: 1.7 10*3/uL (ref 0.7–3.1)
Lymphs: 21 %
MCH: 33.3 pg — ABNORMAL HIGH (ref 26.6–33.0)
MCHC: 33.9 g/dL (ref 31.5–35.7)
MCV: 98 fL — ABNORMAL HIGH (ref 79–97)
Monocytes Absolute: 0.7 10*3/uL (ref 0.1–0.9)
Monocytes: 9 %
Neutrophils Absolute: 5.4 10*3/uL (ref 1.4–7.0)
Neutrophils: 69 %
Platelets: 110 10*3/uL — ABNORMAL LOW (ref 150–450)
RBC: 3.96 x10E6/uL — ABNORMAL LOW (ref 4.14–5.80)
RDW: 13.2 % (ref 11.6–15.4)
WBC: 7.9 10*3/uL (ref 3.4–10.8)

## 2018-10-04 ENCOUNTER — Telehealth: Payer: Self-pay | Admitting: Family Medicine

## 2018-10-04 MED ORDER — BUDESONIDE-FORMOTEROL FUMARATE 160-4.5 MCG/ACT IN AERO: 2.0000 | INHALATION_SPRAY | Freq: Every morning | RESPIRATORY_TRACT | 0 refills | Status: DC

## 2018-10-04 NOTE — Telephone Encounter (Signed)
Medication Refill - Medication:  budesonide-formoterol (SYMBICORT) 160-4.5 MCG/ACT inhaler  Has the patient contacted their pharmacy? Yes advised to call. Patient requesting a call back when it is done.  Preferred Pharmacy (with phone number or street name):  [id number] [place] [address] [id number](Phone) [phone](Fax)   Agent: Please be advised that RX refills may take up to 3 business days. We ask that you follow-up with your pharmacy.

## 2018-10-07 ENCOUNTER — Telehealth: Payer: Self-pay | Admitting: Family Medicine

## 2018-10-07 NOTE — Telephone Encounter (Signed)
Call pt  give him all the Symbicort 80 we have That is if we have any

## 2018-10-07 NOTE — Telephone Encounter (Signed)
Patient notified of sample ready for pick up.

## 2018-10-07 NOTE — Telephone Encounter (Signed)
Patrick Ayala pt's wife presented in office wanting a sample of symbicort. We do not have the dosage in office. Pt's wife states that pharmacy would not be able to fill until next month and pt is needing something. Please advise.

## 2018-10-07 NOTE — Telephone Encounter (Signed)
Patient's wife picked up the symbicort 80/4.5 mcg sample.

## 2018-10-10 ENCOUNTER — Other Ambulatory Visit: Payer: Self-pay | Admitting: Family Medicine

## 2018-10-10 NOTE — Telephone Encounter (Signed)
Forwarding medication refill to PCP for review.

## 2018-10-18 DIAGNOSIS — N401 Enlarged prostate with lower urinary tract symptoms: Secondary | ICD-10-CM | POA: Diagnosis not present

## 2018-10-18 DIAGNOSIS — I110 Hypertensive heart disease with heart failure: Secondary | ICD-10-CM | POA: Diagnosis not present

## 2018-10-18 DIAGNOSIS — J449 Chronic obstructive pulmonary disease, unspecified: Secondary | ICD-10-CM | POA: Diagnosis not present

## 2018-10-18 DIAGNOSIS — Z8673 Personal history of transient ischemic attack (TIA), and cerebral infarction without residual deficits: Secondary | ICD-10-CM | POA: Diagnosis not present

## 2018-10-18 DIAGNOSIS — R1031 Right lower quadrant pain: Secondary | ICD-10-CM | POA: Diagnosis not present

## 2018-10-18 DIAGNOSIS — R3912 Poor urinary stream: Secondary | ICD-10-CM | POA: Diagnosis not present

## 2018-10-18 DIAGNOSIS — R39198 Other difficulties with micturition: Secondary | ICD-10-CM | POA: Diagnosis not present

## 2018-10-18 DIAGNOSIS — I509 Heart failure, unspecified: Secondary | ICD-10-CM | POA: Diagnosis not present

## 2018-10-18 DIAGNOSIS — Z87891 Personal history of nicotine dependence: Secondary | ICD-10-CM | POA: Diagnosis not present

## 2018-10-18 DIAGNOSIS — R338 Other retention of urine: Secondary | ICD-10-CM | POA: Diagnosis not present

## 2018-10-18 DIAGNOSIS — N200 Calculus of kidney: Secondary | ICD-10-CM | POA: Diagnosis not present

## 2018-10-20 ENCOUNTER — Other Ambulatory Visit: Payer: Self-pay | Admitting: Family Medicine

## 2018-10-20 DIAGNOSIS — I10 Essential (primary) hypertension: Secondary | ICD-10-CM

## 2018-10-20 NOTE — Telephone Encounter (Signed)
Pt requesting change of pharmacy Requested Prescriptions  Pending Prescriptions Disp Refills  . metoprolol succinate (TOPROL-XL) 25 MG 24 hr tablet [Pharmacy Med Name: METOPROLOL SUCC ER 25 MG TAB] 90 tablet 2    Sig: TAKE 1 TABLET BY MOUTH EVERY DAY     Cardiovascular:  Beta Blockers Failed - 10/20/2018  9:45 AM      Failed - Last BP in normal range    BP Readings from Last 1 Encounters:  06/19/18 (!) 142/78         Passed - Last Heart Rate in normal range    Pulse Readings from Last 1 Encounters:  06/19/18 99         Passed - Valid encounter within last 6 months    Recent Outpatient Visits          1 month ago Pure hypercholesterolemia   [facility], Jeannette How, MD   2 months ago Acute ischemic stroke [facility])   [facility] Crissman, Jeannette How, MD   4 months ago Lower GI bleed   [place] Crissman, Jeannette How, MD   7 months ago Essential hypertension   [place] Crissman, Mark A, MD   8 months ago Benign prostatic hyperplasia, unspecified whether lower urinary tract symptoms present   [place], Jeannette How, MD      Future Appointments            In 2 months Cr[facility], Jeannette How, MD [facility]

## 2018-10-24 ENCOUNTER — Ambulatory Visit: Payer: Medicare HMO

## 2018-10-25 ENCOUNTER — Inpatient Hospital Stay: Payer: Medicare HMO

## 2018-10-25 ENCOUNTER — Inpatient Hospital Stay: Payer: Medicare HMO | Admitting: Oncology

## 2018-10-25 ENCOUNTER — Inpatient Hospital Stay: Payer: Medicare HMO | Attending: Oncology

## 2018-10-29 DIAGNOSIS — J449 Chronic obstructive pulmonary disease, unspecified: Secondary | ICD-10-CM | POA: Diagnosis not present

## 2018-10-29 DIAGNOSIS — J9622 Acute and chronic respiratory failure with hypercapnia: Secondary | ICD-10-CM | POA: Diagnosis not present

## 2018-10-30 ENCOUNTER — Ambulatory Visit (INDEPENDENT_AMBULATORY_CARE_PROVIDER_SITE_OTHER): Payer: Medicare HMO | Admitting: Nurse Practitioner

## 2018-10-30 ENCOUNTER — Other Ambulatory Visit: Payer: Self-pay

## 2018-10-30 ENCOUNTER — Encounter: Payer: Self-pay | Admitting: Nurse Practitioner

## 2018-10-30 DIAGNOSIS — H6982 Other specified disorders of Eustachian tube, left ear: Secondary | ICD-10-CM | POA: Diagnosis not present

## 2018-10-30 DIAGNOSIS — H6992 Unspecified Eustachian tube disorder, left ear: Secondary | ICD-10-CM | POA: Insufficient documentation

## 2018-10-30 MED ORDER — PREDNISONE 20 MG PO TABS: 20.0000 mg | ORAL_TABLET | Freq: Every day | ORAL | 0 refills | Status: AC

## 2018-10-30 NOTE — Progress Notes (Signed)
BP (!) 168/70   Pulse 87   Temp 98.4 F (36.9 C) (Oral)   Ht 6' (1.829 m)   Wt 115 lb (52.2 kg)   SpO2 95%   BMI 15.60 kg/m    Subjective:    Patient ID: Patrick Ayala, male    DOB: [birth year], 78 y.o.   MRN: [id number]  HPI: Patrick Ayala is a 78 y.o. male  Chief Complaint  Patient presents with  . Ear Pain   EAR PAIN Has been present x 2 weeks.  When he blows his nose his left ear pops all the time.  He denies pain.  Uses hair pins to clean ears, has been doing this all his life.   Duration: weeks Involved ear(s): left Severity:  denies pain, reports only popping  Quality: popping sensation Fever: no Otorrhea: no Upper respiratory infection symptoms: no Pruritus: no Hearing loss: no Water immersion no Using Q-tips: no Recurrent otitis media: no Status: stable Treatments attempted: none  Relevant past medical, surgical, family and social history reviewed and updated as indicated. Interim medical history since our last visit reviewed. Allergies and medications reviewed and updated.  Review of Systems  Constitutional: Negative for activity change, diaphoresis, fatigue and fever.  HENT: Negative for congestion, ear discharge, ear pain, postnasal drip, rhinorrhea, sinus pressure, sinus pain, sneezing and sore throat.   Respiratory: Negative for cough.   Cardiovascular: Negative for chest pain, palpitations and leg swelling.  Skin: Negative.   Psychiatric/Behavioral: Negative.     Per HPI unless specifically indicated above     Objective:    BP (!) 168/70   Pulse 87   Temp 98.4 F (36.9 C) (Oral)   Ht 6' (1.829 m)   Wt 115 lb (52.2 kg)   SpO2 95%   BMI 15.60 kg/m   Wt Readings from Last 3 Encounters:  10/30/18 115 lb (52.2 kg)  06/19/18 119 lb (54 kg)  04/26/18 118 lb (53.5 kg)    Physical Exam Vitals signs and nursing note reviewed.  Constitutional:      General: He is awake. He is not in acute distress.    Appearance: He is well-developed and  underweight. He is not ill-appearing.  HENT:     Head: Normocephalic and atraumatic.     Right Ear: Hearing, tympanic membrane, ear canal and external ear normal. No drainage.     Left Ear: Hearing, ear canal and external ear normal. No drainage. A middle ear effusion is present. Tympanic membrane is not injected.     Mouth/Throat:     Pharynx: Uvula midline.  Eyes:     General: Lids are normal.        Right eye: No discharge.        Left eye: No discharge.     Conjunctiva/sclera: Conjunctivae normal.     Pupils: Pupils are equal, round, and reactive to light.  Neck:     Musculoskeletal: Normal range of motion and neck supple.     Thyroid: No thyromegaly.     Vascular: No carotid bruit or JVD.     Trachea: Trachea normal.  Cardiovascular:     Rate and Rhythm: Normal rate and regular rhythm.     Heart sounds: Normal heart sounds, S1 normal and S2 normal. No murmur. No gallop.   Pulmonary:     Effort: Pulmonary effort is normal. No accessory muscle usage or respiratory distress.     Breath sounds: Decreased breath sounds present.  Comments: Diminished breath sounds throughout.  O2 dependent with O2 in place. Abdominal:     General: Bowel sounds are normal.     Palpations: Abdomen is soft.  Musculoskeletal: Normal range of motion.     Right lower leg: No edema.     Left lower leg: No edema.  Skin:    General: Skin is warm and dry.  Neurological:     Mental Status: He is alert and oriented to person, place, and time.     Deep Tendon Reflexes: Reflexes are normal and symmetric.  Psychiatric:        Attention and Perception: Attention normal.        Mood and Affect: Mood normal.        Speech: Speech normal.        Behavior: Behavior normal. Behavior is cooperative.        Thought Content: Thought content normal.        Judgment: Judgment normal.     Results for orders placed or performed in visit on 46/50/35  Basic metabolic panel  Result Value Ref Range   Glucose 97 65  - 99 mg/dL   BUN 12 8 - 27 mg/dL   Creatinine, Ser 0.59 (L) 0.76 - 1.27 mg/dL   GFR calc non Af Amer 98 >59 mL/min/1.73   GFR calc Af Amer 114 >59 mL/min/1.73   BUN/Creatinine Ratio 20 10 - 24   Sodium 141 134 - 144 mmol/L   Potassium 3.9 3.5 - 5.2 mmol/L   Chloride 94 (L) 96 - 106 mmol/L   CO2 35 (H) 20 - 29 mmol/L   Calcium 9.4 8.6 - 10.2 mg/dL  CBC with Differential/Platelet  Result Value Ref Range   WBC 7.9 3.4 - 10.8 x10E3/uL   RBC 3.96 (L) 4.14 - 5.80 x10E6/uL   Hemoglobin 13.2 13.0 - 17.7 g/dL   Hematocrit 38.9 37.5 - 51.0 %   MCV 98 (H) 79 - 97 fL   MCH 33.3 (H) 26.6 - 33.0 pg   MCHC 33.9 31.5 - 35.7 g/dL   RDW 13.2 11.6 - 15.4 %   Platelets 110 (L) 150 - 450 x10E3/uL   Neutrophils 69 Not Estab. %   Lymphs 21 Not Estab. %   Monocytes 9 Not Estab. %   Eos 1 Not Estab. %   Basos 0 Not Estab. %   Neutrophils Absolute 5.4 1.4 - 7.0 x10E3/uL   Lymphocytes Absolute 1.7 0.7 - 3.1 x10E3/uL   Monocytes Absolute 0.7 0.1 - 0.9 x10E3/uL   EOS (ABSOLUTE) 0.1 0.0 - 0.4 x10E3/uL   Basophils Absolute 0.0 0.0 - 0.2 x10E3/uL   Immature Granulocytes 0 Not Estab. %   Immature Grans (Abs) 0.0 0.0 - 0.1 x10E3/uL   Hematology Comments: Note:       Assessment & Plan:   Problem List Items Addressed This Visit      Nervous and Auditory   Eustachian tube dysfunction, left    Acute.  Script for short burst, low dose Prednisone sent.  Recommend not to clean ears with hair pin.  For nasal drainage continue to use Flonase daily.  Return for worsening or continued issues.          Follow up plan: Return if symptoms worsen or fail to improve.

## 2018-10-30 NOTE — Assessment & Plan Note (Signed)
Acute.  Script for short burst, low dose Prednisone sent.  Recommend not to clean ears with hair pin.  For nasal drainage continue to use Flonase daily.  Return for worsening or continued issues.

## 2018-10-30 NOTE — Patient Instructions (Signed)

## 2018-11-02 ENCOUNTER — Other Ambulatory Visit: Payer: Self-pay | Admitting: Family Medicine

## 2018-11-02 NOTE — Telephone Encounter (Signed)
Requested Prescriptions  Pending Prescriptions Disp Refills  . tamsulosin (FLOMAX) 0.4 MG CAPS capsule [Pharmacy Med Name: TAMSULOSIN HCL 0.4 MG CAPSULE] 90 capsule 0    Sig: TAKE 1 CAPSULE BY MOUTH EVERY DAY     Urology: Alpha-Adrenergic Blocker Failed - 11/02/2018  9:59 AM      Failed - Last BP in normal range    BP Readings from Last 1 Encounters:  10/30/18 (!) 168/70         Passed - Valid encounter within last 12 months    Recent Outpatient Visits          3 days ago Eustachian tube dysfunction, left   [employer], Fitzgerald T, NP   1 month ago Pure hypercholesterolemia   [place], Jeannette How, MD   2 months ago Acute ischemic stroke [facility])   [facility] Crissman, Jeannette How, MD   4 months ago Lower GI bleed   [place], Jeannette How, MD   7 months ago Essential hypertension   [place], Jeannette How, MD      Future Appointments            In 1 month [facility], Jeannette How, MD [facility], [facility]

## 2018-11-26 ENCOUNTER — Other Ambulatory Visit: Payer: Self-pay | Admitting: Family Medicine

## 2018-11-28 DIAGNOSIS — J9621 Acute and chronic respiratory failure with hypoxia: Secondary | ICD-10-CM | POA: Diagnosis not present

## 2018-11-28 DIAGNOSIS — I110 Hypertensive heart disease with heart failure: Secondary | ICD-10-CM | POA: Diagnosis not present

## 2018-11-28 DIAGNOSIS — R262 Difficulty in walking, not elsewhere classified: Secondary | ICD-10-CM | POA: Diagnosis not present

## 2018-11-28 DIAGNOSIS — N400 Enlarged prostate without lower urinary tract symptoms: Secondary | ICD-10-CM | POA: Diagnosis not present

## 2018-11-28 DIAGNOSIS — Z1159 Encounter for screening for other viral diseases: Secondary | ICD-10-CM | POA: Diagnosis not present

## 2018-11-28 DIAGNOSIS — I499 Cardiac arrhythmia, unspecified: Secondary | ICD-10-CM | POA: Diagnosis not present

## 2018-11-28 DIAGNOSIS — I348 Other nonrheumatic mitral valve disorders: Secondary | ICD-10-CM | POA: Diagnosis not present

## 2018-11-28 DIAGNOSIS — Z20828 Contact with and (suspected) exposure to other viral communicable diseases: Secondary | ICD-10-CM | POA: Diagnosis not present

## 2018-11-28 DIAGNOSIS — R918 Other nonspecific abnormal finding of lung field: Secondary | ICD-10-CM | POA: Diagnosis not present

## 2018-11-28 DIAGNOSIS — J449 Chronic obstructive pulmonary disease, unspecified: Secondary | ICD-10-CM | POA: Diagnosis not present

## 2018-11-28 DIAGNOSIS — I350 Nonrheumatic aortic (valve) stenosis: Secondary | ICD-10-CM | POA: Diagnosis not present

## 2018-11-28 DIAGNOSIS — I5032 Chronic diastolic (congestive) heart failure: Secondary | ICD-10-CM | POA: Diagnosis not present

## 2018-11-28 DIAGNOSIS — Z452 Encounter for adjustment and management of vascular access device: Secondary | ICD-10-CM | POA: Diagnosis not present

## 2018-11-28 DIAGNOSIS — J9601 Acute respiratory failure with hypoxia: Secondary | ICD-10-CM | POA: Diagnosis not present

## 2018-11-28 DIAGNOSIS — J441 Chronic obstructive pulmonary disease with (acute) exacerbation: Secondary | ICD-10-CM | POA: Diagnosis not present

## 2018-11-28 DIAGNOSIS — I5030 Unspecified diastolic (congestive) heart failure: Secondary | ICD-10-CM | POA: Diagnosis not present

## 2018-11-28 DIAGNOSIS — I872 Venous insufficiency (chronic) (peripheral): Secondary | ICD-10-CM | POA: Diagnosis not present

## 2018-11-28 DIAGNOSIS — Z681 Body mass index (BMI) 19 or less, adult: Secondary | ICD-10-CM | POA: Diagnosis not present

## 2018-11-28 DIAGNOSIS — I5033 Acute on chronic diastolic (congestive) heart failure: Secondary | ICD-10-CM | POA: Diagnosis not present

## 2018-11-28 DIAGNOSIS — Z4682 Encounter for fitting and adjustment of non-vascular catheter: Secondary | ICD-10-CM | POA: Diagnosis not present

## 2018-11-28 DIAGNOSIS — R9431 Abnormal electrocardiogram [ECG] [EKG]: Secondary | ICD-10-CM | POA: Diagnosis not present

## 2018-11-28 DIAGNOSIS — R2689 Other abnormalities of gait and mobility: Secondary | ICD-10-CM | POA: Diagnosis not present

## 2018-11-28 DIAGNOSIS — Z9911 Dependence on respirator [ventilator] status: Secondary | ICD-10-CM | POA: Diagnosis not present

## 2018-11-28 DIAGNOSIS — J9602 Acute respiratory failure with hypercapnia: Secondary | ICD-10-CM | POA: Diagnosis not present

## 2018-11-28 DIAGNOSIS — R64 Cachexia: Secondary | ICD-10-CM | POA: Diagnosis not present

## 2018-11-28 DIAGNOSIS — I517 Cardiomegaly: Secondary | ICD-10-CM | POA: Diagnosis not present

## 2018-11-28 DIAGNOSIS — E871 Hypo-osmolality and hyponatremia: Secondary | ICD-10-CM | POA: Diagnosis not present

## 2018-11-28 DIAGNOSIS — R931 Abnormal findings on diagnostic imaging of heart and coronary circulation: Secondary | ICD-10-CM | POA: Diagnosis not present

## 2018-11-28 DIAGNOSIS — R0602 Shortness of breath: Secondary | ICD-10-CM | POA: Diagnosis not present

## 2018-11-28 DIAGNOSIS — J9622 Acute and chronic respiratory failure with hypercapnia: Secondary | ICD-10-CM | POA: Diagnosis not present

## 2018-11-28 DIAGNOSIS — D696 Thrombocytopenia, unspecified: Secondary | ICD-10-CM | POA: Diagnosis not present

## 2018-11-28 DIAGNOSIS — R251 Tremor, unspecified: Secondary | ICD-10-CM | POA: Diagnosis not present

## 2018-12-02 ENCOUNTER — Ambulatory Visit: Payer: Medicare HMO | Admitting: Family Medicine

## 2018-12-03 MED ORDER — ASPIRIN 81 MG PO CHEW
81.00 | CHEWABLE_TABLET | ORAL | Status: DC
Start: 2018-12-03 — End: 2018-12-03

## 2018-12-03 MED ORDER — PREDNISONE 20 MG PO TABS
40.00 | ORAL_TABLET | ORAL | Status: DC
Start: 2018-12-03 — End: 2018-12-03

## 2018-12-03 MED ORDER — ESOMEPRAZOLE MAGNESIUM 40 MG PO PACK
40.00 | PACK | ORAL | Status: DC
Start: 2018-12-03 — End: 2018-12-03

## 2018-12-03 MED ORDER — ENOXAPARIN SODIUM 40 MG/0.4ML ~~LOC~~ SOLN
40.00 | SUBCUTANEOUS | Status: DC
Start: 2018-12-03 — End: 2018-12-03

## 2018-12-03 MED ORDER — GENERIC EXTERNAL MEDICATION
2.00 | Status: DC
Start: ? — End: 2018-12-03

## 2018-12-03 MED ORDER — NICOTINE 21 MG/24HR TD PT24
1.00 | MEDICATED_PATCH | TRANSDERMAL | Status: DC
Start: 2018-12-03 — End: 2018-12-03

## 2018-12-03 MED ORDER — MELATONIN 3 MG PO TABS
3.00 | ORAL_TABLET | ORAL | Status: DC
Start: 2018-12-02 — End: 2018-12-03

## 2018-12-03 MED ORDER — OLANZAPINE-FLUOXETINE HCL 6-50 MG PO CAPS
3.00 | ORAL_CAPSULE | ORAL | Status: DC
Start: ? — End: 2018-12-03

## 2018-12-03 MED ORDER — ACETAMINOPHEN 325 MG PO TABS
650.00 | ORAL_TABLET | ORAL | Status: DC
Start: ? — End: 2018-12-03

## 2018-12-03 MED ORDER — ATORVASTATIN CALCIUM 10 MG PO TABS
20.00 | ORAL_TABLET | ORAL | Status: DC
Start: 2018-12-03 — End: 2018-12-03

## 2018-12-03 MED ORDER — CHLORHEXIDINE GLUCONATE 0.12 % MT SOLN
5.00 | OROMUCOSAL | Status: DC
Start: 2018-12-02 — End: 2018-12-03

## 2018-12-03 MED ORDER — GUAIFENESIN 100 MG/5ML PO LIQD
200.00 | ORAL | Status: DC
Start: ? — End: 2018-12-03

## 2018-12-03 MED ORDER — IPRATROPIUM-ALBUTEROL 0.5-2.5 (3) MG/3ML IN SOLN
3.00 | RESPIRATORY_TRACT | Status: DC
Start: 2018-12-02 — End: 2018-12-03

## 2018-12-03 MED ORDER — GENERIC EXTERNAL MEDICATION
Status: DC
Start: ? — End: 2018-12-03

## 2018-12-03 MED ORDER — TAMSULOSIN HCL 0.4 MG PO CAPS
0.40 | ORAL_CAPSULE | ORAL | Status: DC
Start: 2018-12-02 — End: 2018-12-03

## 2018-12-03 MED ORDER — POLYETHYLENE GLYCOL 3350 17 G PO PACK
17.00 | PACK | ORAL | Status: DC
Start: ? — End: 2018-12-03

## 2018-12-04 ENCOUNTER — Telehealth: Payer: Self-pay | Admitting: Oncology

## 2018-12-04 NOTE — Telephone Encounter (Signed)
PT said he no longer needed care from our facility 12/04/2018-ltg

## 2018-12-05 ENCOUNTER — Telehealth: Payer: Self-pay

## 2018-12-05 NOTE — Telephone Encounter (Signed)
Transition Care Management Follow-up Telephone Call  Date of discharge and from where: 12/02/2018 from [facility]  How have you been since you were released from the hospital? "I am doing better"  Any questions or concerns? No   Items Reviewed:  Did the pt receive and understand the discharge instructions provided? Yes   Medications obtained and verified? Yes   Any new allergies since your discharge? No   Dietary orders reviewed? Yes  Do you have support at home? Yes   Functional Questionnaire: (I = Independent and D = Dependent) ADLs: i  Bathing/Dressing- d  Meal Prep- i  Eating- i  Maintaining continence- i  Transferring/Ambulation- d  Managing Meds- i  Follow up appointments reviewed:   PCP Hospital f/u appt confirmed? yes Scheduled to see Dr.Johnson on 12/06/2018 @ 2:30pm.  [facility] f/u appt confirmed? Yes    Are transportation arrangements needed? No   If their condition worsens, is the pt aware to call PCP or go to the Emergency Dept.? Yes  Was the patient provided with contact information for the PCP's office or ED? Yes  Was to pt encouraged to call back with questions or concerns? Yes

## 2018-12-06 ENCOUNTER — Ambulatory Visit (INDEPENDENT_AMBULATORY_CARE_PROVIDER_SITE_OTHER): Payer: Medicare HMO | Admitting: Family Medicine

## 2018-12-06 ENCOUNTER — Encounter: Payer: Self-pay | Admitting: Family Medicine

## 2018-12-06 ENCOUNTER — Other Ambulatory Visit: Payer: Self-pay

## 2018-12-06 VITALS — BP 152/73 | HR 99 | Temp 99.1°F

## 2018-12-06 DIAGNOSIS — E871 Hypo-osmolality and hyponatremia: Secondary | ICD-10-CM

## 2018-12-06 DIAGNOSIS — R251 Tremor, unspecified: Secondary | ICD-10-CM

## 2018-12-06 DIAGNOSIS — Z8719 Personal history of other diseases of the digestive system: Secondary | ICD-10-CM

## 2018-12-06 DIAGNOSIS — N400 Enlarged prostate without lower urinary tract symptoms: Secondary | ICD-10-CM | POA: Diagnosis not present

## 2018-12-06 DIAGNOSIS — I700 Atherosclerosis of aorta: Secondary | ICD-10-CM

## 2018-12-06 DIAGNOSIS — I2781 Cor pulmonale (chronic): Secondary | ICD-10-CM | POA: Diagnosis not present

## 2018-12-06 DIAGNOSIS — J9621 Acute and chronic respiratory failure with hypoxia: Secondary | ICD-10-CM | POA: Diagnosis not present

## 2018-12-06 DIAGNOSIS — R64 Cachexia: Secondary | ICD-10-CM | POA: Diagnosis not present

## 2018-12-06 DIAGNOSIS — R579 Shock, unspecified: Secondary | ICD-10-CM

## 2018-12-06 DIAGNOSIS — J441 Chronic obstructive pulmonary disease with (acute) exacerbation: Secondary | ICD-10-CM | POA: Diagnosis not present

## 2018-12-06 DIAGNOSIS — J9622 Acute and chronic respiratory failure with hypercapnia: Secondary | ICD-10-CM | POA: Diagnosis not present

## 2018-12-06 DIAGNOSIS — E782 Mixed hyperlipidemia: Secondary | ICD-10-CM | POA: Diagnosis not present

## 2018-12-06 DIAGNOSIS — E43 Unspecified severe protein-calorie malnutrition: Secondary | ICD-10-CM

## 2018-12-06 DIAGNOSIS — I350 Nonrheumatic aortic (valve) stenosis: Secondary | ICD-10-CM

## 2018-12-06 DIAGNOSIS — E785 Hyperlipidemia, unspecified: Secondary | ICD-10-CM

## 2018-12-06 MED ORDER — ALBUTEROL SULFATE HFA 108 (90 BASE) MCG/ACT IN AERS: 1.0000 | INHALATION_SPRAY | Freq: Four times a day (QID) | RESPIRATORY_TRACT | 6 refills | Status: DC | PRN

## 2018-12-06 MED ORDER — BUDESONIDE-FORMOTEROL FUMARATE 160-4.5 MCG/ACT IN AERO: 2.0000 | INHALATION_SPRAY | Freq: Every morning | RESPIRATORY_TRACT | 3 refills | Status: DC

## 2018-12-06 MED ORDER — SPIRIVA HANDIHALER 18 MCG IN CAPS: 18.0000 ug | ORAL_CAPSULE | Freq: Every day | RESPIRATORY_TRACT | 12 refills | Status: AC

## 2018-12-06 NOTE — Assessment & Plan Note (Signed)
Under good control on current regimen. Continue current regimen. Continue to monitor. Call with any concerns.

## 2018-12-06 NOTE — Assessment & Plan Note (Signed)
Getting worse- cardiology working him up for amyloidosis. To see cardiology on 12/18/18. Call with any concerns.

## 2018-12-06 NOTE — Assessment & Plan Note (Signed)
Labs drawn today. Await results. Continue to monitor.

## 2018-12-06 NOTE — Assessment & Plan Note (Addendum)
Getting worse- cardiology working him up for amyloidosis. To see cardiology on 12/18/18. Call with any concerns.

## 2018-12-06 NOTE — Assessment & Plan Note (Signed)
Continue ensure. Call with any concerns.

## 2018-12-06 NOTE — Assessment & Plan Note (Signed)
Checking labs today. Await results.  

## 2018-12-06 NOTE — Assessment & Plan Note (Signed)
Lungs clear, but decreased breath sounds. Will continue inhalers- refills given today. Call with any concerns.

## 2018-12-06 NOTE — Progress Notes (Signed)
BP (!) 152/73   Pulse 99   Temp 99.1 F (37.3 C)   SpO2 99%    Subjective:    Patient ID: Patrick Ayala, male    DOB: January 03, 1941, 78 y.o.   MRN: [id number]  HPI: Patrick Ayala is a 78 y.o. male  Chief Complaint  Patient presents with  . Hospitalization Follow-up   Transition of Care Hospital Follow up.   Hospital/Facility: [facility] D/C Physician: Dr. Faythe Casa D/C Date: 12/02/18  Records Requested: 12/06/18 Records Received: 12/06/18 Records Reviewed: 12/06/18  Diagnoses on Discharge: Acute Respiratory Failure Acute on chronic respiratory failure with hypoxia and hypercapnia (CMS-HCC) Active Problems: Essential hypertension Pulmonary hypertension (CMS-HCC) Thrombocytopenia (CMS-HCC) Severe aortic stenosis COPD (chronic obstructive pulmonary disease) (CMS-HCC) Cachexia (CMS-HCC) Chronic respiratory failure with hypoxia and hypercapnia (CMS-HCC) Old age Chronic diastolic heart failure (CMS-HCC) Resolved Problems: COPD exacerbation (CMS-HCC) Hyponatremia Shock (CMS-HCC)  Outpatient Provider Follow Up Issues:  - 1 more dose of prednisone for COPD exacerbation - f/u SPEP, UPEP, immunofixation for amyloidosis w/u - f/u with Structural Cardiology for severe aortic stenosis on 12/18/18 (including CT TAVR)   Date of interactive Contact within 48 hours of discharge: 12/05/18 Contact was through: phone  Date of 7 day or 14 day face-to-face visit:  12/06/18   within 7 days  Outpatient Encounter Medications as of 12/06/2018  Medication Sig Note  . aspirin 81 MG chewable tablet Chew by mouth.   Marland Kitchen acetaminophen (TYLENOL) 325 MG tablet Take 2 tablets (650 mg total) by mouth every 6 (six) hours as needed for mild pain (or Fever >/= 101). 06/19/2018: PRN  . albuterol (VENTOLIN HFA) 108 (90 Base) MCG/ACT inhaler Inhale 1-2 puffs into the lungs every 6 (six) hours as needed for wheezing or shortness of breath.   Marland Kitchen amLODipine (NORVASC) 5 MG tablet TAKE 1 TABLET (5 MG TOTAL) BY MOUTH  DAILY.   Marland Kitchen atorvastatin (LIPITOR) 20 MG tablet TAKE 1 TABLET EVERY DAY FOR HYPERCHOLESTEROLEMIA   . budesonide-formoterol (SYMBICORT) 160-4.5 MCG/ACT inhaler Inhale 2 puffs into the lungs every morning. INHALE 2 TIMES DAILY   . feeding supplement, ENSURE ENLIVE, (ENSURE ENLIVE) LIQD Take 237 mLs by mouth 3 (three) times daily between meals. (Patient not taking: Reported on 10/30/2018)   . fluticasone (FLONASE) 50 MCG/ACT nasal spray    . furosemide (LASIX) 20 MG tablet TAKE 1 TABLET EVERY TUESDAY AND THURSDAY IN THE MORNING   . metoprolol succinate (TOPROL-XL) 25 MG 24 hr tablet TAKE 1 TABLET BY MOUTH EVERY DAY   . Multiple Vitamin (MULTIVITAMIN WITH MINERALS) TABS tablet Take 1 tablet by mouth daily.   Marland Kitchen Spacer/Aero-Holding Chambers (AEROCHAMBER W/FLOWSIGNAL) inhaler Inhale into the lungs.   . SPIRIVA HANDIHALER 18 MCG inhalation capsule Place 1 capsule (18 mcg total) into inhaler and inhale daily.   . tamsulosin (FLOMAX) 0.4 MG CAPS capsule TAKE 1 CAPSULE BY MOUTH EVERY DAY   . vitamin C (ASCORBIC ACID) 250 MG tablet Take 250 mg by mouth 2 (two) times daily.   . [DISCONTINUED] albuterol (PROVENTIL HFA;VENTOLIN HFA) 108 (90 Base) MCG/ACT inhaler Inhale into the lungs.   . [DISCONTINUED] budesonide-formoterol (SYMBICORT) 160-4.5 MCG/ACT inhaler Inhale 2 puffs into the lungs every morning. INHALE 2 TIMES DAILY   . [DISCONTINUED] omeprazole (PRILOSEC) 20 MG capsule    . [DISCONTINUED] pantoprazole (PROTONIX) 40 MG tablet Take 1 tablet (40 mg total) by mouth daily.   . [DISCONTINUED] predniSONE (DELTASONE) 20 MG tablet    . [DISCONTINUED] SPIRIVA HANDIHALER 18 MCG inhalation capsule  No facility-administered encounter medications on file as of 12/06/2018.   Per Hospitalist: "Hospital Course:  Patrick Ayala is a 78 year old man with COPD (home 2-3L O2), pulmonary HTN, severe aortic stenosis, and HFpEF, and who presented to [facility] on 11/28/18 with 5-6 weeks of worsening shortness of breath, and was  found to have severe acute on chronic hypercarbic respiratory failure requiring intubation.  Acuteonchronic hypoxemic and hypercarbic respiratory failure 2/2COPD Per ED and geriatrics evaluation, patientdescribed5-6 weeks of worsening SOB. No increased oxygen requirement from baseline 2L [place]. No significant impairment of his ability to complete daily tasks during this period. On admission, VBG showedpCO2 >100, pH 7.26.Baseline pCO2 appears to be 70-90. COPD exacerbation is likely contributing to his worsening respiratory failure. Has been instructed to use BiPAP qHS but has had issues obtaining and using it in the past.He has not had any sick contacts, is afebrile, no sputum production, and not ill-appearing. Patient was also noted to have elevated pro-BNP to 4,130 (compared to 551 in 05/2018) downtrended significantly.CXR withhyperinflated lungs with bibasilar patchy opacitieslikely 2/2atelectasis. TTE without concern for R-sided heart strain.S/p IV methylpred x1, oral prednisone, and a few doses of ceftriaxone and azithromycin.  HFpEF,severeaortic stenosis Pro-BNP elevated to 4130, up from his baseline (above); was similarly elevated during a 05/2018 ED visit. POCUS showed mildly reduced LV function, no significant RV strain. ECHO 8/21: severe aortic stenosiswith low gradients, possible abnormal appearance of myocardium. Will follow up with structural cardiologists on 12/18/18.  Tremor, unknown etiology Patient reports feeling anxious and more agitated recently in the setting of a resting, rhythmic tremor of his hands/arms. Will continue to monitor and consider neuro consult if tremor persists.  BPH On tamsulosin."   Diagnostic Tests Reviewed:  EXAM: XR ABDOMEN 1 VIEW DATE: 11/29/2018 11:42 PM ACCESSION: [id number] UN DICTATED: 11/30/2018 7:50 AM INTERPRETATION LOCATION: [place]  CLINICAL INDICATION: 78 years old Male with line placement check ; OTHER   COMPARISON: The  current semiupright portable study is compared to that obtained earlier the same day at 12:18.  TECHNIQUE: Semiupright portable view of the abdomen.  FINDINGS:  Unweighted enteric tube with tip and side-port projecting over the fundus and body of the stomach. Right upper quadrant clips are again noted. Embolization coils are again seen projecting over the left upper quadrant of the abdomen. Gas-filled nondilated small and large bowel are visualized in the upper abdomen.  Tip of a right IJ line projects over the superior atriocaval junction. Hyperinflated lungs. Blunting of the costophrenic sulci. Heart is not enlarged.  Other Result Information  Interface, Rad Results In - 11/30/2018  7:53 AM EDT EXAM: XR ABDOMEN 1 VIEW DATE: 11/29/2018 11:42 PM ACCESSION: [id number] UN DICTATED: 11/30/2018 7:50 AM INTERPRETATION LOCATION: [place]  CLINICAL INDICATION: 78 years old Male with line placement check ; OTHER    COMPARISON: The current semiupright portable study is compared to that obtained earlier the same day at 12:18.  TECHNIQUE: Semiupright portable view of the abdomen.  FINDINGS:  Unweighted enteric tube with tip and side-port projecting over the fundus and body of the stomach. Right upper quadrant clips are again noted. Embolization coils are again seen projecting over the left upper quadrant of the abdomen. Gas-filled nondilated small and large bowel are visualized in the upper abdomen.  Tip of a right IJ line projects over the superior atriocaval junction. Hyperinflated lungs. Blunting of the costophrenic sulci. Heart is not enlarged.  IMPRESSION: Unweighted enteric tube in satisfactory position.   Technically difficult study due to chest wall/lung  interference  Left ventricle cavity is relatively small  Left ventricular hypertrophy - mild to moderate  Overall normal left ventricular systolic function, ejection fraction 36%  Diastolic dysfunction - grade II (elevated filling  pressures)  Degenerative mitral valve disease  Aortic stenosis - severe (low gradients likely in setting of small LV  cavity with decreased stroke volume)  Right ventricular hypertrophy - mild  Low normal right ventricular systolic function  Dilated right atrium - mild  Based on the appearance of the myocardium, RVH and aortic stenosis with  low gradient would consider evaluating for cardiac amyloidosis  EXAM: XR ABDOMEN 1 VIEW DATE: 11/29/2018 12:24 PM ACCESSION: [id number] UN DICTATED: 11/29/2018 1:13 PM INTERPRETATION LOCATION: [place]  CLINICAL INDICATION: 78 years old Male with NGT (CATHETER NON VASC FIT & ADJ)   COMPARISON: Same abdominal radiograph 08 48, same day chest radiograph  TECHNIQUE: Semi-upright view(s) of the abdomen.  FINDINGS:  LOWER THORAX: Clear lung bases.  ABDOMEN: Partially visualized nonobstructed loops of bowel. Gastric tube with distal and proximal ports overlying the stomach. Embolization coils overlying the mid left hemiabdomen.  BONE/TISSUES: No acute abnormalities or aggressive osseous lesions.  Other Result Information  Interface, Rad Results In - 11/29/2018  1:30 PM EDT EXAM: XR ABDOMEN 1 VIEW DATE: 11/29/2018 12:24 PM ACCESSION: [id number] UN DICTATED: 11/29/2018 1:13 PM INTERPRETATION LOCATION: [place]  CLINICAL INDICATION: 78 years old Male with NGT (CATHETER NON VASC FIT & ADJ)    COMPARISON: Same abdominal radiograph 08 48, same day chest radiograph  TECHNIQUE: Semi-upright view(s) of the abdomen.  FINDINGS:  LOWER THORAX: Clear lung bases.  ABDOMEN:  Partially visualized nonobstructed loops of bowel. Gastric tube with distal and proximal ports overlying the stomach. Embolization coils overlying the mid left hemiabdomen.  BONE/TISSUES: No acute abnormalities or aggressive osseous lesions.  IMPRESSION:  Gastric tube terminates in the expected location overlying the stomach.   Right IJ approach central venous  catheter with tip terminating at the distal SVC.  Endotracheal tube projects 6.3 cm from the carina.  Enteric tube side hole projects at the region of the GE junction. Recommend advancing 5 cm for optimum positioning.  Similar hyperinflated lungs with bibasilar patchy opacities likely representing atelectasis. Infection versus aspiration is also on the differential.  Addendum by Dr.Sakthivel MD:  Small bilateral pleural effusions.   Result Narrative  EXAM: XR CHEST 1 VIEW DATE:   ACCESSION: [id number] UN DICTATED: 11/29/2018 7:20 AM INTERPRETATION LOCATION: [place]  CLINICAL INDICATION: 78 years old Male with LINE CHECK (CATHETER VASCULAR FIT)   COMPARISON: 11/29/2018 chest radiograph.  TECHNIQUE: Portable Chest Radiograph.  FINDINGS:   Endotracheal tube projects approximately 5.4 cm from the carina. Right IJ approach central venous catheter with tip terminating at the distal SVC. Enteric tube projects over the region of the stomach. Side hole projects at the region of the GE junction.  Similar hyperinflated lungs with bibasilar patchy opacities likely representing atelectasis.  No pleural effusion or pneumothorax.  Stable cardiomediastinal silhouette.   Other Result Information  Interface, Rad Results In - 11/29/2018 10:01 AM EDT EXAM: XR CHEST 1 VIEW DATE:     ACCESSION: [id number] UN DICTATED: 11/29/2018 7:20 AM INTERPRETATION LOCATION: [place]  CLINICAL INDICATION: 78 years old Male with LINE CHECK (CATHETER VASCULAR FIT)    COMPARISON: 11/29/2018 chest radiograph.  TECHNIQUE: Portable Chest Radiograph.  FINDINGS:   Endotracheal tube projects approximately 5.4 cm from the carina. Right IJ approach central venous catheter with tip terminating at the distal SVC. Enteric tube projects over the  region of the stomach. Side hole projects at the region of the GE junction.  Similar hyperinflated lungs with bibasilar patchy opacities likely representing  atelectasis.  No pleural effusion or pneumothorax.  Stable cardiomediastinal silhouette.   IMPRESSION:  Right IJ approach central venous catheter with tip terminating at the distal SVC.  Endotracheal tube projects 6.3 cm from the carina.  Enteric tube side hole projects at the region of the GE junction. Recommend advancing 5 cm for optimum positioning.  Similar hyperinflated lungs with bibasilar patchy opacities likely representing atelectasis. Infection versus aspiration is also on the differential.  Addendum by Dr.Sakthivel MD:  Small bilateral pleural effusions.    Disposition: Home  Consults: Critical Care, cardiology  Discharge Instructions: You will also need to follow up with primary care provider (or pulmonologist) within a week, and a cardiologist within 2 weeks to discuss one of your heart valves (scheduled for 12/18/18 with Drs. Caranasos and Cavender).  Disease/illness Education:  Given today  Home Health/Community Services Discussions/Referrals:  N/A  Establishment or re-establishment of referral orders for community resources:  None  Discussion with other health care providers: None  Assessment and Support of treatment regimen adherence:  Good  Appointments Coordinated with: Patient  Education for self-management, independent living, and ADLs: Given today  Dmoni notes that today, he is feeling better. He is feeling better. Finished his prednisone. Still using his inhalers. Not feeling SOB. Still using oxygen. No other concerns or complaints at this time.   Relevant past medical, surgical, family and social history reviewed and updated as indicated. Interim medical history since our last visit reviewed. Allergies and medications reviewed and updated.  Review of Systems  Constitutional: Negative.   HENT: Negative.   Respiratory: Negative.   Cardiovascular: Negative.   Gastrointestinal: Negative.   Psychiatric/Behavioral: Negative.     Per HPI unless  specifically indicated above     Objective:    BP (!) 152/73   Pulse 99   Temp 99.1 F (37.3 C)   SpO2 99%   Wt Readings from Last 3 Encounters:  10/30/18 115 lb (52.2 kg)  06/19/18 119 lb (54 kg)  04/26/18 118 lb (53.5 kg)    Physical Exam Vitals signs and nursing note reviewed.  Constitutional:      General: He is not in acute distress.    Appearance: Normal appearance. He is cachectic. He is not ill-appearing, toxic-appearing or diaphoretic.  HENT:     Head: Normocephalic and atraumatic.     Right Ear: External ear normal.     Left Ear: External ear normal.     Nose: Nose normal.     Mouth/Throat:     Mouth: Mucous membranes are moist.     Pharynx: Oropharynx is clear.  Eyes:     General: No scleral icterus.       Right eye: No discharge.        Left eye: No discharge.     Extraocular Movements: Extraocular movements intact.     Conjunctiva/sclera: Conjunctivae normal.     Pupils: Pupils are equal, round, and reactive to light.  Neck:     Musculoskeletal: Normal range of motion and neck supple.  Cardiovascular:     Rate and Rhythm: Normal rate and regular rhythm.     Pulses: Normal pulses.     Heart sounds: Normal heart sounds. No murmur. No friction rub. No gallop.   Pulmonary:     Effort: Pulmonary effort is normal. No respiratory distress.  Breath sounds: No stridor. No wheezing, rhonchi or rales.     Comments: Decreased breath sound throughout Chest:     Chest wall: No tenderness.  Musculoskeletal: Normal range of motion.  Skin:    General: Skin is warm and dry.     Capillary Refill: Capillary refill takes less than 2 seconds.     Coloration: Skin is not jaundiced or pale.     Findings: No bruising, erythema, lesion or rash.  Neurological:     General: No focal deficit present.     Mental Status: He is alert and oriented to person, place, and time. Mental status is at baseline.  Psychiatric:        Mood and Affect: Mood normal.        Behavior:  Behavior normal.        Thought Content: Thought content normal.        Judgment: Judgment normal.     Results for orders placed or performed in visit on 70/26/37  Basic metabolic panel  Result Value Ref Range   Glucose 97 65 - 99 mg/dL   BUN 12 8 - 27 mg/dL   Creatinine, Ser 0.59 (L) 0.76 - 1.27 mg/dL   GFR calc non Af Amer 98 >59 mL/min/1.73   GFR calc Af Amer 114 >59 mL/min/1.73   BUN/Creatinine Ratio 20 10 - 24   Sodium 141 134 - 144 mmol/L   Potassium 3.9 3.5 - 5.2 mmol/L   Chloride 94 (L) 96 - 106 mmol/L   CO2 35 (H) 20 - 29 mmol/L   Calcium 9.4 8.6 - 10.2 mg/dL  CBC with Differential/Platelet  Result Value Ref Range   WBC 7.9 3.4 - 10.8 x10E3/uL   RBC 3.96 (L) 4.14 - 5.80 x10E6/uL   Hemoglobin 13.2 13.0 - 17.7 g/dL   Hematocrit 38.9 37.5 - 51.0 %   MCV 98 (H) 79 - 97 fL   MCH 33.3 (H) 26.6 - 33.0 pg   MCHC 33.9 31.5 - 35.7 g/dL   RDW 13.2 11.6 - 15.4 %   Platelets 110 (L) 150 - 450 x10E3/uL   Neutrophils 69 Not Estab. %   Lymphs 21 Not Estab. %   Monocytes 9 Not Estab. %   Eos 1 Not Estab. %   Basos 0 Not Estab. %   Neutrophils Absolute 5.4 1.4 - 7.0 x10E3/uL   Lymphocytes Absolute 1.7 0.7 - 3.1 x10E3/uL   Monocytes Absolute 0.7 0.1 - 0.9 x10E3/uL   EOS (ABSOLUTE) 0.1 0.0 - 0.4 x10E3/uL   Basophils Absolute 0.0 0.0 - 0.2 x10E3/uL   Immature Granulocytes 0 Not Estab. %   Immature Grans (Abs) 0.0 0.0 - 0.1 x10E3/uL   Hematology Comments: Note:       Assessment & Plan:   Problem List Items Addressed This Visit      Cardiovascular and Mediastinum   Atherosclerosis of aorta (HCC)    Will keep BP and cholesterol under good control. Continue to monitor.       Relevant Medications   aspirin 81 MG chewable tablet   Aortic stenosis, severe    Getting worse- cardiology working him up for amyloidosis. To see cardiology on 12/18/18. Call with any concerns.       Relevant Medications   aspirin 81 MG chewable tablet   Other Relevant Orders   CBC with  Differential/Platelet   Comprehensive metabolic panel   Cor pulmonale ([place])    Getting worse- cardiology working him up for amyloidosis. To see cardiology on  12/18/18. Call with any concerns.       Relevant Medications   aspirin 81 MG chewable tablet   Other Relevant Orders   CBC with Differential/Platelet   Comprehensive metabolic panel     Respiratory   COPD exacerbation (HCC)    Lungs clear, but decreased breath sounds. Will continue inhalers- refills given today. Call with any concerns.       Relevant Medications   budesonide-formoterol (SYMBICORT) 160-4.5 MCG/ACT inhaler   SPIRIVA HANDIHALER 18 MCG inhalation capsule   albuterol (VENTOLIN HFA) 108 (90 Base) MCG/ACT inhaler   Other Relevant Orders   CBC with Differential/Platelet   Comprehensive metabolic panel     Genitourinary   BPH (benign prostatic hyperplasia)    Under good control on current regimen. Continue current regimen. Continue to monitor. Call with any concerns.        Relevant Orders   CBC with Differential/Platelet   Comprehensive metabolic panel     Other   Hyperlipidemia    Labs drawn today. Await results. Continue to monitor.       Relevant Medications   aspirin 81 MG chewable tablet   Other Relevant Orders   CBC with Differential/Platelet   Comprehensive metabolic panel   Lipid Panel w/o Chol/HDL Ratio   Malnutrition ([place])    Checking labs today. Await results.       Relevant Orders   CBC with Differential/Platelet   Comprehensive metabolic panel   History of GI bleed    Checking labs today. Await results.       Relevant Orders   CBC with Differential/Platelet   Comprehensive metabolic panel   Tremor    Stable. Continue to monitor.       Relevant Orders   CBC with Differential/Platelet   Comprehensive metabolic panel   Cachexia ([place])    Continue ensure. Call with any concerns.       Relevant Orders   CBC with Differential/Platelet   Comprehensive metabolic panel    Other  Visit Diagnoses    Acute on chronic respiratory failure with hypoxia and hypercapnia ([place])    -  Primary   Resolved. Continue to monitor. Call with any concerns.    Relevant Orders   CBC with Differential/Platelet   Comprehensive metabolic panel   Hyponatremia       Will recheck labs today. Await results.    Relevant Orders   CBC with Differential/Platelet   Comprehensive metabolic panel   Shock ([place])       Resolved.    Relevant Orders   CBC with Differential/Platelet   Comprehensive metabolic panel       Follow up plan: Return As scheduled.

## 2018-12-06 NOTE — Assessment & Plan Note (Signed)
Will keep BP and cholesterol under good control. Continue to monitor.

## 2018-12-06 NOTE — Assessment & Plan Note (Signed)
Checking labs today. Await results.

## 2018-12-06 NOTE — Patient Instructions (Signed)
  Appointments which have been scheduled for you  Dec 18, 2018 2:00 PM (Arrive by 1:30 PM) CT CARDIAC TAVR with [facility] CT [facility] [place] [facility]) [place] [id number] [phone]  On appt date: Drink lots of water 24 hrs Bring recent lab work Take meds as usual Engineer, manufacturing systems of current meds Bring snack if diabetic  On appt date do not: Consume anything 2 hrs prior to your appointment  Let us know if pt: Allergic to contrast dyes Diabetic Pregnant or nursing Claustrophobic  (Title:[id number])  Dec 18, 2018 3:00 PM (Arrive by 2:30 PM) NEW GENERAL with Devra Dopp, MD [place] ([place]) [address] [place] [address] [phone]   Dec 18, 2018 3:30 PM (Arrive by 3:00 PM) NEW GENERAL with Adella Nissen, MD [place] ([place]) [address] [place] [address] [phone]

## 2018-12-06 NOTE — Assessment & Plan Note (Signed)
Stable. Continue to monitor.

## 2018-12-07 LAB — LIPID PANEL W/O CHOL/HDL RATIO
Cholesterol, Total: 142 mg/dL (ref 100–199)
HDL: 72 mg/dL (ref >39)
LDL Calculated: 52 mg/dL (ref 0–99)
Triglycerides: 91 mg/dL (ref 0–149)
VLDL Cholesterol Cal: 18 mg/dL (ref 5–40)

## 2018-12-07 LAB — COMPREHENSIVE METABOLIC PANEL
ALT: 22 IU/L (ref 0–44)
AST: 23 IU/L (ref 0–40)
Albumin/Globulin Ratio: 1.9 (ref 1.2–2.2)
Albumin: 4.3 g/dL (ref 3.7–4.7)
Alkaline Phosphatase: 54 IU/L (ref 39–117)
BUN/Creatinine Ratio: 16 (ref 10–24)
BUN: 16 mg/dL (ref 8–27)
Bilirubin Total: 0.5 mg/dL (ref 0.0–1.2)
CO2: 36 mmol/L — ABNORMAL HIGH (ref 20–29)
Calcium: 9.1 mg/dL (ref 8.6–10.2)
Chloride: 97 mmol/L (ref 96–106)
Creatinine, Ser: 0.97 mg/dL (ref 0.76–1.27)
GFR calc Af Amer: 87 mL/min/{1.73_m2} (ref >59)
GFR calc non Af Amer: 76 mL/min/{1.73_m2} (ref >59)
Globulin, Total: 2.3 g/dL (ref 1.5–4.5)
Glucose: 98 mg/dL (ref 65–99)
Potassium: 4.5 mmol/L (ref 3.5–5.2)
Sodium: 143 mmol/L (ref 134–144)
Total Protein: 6.6 g/dL (ref 6.0–8.5)

## 2018-12-07 LAB — CBC WITH DIFFERENTIAL/PLATELET
Basophils Absolute: 0.1 10*3/uL (ref 0.0–0.2)
Basos: 1 %
EOS (ABSOLUTE): 0.1 10*3/uL (ref 0.0–0.4)
Eos: 1 %
Hematocrit: 37.2 % — ABNORMAL LOW (ref 37.5–51.0)
Hemoglobin: 12.6 g/dL — ABNORMAL LOW (ref 13.0–17.7)
Immature Grans (Abs): 0 10*3/uL (ref 0.0–0.1)
Immature Granulocytes: 1 %
Lymphocytes Absolute: 1.8 10*3/uL (ref 0.7–3.1)
Lymphs: 21 %
MCH: 32 pg (ref 26.6–33.0)
MCHC: 33.9 g/dL (ref 31.5–35.7)
MCV: 94 fL (ref 79–97)
Monocytes Absolute: 0.8 10*3/uL (ref 0.1–0.9)
Monocytes: 9 %
Neutrophils Absolute: 6 10*3/uL (ref 1.4–7.0)
Neutrophils: 67 %
Platelets: 178 10*3/uL (ref 150–450)
RBC: 3.94 x10E6/uL — ABNORMAL LOW (ref 4.14–5.80)
RDW: 12.8 % (ref 11.6–15.4)
WBC: 8.8 10*3/uL (ref 3.4–10.8)

## 2018-12-09 ENCOUNTER — Encounter: Payer: Self-pay | Admitting: Family Medicine

## 2018-12-11 ENCOUNTER — Telehealth: Payer: Self-pay | Admitting: Family Medicine

## 2018-12-11 DIAGNOSIS — J9622 Acute and chronic respiratory failure with hypercapnia: Secondary | ICD-10-CM

## 2018-12-11 DIAGNOSIS — J9621 Acute and chronic respiratory failure with hypoxia: Secondary | ICD-10-CM

## 2018-12-11 NOTE — Telephone Encounter (Signed)
Copied from [place] [phone]. Topic: General - Other >> Dec 11, 2018  8:43 AM Leward Quan A wrote: Reason for CRM: Patient called to request orders from the doctor to go to [place] for him to receive a portable oxygen machine because the tanks that he have right now run out too fast. He states that it need to be a full flow machine. Patient can be reached at Ph# [phone]

## 2018-12-13 NOTE — Telephone Encounter (Signed)
Called to let pt know that we are looking into this for him.

## 2018-12-13 NOTE — Telephone Encounter (Signed)
Patient called to inquire about the orders that he requested for a portable oxygen concentrator. Patient is asking for a call back to discuss this with Dr Wynetta Emery or someone who can let him know what is going on. Ph# [phone]

## 2018-12-18 DIAGNOSIS — I745 Embolism and thrombosis of iliac artery: Secondary | ICD-10-CM | POA: Diagnosis not present

## 2018-12-18 DIAGNOSIS — I350 Nonrheumatic aortic (valve) stenosis: Secondary | ICD-10-CM | POA: Diagnosis not present

## 2018-12-18 DIAGNOSIS — Z87891 Personal history of nicotine dependence: Secondary | ICD-10-CM | POA: Diagnosis not present

## 2018-12-18 DIAGNOSIS — J439 Emphysema, unspecified: Secondary | ICD-10-CM | POA: Diagnosis not present

## 2018-12-18 DIAGNOSIS — I700 Atherosclerosis of aorta: Secondary | ICD-10-CM | POA: Diagnosis not present

## 2018-12-18 DIAGNOSIS — Z952 Presence of prosthetic heart valve: Secondary | ICD-10-CM | POA: Diagnosis not present

## 2018-12-18 DIAGNOSIS — J449 Chronic obstructive pulmonary disease, unspecified: Secondary | ICD-10-CM | POA: Diagnosis not present

## 2018-12-18 DIAGNOSIS — R911 Solitary pulmonary nodule: Secondary | ICD-10-CM | POA: Diagnosis not present

## 2018-12-18 DIAGNOSIS — I509 Heart failure, unspecified: Secondary | ICD-10-CM | POA: Diagnosis not present

## 2018-12-18 DIAGNOSIS — I10 Essential (primary) hypertension: Secondary | ICD-10-CM | POA: Diagnosis not present

## 2018-12-18 DIAGNOSIS — I517 Cardiomegaly: Secondary | ICD-10-CM | POA: Diagnosis not present

## 2018-12-23 ENCOUNTER — Other Ambulatory Visit: Payer: Self-pay

## 2018-12-23 ENCOUNTER — Ambulatory Visit (INDEPENDENT_AMBULATORY_CARE_PROVIDER_SITE_OTHER): Payer: Medicare HMO | Admitting: Family Medicine

## 2018-12-23 ENCOUNTER — Encounter: Payer: Self-pay | Admitting: Family Medicine

## 2018-12-23 DIAGNOSIS — I350 Nonrheumatic aortic (valve) stenosis: Secondary | ICD-10-CM | POA: Diagnosis not present

## 2018-12-23 DIAGNOSIS — J410 Simple chronic bronchitis: Secondary | ICD-10-CM

## 2018-12-23 DIAGNOSIS — I10 Essential (primary) hypertension: Secondary | ICD-10-CM

## 2018-12-23 MED ORDER — LISINOPRIL 20 MG PO TABS: 20.0000 mg | ORAL_TABLET | Freq: Every day | ORAL | 6 refills | Status: DC

## 2018-12-23 NOTE — Assessment & Plan Note (Signed)
Valve surgery coming up soon

## 2018-12-23 NOTE — Assessment & Plan Note (Addendum)
The current medical regimen is effective;  continue present plan and medications. Order home oxygen concentrator portable

## 2018-12-23 NOTE — Progress Notes (Signed)
There were no vitals taken for this visit.   Subjective:    Patient ID: Patrick Ayala, male    DOB: 10/28/1940, 78 y.o.   MRN: [id number]  HPI: Patrick Ayala is a 78 y.o. male  Med check Patient getting ready for valve surgery at [facility] blood pressures been elevated had not been taking amlodipine had amlodipine and lisinopril started patient reports his blood pressure is better and is taking his medications without side effects. Patient's oxygen has refillable tanks which run out quickly when patient is out and about wants concentrator will order. Breathing doing okay, heart failure stable, Reviewed [facility] notes  Relevant past medical, surgical, family and social history reviewed and updated as indicated. Interim medical history since our last visit reviewed. Allergies and medications reviewed and updated.  Review of Systems  Constitutional: Negative.   Respiratory: Negative.   Cardiovascular: Negative.     Per HPI unless specifically indicated above     Objective:    There were no vitals taken for this visit.  Wt Readings from Last 3 Encounters:  10/30/18 115 lb (52.2 kg)  06/19/18 119 lb (54 kg)  04/26/18 118 lb (53.5 kg)    Physical Exam  Results for orders placed or performed in visit on 12/06/18  CBC with Differential/Platelet  Result Value Ref Range   WBC 8.8 3.4 - 10.8 x10E3/uL   RBC 3.94 (L) 4.14 - 5.80 x10E6/uL   Hemoglobin 12.6 (L) 13.0 - 17.7 g/dL   Hematocrit 37.2 (L) 37.5 - 51.0 %   MCV 94 79 - 97 fL   MCH 32.0 26.6 - 33.0 pg   MCHC 33.9 31.5 - 35.7 g/dL   RDW 12.8 11.6 - 15.4 %   Platelets 178 150 - 450 x10E3/uL   Neutrophils 67 Not Estab. %   Lymphs 21 Not Estab. %   Monocytes 9 Not Estab. %   Eos 1 Not Estab. %   Basos 1 Not Estab. %   Neutrophils Absolute 6.0 1.4 - 7.0 x10E3/uL   Lymphocytes Absolute 1.8 0.7 - 3.1 x10E3/uL   Monocytes Absolute 0.8 0.1 - 0.9 x10E3/uL   EOS (ABSOLUTE) 0.1 0.0 - 0.4 x10E3/uL   Basophils Absolute 0.1 0.0 - 0.2  x10E3/uL   Immature Granulocytes 1 Not Estab. %   Immature Grans (Abs) 0.0 0.0 - 0.1 x10E3/uL  Comprehensive metabolic panel  Result Value Ref Range   Glucose 98 65 - 99 mg/dL   BUN 16 8 - 27 mg/dL   Creatinine, Ser 0.97 0.76 - 1.27 mg/dL   GFR calc non Af Amer 76 >59 mL/min/1.73   GFR calc Af Amer 87 >59 mL/min/1.73   BUN/Creatinine Ratio 16 10 - 24   Sodium 143 134 - 144 mmol/L   Potassium 4.5 3.5 - 5.2 mmol/L   Chloride 97 96 - 106 mmol/L   CO2 36 (H) 20 - 29 mmol/L   Calcium 9.1 8.6 - 10.2 mg/dL   Total Protein 6.6 6.0 - 8.5 g/dL   Albumin 4.3 3.7 - 4.7 g/dL   Globulin, Total 2.3 1.5 - 4.5 g/dL   Albumin/Globulin Ratio 1.9 1.2 - 2.2   Bilirubin Total 0.5 0.0 - 1.2 mg/dL   Alkaline Phosphatase 54 39 - 117 IU/L   AST 23 0 - 40 IU/L   ALT 22 0 - 44 IU/L  Lipid Panel w/o Chol/HDL Ratio  Result Value Ref Range   Cholesterol, Total 142 100 - 199 mg/dL   Triglycerides 91 0 - 149  mg/dL   HDL 72 >39 mg/dL   VLDL Cholesterol Cal 18 5 - 40 mg/dL   LDL Calculated 52 0 - 99 mg/dL      Assessment & Plan:   Problem List Items Addressed This Visit      Cardiovascular and Mediastinum   Essential hypertension    The current medical regimen is effective;  continue present plan and medications.       Relevant Medications   lisinopril (ZESTRIL) 20 MG tablet   Aortic stenosis, severe    Valve surgery coming up soon      Relevant Medications   lisinopril (ZESTRIL) 20 MG tablet     Respiratory   COPD (chronic obstructive pulmonary disease) (HCC)    The current medical regimen is effective;  continue present plan and medications. Order home oxygen concentrator portable          Follow up plan: Return if symptoms worsen or fail to improve, for pending surgery.

## 2018-12-23 NOTE — Assessment & Plan Note (Signed)
The current medical regimen is effective;  continue present plan and medications.  

## 2018-12-26 ENCOUNTER — Other Ambulatory Visit: Payer: Self-pay | Admitting: Family Medicine

## 2018-12-26 NOTE — Telephone Encounter (Signed)
Forwarding medication refill request to PCP for review.

## 2018-12-30 DIAGNOSIS — J449 Chronic obstructive pulmonary disease, unspecified: Secondary | ICD-10-CM | POA: Diagnosis not present

## 2018-12-30 DIAGNOSIS — J9622 Acute and chronic respiratory failure with hypercapnia: Secondary | ICD-10-CM | POA: Diagnosis not present

## 2019-01-06 ENCOUNTER — Telehealth: Payer: Self-pay | Admitting: Family Medicine

## 2019-01-06 NOTE — Telephone Encounter (Signed)
Dolores Lory calling from [facility] calling to request OV notes from 12/23/2018.  [phone]

## 2019-01-06 NOTE — Telephone Encounter (Signed)
Will fax office notes.

## 2019-01-09 ENCOUNTER — Telehealth: Payer: Self-pay

## 2019-01-09 DIAGNOSIS — Z7982 Long term (current) use of aspirin: Secondary | ICD-10-CM | POA: Diagnosis not present

## 2019-01-09 DIAGNOSIS — I350 Nonrheumatic aortic (valve) stenosis: Secondary | ICD-10-CM | POA: Diagnosis not present

## 2019-01-09 DIAGNOSIS — R0602 Shortness of breath: Secondary | ICD-10-CM | POA: Diagnosis not present

## 2019-01-09 DIAGNOSIS — I5032 Chronic diastolic (congestive) heart failure: Secondary | ICD-10-CM | POA: Diagnosis not present

## 2019-01-09 DIAGNOSIS — J449 Chronic obstructive pulmonary disease, unspecified: Secondary | ICD-10-CM | POA: Diagnosis not present

## 2019-01-09 DIAGNOSIS — N400 Enlarged prostate without lower urinary tract symptoms: Secondary | ICD-10-CM | POA: Diagnosis not present

## 2019-01-09 DIAGNOSIS — Z87891 Personal history of nicotine dependence: Secondary | ICD-10-CM | POA: Diagnosis not present

## 2019-01-09 DIAGNOSIS — I110 Hypertensive heart disease with heart failure: Secondary | ICD-10-CM | POA: Diagnosis not present

## 2019-01-09 DIAGNOSIS — Z7951 Long term (current) use of inhaled steroids: Secondary | ICD-10-CM | POA: Diagnosis not present

## 2019-01-09 DIAGNOSIS — I2510 Atherosclerotic heart disease of native coronary artery without angina pectoris: Secondary | ICD-10-CM | POA: Diagnosis not present

## 2019-01-09 NOTE — Telephone Encounter (Signed)
Dr. Jeananne Rama, please document further information in last progress note for patient's oxygen therapy.  At rest oxygen: (is 99% per progress note from August)  Exertion on Room Air:  Exertion on Oxygren:

## 2019-01-09 NOTE — Telephone Encounter (Signed)
This was not done and cant be done as pt does not have a pulse ox

## 2019-01-09 NOTE — Telephone Encounter (Signed)
Absolutely.  No problem.  I am assuming patient needs this for oxygen treatment at home.

## 2019-01-09 NOTE — Telephone Encounter (Signed)
LVM for patient to return phone call.

## 2019-01-09 NOTE — Telephone Encounter (Signed)
Jolene, you have seen this patient before in the past. Could I schedule an appointment with you for patient to come in for Pulse Ox readings?

## 2019-01-13 NOTE — Telephone Encounter (Signed)
[facility] stated they need an addendum regarding pt's oxygen. They need to know benefit and usage of oxygen, alternative treatments and a qualifying overnight oxygen test. Can be added to OV notes from 12/23/18 and faxed back. FAX [phone]

## 2019-01-13 NOTE — Telephone Encounter (Signed)
Patient scheduled for Wednesday 10/07 at 1:00

## 2019-01-14 NOTE — Telephone Encounter (Signed)
Patient has an appointment with [facility] tomorrow.  I have paper work with me.  Routing to provider to advise and FYI.

## 2019-01-14 NOTE — Telephone Encounter (Signed)
Noted.

## 2019-01-14 NOTE — Telephone Encounter (Signed)
Looks like we may need to get him back into pulmonary to assist with some of this, will discuss this with him tomorrow.  Looks like he previously saw them at [facility].

## 2019-01-15 ENCOUNTER — Ambulatory Visit: Payer: Self-pay | Admitting: *Deleted

## 2019-01-15 ENCOUNTER — Ambulatory Visit (INDEPENDENT_AMBULATORY_CARE_PROVIDER_SITE_OTHER): Payer: Medicare HMO | Admitting: Nurse Practitioner

## 2019-01-15 ENCOUNTER — Other Ambulatory Visit: Payer: Self-pay

## 2019-01-15 ENCOUNTER — Encounter: Payer: Self-pay | Admitting: Nurse Practitioner

## 2019-01-15 ENCOUNTER — Telehealth: Payer: Self-pay

## 2019-01-15 VITALS — BP 138/63 | HR 89 | Temp 98.7°F | Ht 71.0 in | Wt 113.0 lb

## 2019-01-15 DIAGNOSIS — I270 Primary pulmonary hypertension: Secondary | ICD-10-CM | POA: Diagnosis not present

## 2019-01-15 DIAGNOSIS — J9622 Acute and chronic respiratory failure with hypercapnia: Secondary | ICD-10-CM

## 2019-01-15 DIAGNOSIS — I2781 Cor pulmonale (chronic): Secondary | ICD-10-CM | POA: Diagnosis not present

## 2019-01-15 DIAGNOSIS — J449 Chronic obstructive pulmonary disease, unspecified: Secondary | ICD-10-CM

## 2019-01-15 DIAGNOSIS — I10 Essential (primary) hypertension: Secondary | ICD-10-CM

## 2019-01-15 DIAGNOSIS — J9621 Acute and chronic respiratory failure with hypoxia: Secondary | ICD-10-CM

## 2019-01-15 NOTE — Chronic Care Management (AMB) (Signed)
Chronic Care Management      `Initial Visit Note  01/15/2019 Name: Patrick Ayala MRN: [id number] DOB: 07-May-1940  Referred by: Patrick Maple, MD Reason for referral : No chief complaint on file.   Patrick Ayala is a 78 y.o. year old male who is a primary care patient of Patrick Ayala, Patrick How, MD. The CCM team was consulted for assistance with chronic disease management and care coordination needs related to HTN COPD  Review of patient status, including review of consultants reports, relevant laboratory and other test results, and collaboration with appropriate care team members and the patient's provider was performed as part of comprehensive patient evaluation and provision of chronic care management services.    SDOH (Social Determinants of Health) screening performed today: Advanced Directives(palliative referral placed) . See Care Plan for related entries.   Advanced Directives Status: N See Care Plan and Vynca application for related entries.   Medications: Outpatient Encounter Medications as of 01/15/2019  Medication Sig Note  . acetaminophen (TYLENOL) 325 MG tablet Take 2 tablets (650 mg total) by mouth every 6 (six) hours as needed for mild pain (or Fever >/= 101). 06/19/2018: PRN  . albuterol (VENTOLIN HFA) 108 (90 Base) MCG/ACT inhaler Inhale 1-2 puffs into the lungs every 6 (six) hours as needed for wheezing or shortness of breath.   Marland Kitchen amLODipine (NORVASC) 5 MG tablet TAKE 1 TABLET (5 MG TOTAL) BY MOUTH DAILY.   Marland Kitchen aspirin 81 MG chewable tablet Chew by mouth.   Marland Kitchen atorvastatin (LIPITOR) 20 MG tablet TAKE 1 TABLET EVERY DAY FOR HYPERCHOLESTEROLEMIA   . budesonide-formoterol (SYMBICORT) 160-4.5 MCG/ACT inhaler Inhale 2 puffs into the lungs every morning. INHALE 2 TIMES DAILY   . feeding supplement, ENSURE ENLIVE, (ENSURE ENLIVE) LIQD Take 237 mLs by mouth 3 (three) times daily between meals. (Patient not taking: Reported on 10/30/2018)   . fluticasone (FLONASE) 50 MCG/ACT nasal spray     . furosemide (LASIX) 20 MG tablet TAKE 1 TABLET EVERY TUESDAY AND THURSDAY IN THE MORNING   . lisinopril (ZESTRIL) 20 MG tablet Take 1 tablet (20 mg total) by mouth daily.   . metoprolol succinate (TOPROL-XL) 25 MG 24 hr tablet TAKE 1 TABLET BY MOUTH EVERY DAY   . Multiple Vitamin (MULTIVITAMIN WITH MINERALS) TABS tablet Take 1 tablet by mouth daily.   Marland Kitchen Spacer/Aero-Holding Chambers (AEROCHAMBER W/FLOWSIGNAL) inhaler Inhale into the lungs.   . SPIRIVA HANDIHALER 18 MCG inhalation capsule Place 1 capsule (18 mcg total) into inhaler and inhale daily.   . tamsulosin (FLOMAX) 0.4 MG CAPS capsule TAKE 1 CAPSULE BY MOUTH EVERY DAY   . vitamin C (ASCORBIC ACID) 250 MG tablet Take 250 mg by mouth 2 (two) times daily.    No facility-administered encounter medications on file as of 01/15/2019.      Objective:   Goals Addressed            This Visit's Progress   . I need a portable 02 concentrator and I would like palliative care (pt-stated)       Current Barriers:  . Chronic Disease Management support and education needs related to COPD and Heart Disease  . No Advanced Directives in place . Noted advanced COPD with need of Palliative support . Needs for re-qualifying for 02 and patient requests for a lighter portable 02 related to weakness.   Nurse Case Manager Clinical Goal(s):  Marland Kitchen Over the next 90 days, patient will work with Patrick Ayala  to address needs related to multiple  Chronic disease processes   Interventions:  Met with Patient, spouse and son in person at F/U MD appt.  . Evaluation of current treatment plan related to COPD and 02 needs and patient's adherence to plan as established by provider. Patrick Ayala with PCP regarding patient's needs for Palliative and 02 equipment . Discussed plans with patient for ongoing care management follow up and provided patient with direct contact information for care management team . Provided patient and/or caregiver with verbal  information about  Patrick Ayala (community resource). . Placed a call to Patrick Ayala spoke with Patrick Ayala. Palliative referral placed. Made Patrick Ayala aware scheduler needs to speak with patient's spouse Patrick Ayala and she works until [employer]. Patrick Ayala's number [phone] . Sent an email to Patrick Ayala with Patrick Ayala inquiring about the process of obtaining a portable 02 concentrator.   Patient Self Care Activities:  . Attends all scheduled provider appointments . Unable to perform IADLs independently related to weakness, does not drive.   Initial goal documentation         Patrick Ayala was given information about Chronic Care Management services today including:  1. CCM service includes personalized support from designated clinical staff supervised by his physician, including individualized plan of care and coordination with other care providers 2. 24/7 contact phone numbers for assistance for urgent and routine care needs. 3. Service will only be billed when office clinical staff spend 20 minutes or more in a month to coordinate care. 4. Only one practitioner may furnish and bill the service in a calendar month. 5. The patient may stop CCM services at any time (effective at the end of the month) by phone call to the office staff. 6. The patient will be responsible for cost sharing (co-pay) of up to 20% of the service fee (after annual deductible is met).  Patient agreed to services and verbal consent obtained.   Plan:   The care management team will reach out to the patient again over the next 30 days.  The patient has been provided with contact information for the care management team and has been advised to call with any health related questions or concerns.   Merlene Morse Cal Gindlesperger RN, BSN Nurse Case Editor, [facility]ing [facility]  [phone]) Business Mobile

## 2019-01-15 NOTE — Assessment & Plan Note (Signed)
Chronic, severe, dependent on continuous O2.  CCM referral placed for assistance in obtaining portable O2 and palliative services for patient.  Continue current medication regimen and collaboration with pulmonary.

## 2019-01-15 NOTE — Patient Instructions (Signed)
Palliative Care Palliative care involves care of body, mind, and spirit in order to improve a person's quality of life. Palliative care services are offered to people dealing with serious and life-threatening illnesses, often in the hospital or in a long-term care setting. The specific services are different for each person and are based on the person's needs and preferences. Palliative care requires a team of people who ensure:  Control of pain and other symptoms.  Family support.  Spiritual support.  Emotional and social support.  Comfort. Palliative care is a way to bring comfort and peace of mind to a person and his or her family. It can have a positive impact on the person's quality of life and the course of the illness. What is the difference between palliative care and hospice? Palliative care and hospice have similar goals of managing symptoms, promoting comfort, improving quality of life, and maintaining a person's dignity. However, palliative care may be offered during any phase of a serious illness, while hospice care is usually offered when a person is expected to live for 6 months or less. Who can receive palliative care services? Palliative care is offered to children and adults who are seriously ill. It is often offered in cases where a person:  Is not responding well to treatment.  Needs pain management.  Had treatment or surgery and developed symptoms that are difficult to manage.  Is undergoing a treatment to cure a condition (active treatment), like chemotherapy.  Has a diagnosis of an advanced disease, or a disease that shortens his or her life. A health care provider will usually recommend palliative care services when more support would be helpful. Family members and friends may also receive palliative care services to cope with stress and other concerns. Who makes up the palliative care team? The following people make up a palliative care team:  The person  receiving care and his or her family.  Physicians, including primary health care providers and specialists.  Nurses.  A Education officer, museum. Depending on a person's needs, the following people may also be included on a palliative care team:  A pain specialist.  A hospice specialist.  A financial or Research officer, trade union.  Religious or spiritual leaders.  A care coordinator or case manager.  A bereavement coordinator. The team will talk with the person and his or her family about:  The role of the pain specialist and the hospice specialist.  The person's physical symptoms, such as pain, nausea, vomiting, and shortness of breath.  Stress, depression, and anxiety symptoms.  Function and mobility issues and how to stay as active as possible.  Treatment options and how they may affect life.  Spiritual wishes, such as rituals and prayer.  Legacy and memory making activities.  Life and death as a normal process.  Advance directives or living wills, health care proxies, and end-of-life care.  Any other concerns or issues. The palliative care team will make it okay to talk about difficult issues and topics. They will address spiritual and emotional concerns and give the person's preferences high importance. Summary  Palliative care involves care of body, mind, and spirit in order to improve a person's quality of life.  The specific services are different for each person and are based on the person's needs and preferences.  Palliative care is a way to bring comfort and peace of mind to a person and his or her family. This information is not intended to replace advice given to you by your health care  provider. Make sure you discuss any questions you have with your health care provider. Document Released: 04/01/2013 Document Revised: 04/24/2017 Document Reviewed: 06/12/2016 Elsevier Patient Education  2020 [employer].

## 2019-01-15 NOTE — Progress Notes (Signed)
BP 138/63   Pulse 89   Temp 98.7 F (37.1 C) (Oral)   Ht _0  (1.803 m)   Wt 113 lb (51.3 kg)   SpO2 (!) 87%   BMI 15.76 kg/m    Subjective:    Patient ID: Patrick Ayala, male    DOB: Feb 02, 1941, 78 y.o.   MRN: [id number]  HPI: Patrick Ayala is a 77 y.o. male  Chief Complaint  Patient presents with  . Follow-up   COR PULMONALE & COPD Has been O2 dependent for a long time due to Cor Pulmonale/Pulmonary hypertension and severe COPD, over 3 years per patient.  Is dependent on O2 wears morning, noon, night.  For 2 minute walk with 2.5 L O2 started out 95% and then ended on 87%, started walk at 1:15 pm.  Refuses to check O2 sats without O2 on today.  O2 sat resting with 2.5 L O2 on was 95%. Patient will require ongoing continuous O2 use do to his severe COPD and Cor Pulmonale.  There are no alternative treatments at this point due to severity of his COPD and pulmonary hypertension.  He continues on inhalers daily and continuous O2.  At baseline is SOB walking from room to room in the house, does not need to stop to take breath, but does need to catch breath one enters new room Currently smokes about 1/2 PPD, sometimes more and sometimes less.  Last saw pulmonary at [facility] in March 2020.  Continues to report a good appetite, discussed use of protein supplements at home.  He has tried these in the past, but does not like taste.  Educated him on turning it into ice cream and placing sweet items in it to enhance taste, which would allow intake of protein.  [facility] had placed pulmonary rehab order to [facility] back in March 2020, but on review this was not started, suspect this was related to Covid 19 pandemic and rehab being shut down for short period. COPD status: controlled Satisfied with current treatment?: yes Oxygen use: yes Dyspnea frequency: intermittent, no increase Cough frequency: intermittent, no increase Rescue inhaler frequency:  Varies once to twice a day Limitation of activity: yes  Productive cough: none Last Spirometry: none Pneumovax: Up to Date Influenza: refuses   Relevant past medical, surgical, family and social history reviewed and updated as indicated. Interim medical history since our last visit reviewed. Allergies and medications reviewed and updated.  Review of Systems  Constitutional: Negative for activity change, diaphoresis, fatigue and fever.  Respiratory: Positive for cough (intermittent, baseline), shortness of breath (intermittent, baseline) and wheezing (intermittent, baseline). Negative for chest tightness.   Cardiovascular: Negative for chest pain, palpitations and leg swelling.  Gastrointestinal: Negative for abdominal distention, abdominal pain, constipation, diarrhea, nausea and vomiting.  Endocrine: Negative for cold intolerance, heat intolerance, polydipsia, polyphagia and polyuria.  Neurological: Negative for dizziness, syncope, weakness, light-headedness, numbness and headaches.  Psychiatric/Behavioral: Negative.     Per HPI unless specifically indicated above     Objective:    BP 138/63   Pulse 89   Temp 98.7 F (37.1 C) (Oral)   Ht _1  (1.803 m)   Wt 113 lb (51.3 kg)   SpO2 (!) 87%   BMI 15.76 kg/m   Wt Readings from Last 3 Encounters:  01/15/19 113 lb (51.3 kg)  10/30/18 115 lb (52.2 kg)  06/19/18 119 lb (54 kg)    Physical Exam Vitals signs and nursing note reviewed.  Constitutional:  General: He is awake. He is not in acute distress.    Appearance: He is well-developed. He is cachectic. He is ill-appearing.  HENT:     Head: Normocephalic and atraumatic.     Right Ear: Hearing normal. No drainage.     Left Ear: Hearing normal. No drainage.  Eyes:     General: Lids are normal.        Right eye: No discharge.        Left eye: No discharge.     Conjunctiva/sclera: Conjunctivae normal.     Pupils: Pupils are equal, round, and reactive to light.  Neck:     Musculoskeletal: Normal range of motion and neck  supple.     Vascular: No carotid bruit.  Cardiovascular:     Rate and Rhythm: Normal rate and regular rhythm.     Heart sounds: Normal heart sounds, S1 normal and S2 normal. No murmur. No gallop.   Pulmonary:     Effort: No accessory muscle usage or respiratory distress.     Breath sounds: Decreased breath sounds and wheezing present.     Comments: Decreased breath sounds throughout with intermittent expiratory wheezes.  Clubbing of nails bilateral hands. Abdominal:     General: Bowel sounds are normal.     Palpations: Abdomen is soft. There is no hepatomegaly or splenomegaly.  Musculoskeletal: Normal range of motion.     Right lower leg: No edema.     Left lower leg: No edema.  Skin:    General: Skin is warm and dry.     Capillary Refill: Capillary refill takes less than 2 seconds.     Findings: No rash.  Neurological:     Mental Status: He is alert and oriented to person, place, and time.     Deep Tendon Reflexes: Reflexes are normal and symmetric.  Psychiatric:        Mood and Affect: Mood normal.        Behavior: Behavior normal. Behavior is cooperative.        Thought Content: Thought content normal.        Judgment: Judgment normal.     Results for orders placed or performed in visit on 12/06/18  CBC with Differential/Platelet  Result Value Ref Range   WBC 8.8 3.4 - 10.8 x10E3/uL   RBC 3.94 (L) 4.14 - 5.80 x10E6/uL   Hemoglobin 12.6 (L) 13.0 - 17.7 g/dL   Hematocrit 37.2 (L) 37.5 - 51.0 %   MCV 94 79 - 97 fL   MCH 32.0 26.6 - 33.0 pg   MCHC 33.9 31.5 - 35.7 g/dL   RDW 12.8 11.6 - 15.4 %   Platelets 178 150 - 450 x10E3/uL   Neutrophils 67 Not Estab. %   Lymphs 21 Not Estab. %   Monocytes 9 Not Estab. %   Eos 1 Not Estab. %   Basos 1 Not Estab. %   Neutrophils Absolute 6.0 1.4 - 7.0 x10E3/uL   Lymphocytes Absolute 1.8 0.7 - 3.1 x10E3/uL   Monocytes Absolute 0.8 0.1 - 0.9 x10E3/uL   EOS (ABSOLUTE) 0.1 0.0 - 0.4 x10E3/uL   Basophils Absolute 0.1 0.0 - 0.2 x10E3/uL    Immature Granulocytes 1 Not Estab. %   Immature Grans (Abs) 0.0 0.0 - 0.1 x10E3/uL  Comprehensive metabolic panel  Result Value Ref Range   Glucose 98 65 - 99 mg/dL   BUN 16 8 - 27 mg/dL   Creatinine, Ser 0.97 0.76 - 1.27 mg/dL   GFR calc non  Af Amer 76 >59 mL/min/1.73   GFR calc Af Amer 87 >59 mL/min/1.73   BUN/Creatinine Ratio 16 10 - 24   Sodium 143 134 - 144 mmol/L   Potassium 4.5 3.5 - 5.2 mmol/L   Chloride 97 96 - 106 mmol/L   CO2 36 (H) 20 - 29 mmol/L   Calcium 9.1 8.6 - 10.2 mg/dL   Total Protein 6.6 6.0 - 8.5 g/dL   Albumin 4.3 3.7 - 4.7 g/dL   Globulin, Total 2.3 1.5 - 4.5 g/dL   Albumin/Globulin Ratio 1.9 1.2 - 2.2   Bilirubin Total 0.5 0.0 - 1.2 mg/dL   Alkaline Phosphatase 54 39 - 117 IU/L   AST 23 0 - 40 IU/L   ALT 22 0 - 44 IU/L  Lipid Panel w/o Chol/HDL Ratio  Result Value Ref Range   Cholesterol, Total 142 100 - 199 mg/dL   Triglycerides 91 0 - 149 mg/dL   HDL 72 >39 mg/dL   VLDL Cholesterol Cal 18 5 - 40 mg/dL   LDL Calculated 52 0 - 99 mg/dL      Assessment & Plan:   Problem List Items Addressed This Visit      Cardiovascular and Mediastinum   Pulmonary hypertension, primary (HCC)    Chronic, severe, dependent on continuous O2.  CCM referral placed for assistance in obtaining portable O2 and palliative services for patient.  Continue current medication regimen and collaboration with pulmonary.      Relevant Orders   Referral to Chronic Care Management Services   Cor pulmonale [facility])    Chronic with recent worsening.  Will get palliative services in home, which patient agreeable to after much discussion today.  Continue collaboration with cardiology team.  Return in 6 months or sooner if worsening.      Relevant Orders   Referral to Chronic Care Management Services     Respiratory   COPD, severe ([place]) - Primary    Chronic, severe, with continuous O2 dependence.  Continue current medication regimen and collaboration with pulmonary.  CCM referral  place for assistance with obtaining portable O2 and palliative services.  Will complete paperwork.      Relevant Orders   Referral to Chronic Care Management Services      Time: 40 minutes, >50% spent counseling on disease processes and educating on palliative services + performing O2 testing  Follow up plan: Return in about 6 months (around 07/16/2019) for COPD and Heart Disease.

## 2019-01-15 NOTE — Assessment & Plan Note (Signed)
Chronic, severe, with continuous O2 dependence.  Continue current medication regimen and collaboration with pulmonary.  CCM referral place for assistance with obtaining portable O2 and palliative services.  Will complete paperwork.

## 2019-01-15 NOTE — Chronic Care Management (AMB) (Signed)
  Chronic Care Management   Note  01/15/2019 Name: Patrick Ayala MRN: [id number] DOB: 1940-11-21  This encounter was opened erroneously.   Merlene Morse Kylene Zamarron RN, BSN Nurse Case Editor, commissioning Family Practice/[facility] Care Management  [phone]) Business Mobile

## 2019-01-15 NOTE — Assessment & Plan Note (Signed)
Chronic with recent worsening.  Will get palliative services in home, which patient agreeable to after much discussion today.  Continue collaboration with cardiology team.  Return in 6 months or sooner if worsening.

## 2019-01-16 NOTE — Patient Instructions (Signed)
Thank you allowing the Chronic Care Management Team to be a part of your care! It was a pleasure speaking with you today!   CCM (Chronic Care Management) Team   Deland Slocumb RN, BSN Nurse Care Coordinator  [phone]  Catie [facility] PharmD  Clinical Pharmacist  [phone]  Eula Fried LCSW Clinical Social Worker [phone]  Goals Addressed            This Visit's Progress   . I need a portable 02 concentrator and I would like palliative care (pt-stated)       Current Barriers:  . Chronic Disease Management support and education needs related to COPD and Heart Disease  . No Advanced Directives in place . Noted advanced COPD with need of Palliative support . Needs for re-qualifying for 02 and patient requests for a lighter portable 02 related to weakness.   Nurse Case Manager Clinical Goal(s):  Marland Kitchen Over the next 90 days, patient will work with [facility]  to address needs related to multiple Chronic disease processes   Interventions:  Met with Patient, spouse and son in person at F/U MD appt.  . Evaluation of current treatment plan related to COPD and 02 needs and patient's adherence to plan as established by provider. Nash Dimmer with PCP regarding patient's needs for Palliative and 02 equipment . Discussed plans with patient for ongoing care management follow up and provided patient with direct contact information for care management team . Provided patient and/or caregiver with verbal  information about [place] (community resource). . Placed a call to [place] spoke with [facility]. Palliative referral placed. Made Denise aware scheduler needs to speak with patient's spouse [name] and she works until [employer]. Darlene's number [phone] . Sent an email to [place] with [place] inquiring about the process of obtaining a portable 02 concentrator.   Patient Self Care Activities:  . Attends all scheduled provider appointments . Unable to  perform IADLs independently related to weakness, does not drive.   Initial goal documentation        The patient verbalized understanding of instructions provided today and declined a print copy of patient instruction materials.   The patient has been provided with contact information for the care management team and has been advised to call with any health related questions or concerns.

## 2019-01-17 ENCOUNTER — Telehealth: Payer: Self-pay | Admitting: Primary Care

## 2019-01-17 NOTE — Telephone Encounter (Signed)
Called patient to schedule Palliative Consult, no answer - left message with reason for call along with my contact information. °

## 2019-01-21 ENCOUNTER — Telehealth: Payer: Self-pay

## 2019-01-21 NOTE — Telephone Encounter (Signed)
I will work on this with Merlene Morse, I suspect due to transition to new oxygen company we are going to need to send him back to his pulmonologist at [facility] as they will be able to perform the detailed testing and provided background information.  Merlene Morse, can we work on getting him back in with his [facility] pulmonary provider ASAP + speak to their nurse staff in office about the requirements we need for this and send them a copy of the papers on this so they have it? I think this is the best way to ensure this patient continues to get his supplemental O2 and a portable tank.

## 2019-01-21 NOTE — Telephone Encounter (Signed)
Received a fax from [place] regarding the chart notes for O2 recently sent in. The fax states:   "The clinical notes need addendum, there was no discussion about benefit and usage of O2 and alternative treatment was missing. We also need O2 testing detailed result."  Revised chart note needs to be faxed to [phone]. Will route to provider and Janci as they have both been working with this patient.

## 2019-01-22 ENCOUNTER — Ambulatory Visit (INDEPENDENT_AMBULATORY_CARE_PROVIDER_SITE_OTHER): Payer: Medicare HMO | Admitting: *Deleted

## 2019-01-22 DIAGNOSIS — J9621 Acute and chronic respiratory failure with hypoxia: Secondary | ICD-10-CM

## 2019-01-22 DIAGNOSIS — J449 Chronic obstructive pulmonary disease, unspecified: Secondary | ICD-10-CM

## 2019-01-22 DIAGNOSIS — J9622 Acute and chronic respiratory failure with hypercapnia: Secondary | ICD-10-CM

## 2019-01-22 NOTE — Chronic Care Management (AMB) (Signed)
Chronic Care Management   Follow Up Note   01/22/2019 Name: Patrick Ayala MRN: [id number] DOB: July 22, 1940  Referred by: Patrick Maple, MD Reason for referral : Chronic Care Management (COPD) and Care Coordination (Pulmonology)   Patrick Ayala is a 78 y.o. year old male who is a primary care patient of Patrick Ayala, Patrick How, MD. The CCM team was consulted for assistance with chronic disease management and care coordination needs.    Review of patient status, including review of consultants reports, relevant laboratory and other test results, and collaboration with appropriate care team members and the patient's provider was performed as part of comprehensive patient evaluation and provision of chronic care management services.    SDOH (Social Determinants of Health) screening performed today: Advanced Directives. See Care Plan for related entries.   Advanced Directives Status: N See Care Plan and [facility] application for related entries.  Outpatient Encounter Medications as of 01/22/2019  Medication Sig Note  . acetaminophen (TYLENOL) 325 MG tablet Take 2 tablets (650 mg total) by mouth every 6 (six) hours as needed for mild pain (or Fever >/= 101). 06/19/2018: PRN  . albuterol (VENTOLIN HFA) 108 (90 Base) MCG/ACT inhaler Inhale 1-2 puffs into the lungs every 6 (six) hours as needed for wheezing or shortness of breath.   Marland Kitchen amLODipine (NORVASC) 5 MG tablet TAKE 1 TABLET (5 MG TOTAL) BY MOUTH DAILY.   Marland Kitchen aspirin 81 MG chewable tablet Chew by mouth.   Marland Kitchen atorvastatin (LIPITOR) 20 MG tablet TAKE 1 TABLET EVERY DAY FOR HYPERCHOLESTEROLEMIA   . budesonide-formoterol (SYMBICORT) 160-4.5 MCG/ACT inhaler Inhale 2 puffs into the lungs every morning. INHALE 2 TIMES DAILY   . feeding supplement, ENSURE ENLIVE, (ENSURE ENLIVE) LIQD Take 237 mLs by mouth 3 (three) times daily between meals. (Patient not taking: Reported on 10/30/2018)   . fluticasone (FLONASE) 50 MCG/ACT nasal spray    . furosemide (LASIX)  20 MG tablet TAKE 1 TABLET EVERY TUESDAY AND THURSDAY IN THE MORNING   . lisinopril (ZESTRIL) 20 MG tablet Take 1 tablet (20 mg total) by mouth daily.   . metoprolol succinate (TOPROL-XL) 25 MG 24 hr tablet TAKE 1 TABLET BY MOUTH EVERY DAY   . Multiple Vitamin (MULTIVITAMIN WITH MINERALS) TABS tablet Take 1 tablet by mouth daily.   Marland Kitchen Spacer/Aero-Holding Chambers (AEROCHAMBER W/FLOWSIGNAL) inhaler Inhale into the lungs.   . SPIRIVA HANDIHALER 18 MCG inhalation capsule Place 1 capsule (18 mcg total) into inhaler and inhale daily.   . tamsulosin (FLOMAX) 0.4 MG CAPS capsule TAKE 1 CAPSULE BY MOUTH EVERY DAY   . vitamin C (ASCORBIC ACID) 250 MG tablet Take 250 mg by mouth 2 (two) times daily.    No facility-administered encounter medications on file as of 01/22/2019.      Goals Addressed            This Visit's Progress   . I need a portable 02 concentrator and I would like palliative care (pt-stated)       Current Barriers:  . Chronic Disease Management support and education needs related to COPD and Heart Disease  . No Advanced Directives in place . Noted advanced COPD with need of Palliative support . Needs for re-qualifying for 02 and patient requests for a lighter portable 02 related to weakness.   Nurse Case Manager Clinical Goal(s):  Marland Kitchen Over the next 90 days, patient will work with [facility]  to address needs related to multiple Chronic disease processes   Interventions:  Met with  Patient, spouse and son in person at F/U MD appt.  . Evaluation of current treatment plan related to COPD and 02 needs and patient's adherence to plan as established by provider. Nash Dimmer with PCP regarding patient's needs for Palliative and 02 equipment . Discussed plans with patient for ongoing care management follow up and provided patient with direct contact information for care management team . Provided patient and/or caregiver with verbal  information about [place] (community  resource). . Called patient and made him aware palliative attempting to reach him, gave him the number to call, patient read back the number. Let him know I was continuing to work on his 02 re-qualifying. Patient stating he was feeling about the same. No new issues at this time.  [facility] pulmonary to request they see patient to assist with management of 02 services. Had to leave a VM for Lindi Adie, RN Nurse Coordinator@ [phone] office  . Called [employer] and spoke with Blake Divine [employer] Rep for [facility] MD offices [phone], to request assistance in re-qualifying patient for 02 and obtaining a portable 02 machine.   . Collaboration Inbaskets with Estevan Oaks NP with [facility]: She let me know she had been unable to reach patient for scheduling. After I reached patient and gave him her number I reached out to let her know he had her number.   Patient Self Care Activities:  . Attends all scheduled provider appointments . Unable to perform IADLs independently related to weakness, does not drive.   Please see past updates related to this goal by clicking on the "Past Updates" button in the selected goal          The patient has been provided with contact information for the care management team and has been advised to call with any health related questions or concerns.    Merlene Morse Kylyn Sookram RN, BSN Nurse Case Editor, commissioning Family Practice/[facility] Care Management  [phone]) Business Mobile

## 2019-01-26 NOTE — Patient Instructions (Signed)
Thank you allowing the Chronic Care Management Team to be a part of your care! It was a pleasure speaking with you today!  CCM (Chronic Care Management) Team   Kyion Gautier RN, BSN Nurse Care Coordinator  [phone]  Catie [facility] PharmD  Clinical Pharmacist  [phone]  Eula Fried LCSW Clinical Social Worker [phone]  Goals Addressed            This Visit's Progress   . I need a portable 02 concentrator and I would like palliative care (pt-stated)       Current Barriers:  . Chronic Disease Management support and education needs related to COPD and Heart Disease  . No Advanced Directives in place . Noted advanced COPD with need of Palliative support . Needs for re-qualifying for 02 and patient requests for a lighter portable 02 related to weakness.   Nurse Case Manager Clinical Goal(s):  Marland Kitchen Over the next 90 days, patient will work with [facility]  to address needs related to multiple Chronic disease processes   Interventions:  Met with Patient, spouse and son in person at F/U MD appt.  . Evaluation of current treatment plan related to COPD and 02 needs and patient's adherence to plan as established by provider. Nash Dimmer with PCP regarding patient's needs for Palliative and 02 equipment . Discussed plans with patient for ongoing care management follow up and provided patient with direct contact information for care management team . Provided patient and/or caregiver with verbal  information about [place] (community resource). . Called patient and made him aware palliative attempting to reach him, gave him the number to call, patient read back the number. Let him know I was continuing to work on his 02 re-qualifying. Patient stating he was feeling about the same. No new issues at this time.  [facility] pulmonary to request they see patient to assist with management of 02 services. Had to leave a VM for Lindi Adie, RN Nurse Coordinator@  [phone] office  . Called [employer] and spoke with Blake Divine [employer] Rep for [facility] MD offices [phone], to request assistance in re-qualifying patient for 02 and obtaining a portable 02 machine.   . Collaboration Inbaskets with Estevan Oaks NP with [facility]: She let me know she had been unable to reach patient for scheduling. After I reached patient and gave him her number I reached out to let her know he had her number.   Patient Self Care Activities:  . Attends all scheduled provider appointments . Unable to perform IADLs independently related to weakness, does not drive.   Please see past updates related to this goal by clicking on the "Past Updates" button in the selected goal         The patient verbalized understanding of instructions provided today and declined a print copy of patient instruction materials.   The patient has been provided with contact information for the care management team and has been advised to call with any health related questions or concerns.

## 2019-01-28 ENCOUNTER — Other Ambulatory Visit: Payer: Self-pay | Admitting: Primary Care

## 2019-01-28 ENCOUNTER — Other Ambulatory Visit: Payer: Self-pay

## 2019-01-28 DIAGNOSIS — Z515 Encounter for palliative care: Secondary | ICD-10-CM

## 2019-01-28 NOTE — Progress Notes (Signed)
[place] Consult Note Telephone: [phone]  Fax: [phone]   PATIENT NAME: Patrick Ayala [address] [place] [address] [phone] (home)  DOB: 04-Jul-1940 MRN: [id number]  PRIMARY CARE PROVIDER:  Venita Lick, NP [place] [id number]   REFERRING PROVIDER:  Guadalupe Maple, MD [address] [place],  [place] [address] [phone]  RESPONSIBLE PARTY:   Extended Emergency Contact Information Primary Emergency Contact: Barcellos,Darlene L Address: [address]          [place], [place] [address] Johnnette Litter of [place] Phone: [phone] Relation: Spouse   ASSESSMENT AND RECOMMENDATIONS:   1. Advance Care Planning/Goals of Care: Goals include to maximize quality of life and symptom management. Patient and wife state they don't have POA or living will/advance directives. I left a MOST with the description we'd go over it next time and perhaps they could review before. I also gave the [employer] book that goes over advance directives as well as provides a POA form.  2. Symptom Management:   Dyspnea: COPD, smoker. States has used trilogy x 1 year, feels more rested. Sates he got it from [facility].Denies production, still smokes about 1 pack every 3 days.  Quit  X 6 months once. States he has tried to quit before. We discussed attempting again, and benefits of quitting. We discussed various methods.  RE his pulmonary plan, he states he has no nebulizer. He may benefit from being able to do nebulized medications as needed for albuterol and later other medication,  if he struggles with accurate inhaler technique.  [employer] is DME supplier for NIV but [employer] seems to be supplying oxygen. PCP office is also negotiating this DME and trying to get portable oxygen. Marland Kitchen He is also seen at [facility] by [place],  [place] [address], and has the NIV likely from them.   Endurance: Walks in home with oxygen. 2.5 L/ [place],  uses most of time except when smoking. States he smokes outside.  Nutrition: Eats 2 meals a day, does not have much appetite. BMI is 15.8 if his reported height and weight are accurate.  Pain: Denies pain. Endorses poor stamina and much fatigue  3. Family /Caregiver/Community Supports:  Lives with wife and son, daughter also helps out. Pt wife was in our interview but did not identify any needed services or help, although given the opportunity. Will continue to assess.   4. Cognitive / Functional decline: Feels he is alert and oriented x 3 and denies feeling "foggy" due to hypoxia. Seems to have some functional decline in past year, due to stamina, and self reports this.  5. Follow up Palliative Care Visit: Palliative care will continue to follow for goals of care clarification and symptom management. Return 4-6 weeks or prn.  I spent 60 minutes providing this consultation,  from 1400 to 1500. More than 50% of the time in this consultation was spent coordinating communication.   HISTORY OF PRESENT ILLNESS:  Patrick Ayala is a 78 y.o. year old male with multiple medical problems including protein calorie malnutrition, COPD, tobacco abuse, oxygen dependence. Palliative Care was asked to follow this patient by consultation request of Venita Lick, NP  to help address advance care planning and goals of care. This is the  initial visit.  CODE STATUS: TBD  PPS: 40% HOSPICE ELIGIBILITY/DIAGNOSIS: TBD  PAST MEDICAL HISTORY:  Past Medical History:  Diagnosis Date  .  Carboxyhemoglobinemia   . COPD (chronic obstructive pulmonary disease) (HCC)    on 2.5L chronic home o2  . Hypertension   . Polycythemia, secondary 10/20/2014  . Thrombocytopenia ([place])     SOCIAL HX:  Social History   Tobacco Use  . Smoking status: Current Some Day Smoker    Packs/day: 0.50    Types: Cigarettes  . Smokeless tobacco: Never Used  . Tobacco comment: Smoking History 1(1)Packs per day  Substance Use  Topics  . Alcohol use: Yes    Alcohol/week: 5.0 standard drinks    Types: 5 Cans of beer per week    ALLERGIES: No Known Allergies   PERTINENT MEDICATIONS:  Outpatient Encounter Medications as of 01/28/2019  Medication Sig  . acetaminophen (TYLENOL) 325 MG tablet Take 2 tablets (650 mg total) by mouth every 6 (six) hours as needed for mild pain (or Fever >/= 101).  Marland Kitchen albuterol (VENTOLIN HFA) 108 (90 Base) MCG/ACT inhaler Inhale 1-2 puffs into the lungs every 6 (six) hours as needed for wheezing or shortness of breath.  Marland Kitchen amLODipine (NORVASC) 5 MG tablet TAKE 1 TABLET (5 MG TOTAL) BY MOUTH DAILY.  Marland Kitchen aspirin 81 MG chewable tablet Chew by mouth.  Marland Kitchen atorvastatin (LIPITOR) 20 MG tablet TAKE 1 TABLET EVERY DAY FOR HYPERCHOLESTEROLEMIA  . budesonide-formoterol (SYMBICORT) 160-4.5 MCG/ACT inhaler Inhale 2 puffs into the lungs every morning. INHALE 2 TIMES DAILY  . feeding supplement, ENSURE ENLIVE, (ENSURE ENLIVE) LIQD Take 237 mLs by mouth 3 (three) times daily between meals. (Patient not taking: Reported on 10/30/2018)  . fluticasone (FLONASE) 50 MCG/ACT nasal spray   . furosemide (LASIX) 20 MG tablet TAKE 1 TABLET EVERY TUESDAY AND THURSDAY IN THE MORNING  . lisinopril (ZESTRIL) 20 MG tablet Take 1 tablet (20 mg total) by mouth daily.  . metoprolol succinate (TOPROL-XL) 25 MG 24 hr tablet TAKE 1 TABLET BY MOUTH EVERY DAY  . Multiple Vitamin (MULTIVITAMIN WITH MINERALS) TABS tablet Take 1 tablet by mouth daily.  Marland Kitchen Spacer/Aero-Holding Chambers (AEROCHAMBER W/FLOWSIGNAL) inhaler Inhale into the lungs.  . SPIRIVA HANDIHALER 18 MCG inhalation capsule Place 1 capsule (18 mcg total) into inhaler and inhale daily.  . tamsulosin (FLOMAX) 0.4 MG CAPS capsule TAKE 1 CAPSULE BY MOUTH EVERY DAY  . vitamin C (ASCORBIC ACID) 250 MG tablet Take 250 mg by mouth 2 (two) times daily.   No facility-administered encounter medications on file as of 01/28/2019.     PHYSICAL EXAM / ROS:   Current and past weights:  reports 113 lbs, 5'11", if correct, BMI = 15.1 General: NAD, frail appearing, very thin HEENT: States ear dullness in right ear. No pain in ear Cardiovascular: no chest pain reported, no edema, significant mumur, S1S2 Pulmonary: no cough, no increased SOB, + DOE, Wheezes and rales throughout lung fields Abdomen: appetite fair, endorses constipation, incontinent of bowel, ensure in the past but does not use this now. GU: denies dysuria, continent of urine MSK:  no joint deformities, ambulatory in home. Denies falls Skin: no rashes or wounds reported Neurological: Weakness, sleeps well, gets up once in night and returns to sleep,   Cyndia Skeeters DNP ,MPH, AGPCNP-BC   COVID-19 PATIENT SCREENING TOOL  Person answering questions: ____________self_______ _____   1.  Is the patient or any family member in the home showing any signs or symptoms regarding respiratory infection?               Person with Symptom- ________na___________________  a. Fever  Yes___ No___          ___________________  b. Shortness of breath                                                    Yes___ No___          ___________________ c. Cough/congestion                                       Yes___  No___         ___________________ d. Body aches/pains                                                         Yes___ No___        ____________________ e. Gastrointestinal symptoms (diarrhea, nausea)           Yes___ No___        ____________________  2. Within the past 14 days, has anyone living in the home had any contact with someone with or under investigation for COVID-19?    Yes___ No_x_   Person __________________

## 2019-01-29 ENCOUNTER — Ambulatory Visit: Payer: Self-pay | Admitting: *Deleted

## 2019-01-29 DIAGNOSIS — J9622 Acute and chronic respiratory failure with hypercapnia: Secondary | ICD-10-CM | POA: Diagnosis not present

## 2019-01-29 DIAGNOSIS — J449 Chronic obstructive pulmonary disease, unspecified: Secondary | ICD-10-CM

## 2019-01-29 NOTE — Chronic Care Management (AMB) (Signed)
Chronic Care Management   Follow Up Note   01/29/2019 Name: Patrick Ayala MRN: [id number] DOB: 1940-05-15  Referred by: Patrick Lick, NP Reason for referral : Care Coordination [phone] supplier and Palliative)   Patrick Ayala is a 78 y.o. year old male who is a primary care patient of [name], Patrick Faster, NP. The CCM team was consulted for assistance with chronic disease management and care coordination needs.    Review of patient status, including review of consultants reports, relevant laboratory and other test results, and collaboration with appropriate care team members and the patient's provider was performed as part of comprehensive patient evaluation and provision of chronic care management services.    SDOH (Social Determinants of Health) screening performed today: None. See Care Plan for related entries.   Advanced Directives Status: N See Care Plan and Vynca application for related entries.  Outpatient Encounter Medications as of 01/29/2019  Medication Sig Note  . acetaminophen (TYLENOL) 325 MG tablet Take 2 tablets (650 mg total) by mouth every 6 (six) hours as needed for mild pain (or Fever >/= 101). 06/19/2018: PRN  . albuterol (VENTOLIN HFA) 108 (90 Base) MCG/ACT inhaler Inhale 1-2 puffs into the lungs every 6 (six) hours as needed for wheezing or shortness of breath.   Marland Kitchen amLODipine (NORVASC) 5 MG tablet TAKE 1 TABLET (5 MG TOTAL) BY MOUTH DAILY.   Marland Kitchen aspirin 81 MG chewable tablet Chew by mouth.   Marland Kitchen atorvastatin (LIPITOR) 20 MG tablet TAKE 1 TABLET EVERY DAY FOR HYPERCHOLESTEROLEMIA   . budesonide-formoterol (SYMBICORT) 160-4.5 MCG/ACT inhaler Inhale 2 puffs into the lungs every morning. INHALE 2 TIMES DAILY   . feeding supplement, ENSURE ENLIVE, (ENSURE ENLIVE) LIQD Take 237 mLs by mouth 3 (three) times daily between meals. (Patient not taking: Reported on 10/30/2018)   . fluticasone (FLONASE) 50 MCG/ACT nasal spray    . furosemide (LASIX) 20 MG tablet TAKE 1 TABLET EVERY  TUESDAY AND THURSDAY IN THE MORNING   . lisinopril (ZESTRIL) 20 MG tablet Take 1 tablet (20 mg total) by mouth daily.   . metoprolol succinate (TOPROL-XL) 25 MG 24 hr tablet TAKE 1 TABLET BY MOUTH EVERY DAY   . Multiple Vitamin (MULTIVITAMIN WITH MINERALS) TABS tablet Take 1 tablet by mouth daily.   Marland Kitchen Spacer/Aero-Holding Chambers (AEROCHAMBER W/FLOWSIGNAL) inhaler Inhale into the lungs.   . SPIRIVA HANDIHALER 18 MCG inhalation capsule Place 1 capsule (18 mcg total) into inhaler and inhale daily.   . tamsulosin (FLOMAX) 0.4 MG CAPS capsule TAKE 1 CAPSULE BY MOUTH EVERY DAY   . vitamin C (ASCORBIC ACID) 250 MG tablet Take 250 mg by mouth 2 (two) times daily.    No facility-administered encounter medications on file as of 01/29/2019.      Goals Addressed            This Visit's Progress   . I need a portable 02 concentrator and I would like palliative care (pt-stated)       Current Barriers:  . Chronic Disease Management support and education needs related to COPD and Heart Disease  . No Advanced Directives in place . Noted advanced COPD with need of Palliative support . Needs for re-qualifying for 02 and patient requests for a lighter portable 02 related to weakness.   Nurse Case Manager Clinical Goal(s):  Marland Kitchen Over the next 90 days, patient will work with [facility]  to address needs related to multiple Chronic disease processes   Interventions:  Met with Patient, spouse and  son in person at F/U MD appt.  Marland Kitchen Collaborated with Patrick Ayala [employer] Rep for [facility] MD offices [phone], regarding patient's need 02 equipment, she stated she has reached out to the 02 team and plans to reach out again.  . Collaboration messages from Estevan Oaks NP with [facility]. She was able to see patient in his home yesterday. Plans to continue to build rapport with family and patient.    Patient Self Care Activities:  . Attends all scheduled provider appointments . Unable to perform IADLs  independently related to weakness, does not drive.   Please see past updates related to this goal by clicking on the "Past Updates" button in the selected goal          The patient has been provided with contact information for the care management team and has been advised to call with any health related questions or concerns.    Patrick Morse Jeidi Gilles RN, BSN Nurse Case Editor, commissioning Family Practice/[facility] Care Management  [phone]) Business Mobile

## 2019-02-20 ENCOUNTER — Ambulatory Visit: Payer: Self-pay | Admitting: *Deleted

## 2019-02-20 DIAGNOSIS — J449 Chronic obstructive pulmonary disease, unspecified: Secondary | ICD-10-CM

## 2019-02-20 NOTE — Chronic Care Management (AMB) (Signed)
Chronic Care Management   Follow Up Note   02/20/2019 Name: Patrick Ayala MRN: [id number] DOB: 09/05/1940  Referred by: Venita Lick, NP Reason for referral : Care Coordination [phone] requalification/Portable 02 )   Patrick Ayala is a 78 y.o. year old male who is a primary care patient of [name], [name], NP. The CCM team was consulted for assistance with chronic disease management and care coordination needs.    Review of patient status, including review of consultants reports, relevant laboratory and other test results, and collaboration with appropriate care team members and the patient's provider was performed as part of comprehensive patient evaluation and provision of chronic care management services.    SDOH (Social Determinants of Health) screening performed today: None. See Care Plan for related entries.   Outpatient Encounter Medications as of 02/20/2019  Medication Sig Note  . acetaminophen (TYLENOL) 325 MG tablet Take 2 tablets (650 mg total) by mouth every 6 (six) hours as needed for mild pain (or Fever >/= 101). 06/19/2018: PRN  . albuterol (VENTOLIN HFA) 108 (90 Base) MCG/ACT inhaler Inhale 1-2 puffs into the lungs every 6 (six) hours as needed for wheezing or shortness of breath.   Marland Kitchen amLODipine (NORVASC) 5 MG tablet TAKE 1 TABLET (5 MG TOTAL) BY MOUTH DAILY.   Marland Kitchen aspirin 81 MG chewable tablet Chew by mouth.   Marland Kitchen atorvastatin (LIPITOR) 20 MG tablet TAKE 1 TABLET EVERY DAY FOR HYPERCHOLESTEROLEMIA   . budesonide-formoterol (SYMBICORT) 160-4.5 MCG/ACT inhaler Inhale 2 puffs into the lungs every morning. INHALE 2 TIMES DAILY   . feeding supplement, ENSURE ENLIVE, (ENSURE ENLIVE) LIQD Take 237 mLs by mouth 3 (three) times daily between meals. (Patient not taking: Reported on 10/30/2018)   . fluticasone (FLONASE) 50 MCG/ACT nasal spray    . furosemide (LASIX) 20 MG tablet TAKE 1 TABLET EVERY TUESDAY AND THURSDAY IN THE MORNING   . lisinopril (ZESTRIL) 20 MG tablet Take 1  tablet (20 mg total) by mouth daily.   . metoprolol succinate (TOPROL-XL) 25 MG 24 hr tablet TAKE 1 TABLET BY MOUTH EVERY DAY   . Multiple Vitamin (MULTIVITAMIN WITH MINERALS) TABS tablet Take 1 tablet by mouth daily.   Marland Kitchen Spacer/Aero-Holding Chambers (AEROCHAMBER W/FLOWSIGNAL) inhaler Inhale into the lungs.   . SPIRIVA HANDIHALER 18 MCG inhalation capsule Place 1 capsule (18 mcg total) into inhaler and inhale daily.   . tamsulosin (FLOMAX) 0.4 MG CAPS capsule TAKE 1 CAPSULE BY MOUTH EVERY DAY   . vitamin C (ASCORBIC ACID) 250 MG tablet Take 250 mg by mouth 2 (two) times daily.    No facility-administered encounter medications on file as of 02/20/2019.      Goals Addressed            This Visit's Progress   . I need a portable 02 concentrator and I would like palliative care (pt-stated)       Current Barriers:  . Chronic Disease Management support and education needs related to COPD and Heart Disease  . No Advanced Directives in place . Noted advanced COPD with need of Palliative support . Needs for re-qualifying for 02 and patient requests for a lighter portable 02 related to weakness.   Nurse Case Manager Clinical Goal(s):  Marland Kitchen Over the next 90 days, patient will work with [facility]  to address needs related to multiple Chronic disease processes   Interventions:  . Called billing dept at [facility] [phone], representative stating she had no paperwork showing on patient's file since  Sept 14 2020. Requested office note from 10/7 be refaxed. Billing stated for him to receive the light weight portable 02 tank he would need a prescription and accompanying documentation as to why this is necessary.  Marland Kitchen Collaborated with Blake Divine [employer] Rep for [facility] MD offices [phone], regarding patient's need 02 equipment, she plans to  refax office note from 10/7 which has 02 qualifying information.  . Will reach out to billing next week to confirm they have received qualifying note for 02.   . Plan to collaborate with PCP on portable 02 needs    Patient Self Care Activities:  . Attends all scheduled provider appointments . Unable to perform IADLs independently related to weakness, does not drive.   Please see past updates related to this goal by clicking on the "Past Updates" button in the selected goal          The care management team will reach out to the patient again over the next 30 days.    Merlene Morse Donzella Carrol RN, BSN Nurse Case Editor, commissioning Family Practice/[facility] Care Management  [phone]) Business Mobile

## 2019-02-26 ENCOUNTER — Telehealth: Payer: Self-pay

## 2019-03-01 DIAGNOSIS — J449 Chronic obstructive pulmonary disease, unspecified: Secondary | ICD-10-CM | POA: Diagnosis not present

## 2019-03-01 DIAGNOSIS — J9622 Acute and chronic respiratory failure with hypercapnia: Secondary | ICD-10-CM | POA: Diagnosis not present

## 2019-03-17 ENCOUNTER — Other Ambulatory Visit: Payer: Self-pay

## 2019-03-17 MED ORDER — AMLODIPINE BESYLATE 5 MG PO TABS: ORAL_TABLET | ORAL | 1 refills | Status: DC

## 2019-03-17 MED ORDER — LISINOPRIL 20 MG PO TABS: 20.0000 mg | ORAL_TABLET | Freq: Every day | ORAL | 2 refills | Status: DC

## 2019-03-17 NOTE — Telephone Encounter (Signed)
Last RX sent in earlier did not get to the pharmacy. Please resend.

## 2019-03-25 ENCOUNTER — Other Ambulatory Visit: Payer: Self-pay | Admitting: Nurse Practitioner

## 2019-03-25 ENCOUNTER — Telehealth: Payer: Self-pay

## 2019-03-25 MED ORDER — PANTOPRAZOLE SODIUM 40 MG PO TBEC: 40.0000 mg | DELAYED_RELEASE_TABLET | Freq: Every day | ORAL | 3 refills | Status: DC

## 2019-03-25 NOTE — Telephone Encounter (Signed)
Script sent

## 2019-03-25 NOTE — Telephone Encounter (Signed)
Fax from pharmacy. New prescription request for Pantoprazole 62m tablet.   Routing to provider to advise.

## 2019-03-25 NOTE — Telephone Encounter (Signed)
Medication Refill - Medication: budesonide-formoterol (SYMBICORT) 160-4.5 MCG/ACT inhaler  Has the patient contacted their pharmacy? Yes - states they can not refill until February - but pt is completely out now (Agent: If no, request that the patient contact the pharmacy for the refill.) (Agent: If yes, when and what did the pharmacy advise?)  Preferred Pharmacy (with phone number or street name):  [id number] [address] Phone:  [phone] Fax:  [phone]    Agent: Please be advised that RX refills may take up to 3 business days. We ask that you follow-up with your pharmacy.

## 2019-03-28 ENCOUNTER — Ambulatory Visit (INDEPENDENT_AMBULATORY_CARE_PROVIDER_SITE_OTHER): Payer: Medicare HMO | Admitting: *Deleted

## 2019-03-28 DIAGNOSIS — I2781 Cor pulmonale (chronic): Secondary | ICD-10-CM

## 2019-03-28 DIAGNOSIS — J449 Chronic obstructive pulmonary disease, unspecified: Secondary | ICD-10-CM | POA: Diagnosis not present

## 2019-03-28 NOTE — Patient Instructions (Signed)
Thank you allowing the Chronic Care Management Team to be a part of your care! It was a pleasure speaking with you today!  CCM (Chronic Care Management) Team   Tracie Dore RN, BSN Nurse Care Coordinator  [phone]  Catie [facility] PharmD  Clinical Pharmacist  [phone]  Eula Fried LCSW Clinical Social Worker [phone]  Goals Addressed            This Visit's Progress   . I need a portable 02 concentrator and I would like palliative care (pt-stated)       Current Barriers:  . Chronic Disease Management support and education needs related to COPD and Heart Disease  . No Advanced Directives in place . Noted advanced COPD with need of Palliative support . Needs for re-qualifying for 02 and patient requests for a lighter portable 02 related to weakness.   Nurse Case Manager Clinical Goal(s):  Marland Kitchen Over the next 90 days, patient will work with [facility]  to address needs related to multiple Chronic disease processes   Interventions:  . Called billing dept at [facility] [phone], representative stating she has received the notes from 10/7 showing patient was 87% on 2.5lpm on the walk test but since there was not a room air test they can not re-qualify patient. Testing is specific to needing a resting room air sat of less than 88% or the complete 6 min walk test which patient was unable to complete. . Placed a call to [facility] pulmonology [phone]  to request assistance with obtaining these numbers at upcoming appointment 12/22. Left VM requesting a return call. No call back by the end of the day . Discussed plans with patient for ongoing care management follow up and provided patient with direct contact information for care management team . Inbasket message sent to palliative to see if there is planned f/u.  Marland Kitchen Plan to f/u next week with patient  Patient Self Care Activities:  . Attends all scheduled provider appointments . Unable to perform IADLs independently related to  weakness, does not drive.   Please see past updates related to this goal by clicking on the "Past Updates" button in the selected goal

## 2019-03-28 NOTE — Chronic Care Management (AMB) (Signed)
Chronic Care Management   Follow Up Note   03/28/2019 Name: Patrick Ayala MRN: [id number] DOB: March 04, 1941  Referred by: Venita Lick, NP Reason for referral : No chief complaint on file.   Patrick Ayala is a 78 y.o. year old male who is a primary care patient of [name], Patrick Faster, NP. The CCM team was consulted for assistance with chronic disease management and care coordination needs.    Review of patient status, including review of consultants reports, relevant laboratory and other test results, and collaboration with appropriate care team members and the patient's provider was performed as part of comprehensive patient evaluation and provision of chronic care management services.    SDOH (Social Determinants of Health) screening performed today: None. See Care Plan for related entries.   Outpatient Encounter Medications as of 03/28/2019  Medication Sig Note  . acetaminophen (TYLENOL) 325 MG tablet Take 2 tablets (650 mg total) by mouth every 6 (six) hours as needed for mild pain (or Fever >/= 101). 06/19/2018: PRN  . albuterol (VENTOLIN HFA) 108 (90 Base) MCG/ACT inhaler Inhale 1-2 puffs into the lungs every 6 (six) hours as needed for wheezing or shortness of breath.   Marland Kitchen amLODipine (NORVASC) 5 MG tablet TAKE 1 TABLET (5 MG TOTAL) BY MOUTH DAILY.   Marland Kitchen aspirin 81 MG chewable tablet Chew by mouth.   Marland Kitchen atorvastatin (LIPITOR) 20 MG tablet TAKE 1 TABLET EVERY DAY FOR HYPERCHOLESTEROLEMIA   . budesonide-formoterol (SYMBICORT) 160-4.5 MCG/ACT inhaler Inhale 2 puffs into the lungs every morning. INHALE 2 TIMES DAILY   . feeding supplement, ENSURE ENLIVE, (ENSURE ENLIVE) LIQD Take 237 mLs by mouth 3 (three) times daily between meals. (Patient not taking: Reported on 10/30/2018)   . fluticasone (FLONASE) 50 MCG/ACT nasal spray    . furosemide (LASIX) 20 MG tablet TAKE 1 TABLET EVERY TUESDAY AND THURSDAY IN THE MORNING   . lisinopril (ZESTRIL) 20 MG tablet Take 1 tablet (20 mg total) by  mouth daily.   . metoprolol succinate (TOPROL-XL) 25 MG 24 hr tablet TAKE 1 TABLET BY MOUTH EVERY DAY   . Multiple Vitamin (MULTIVITAMIN WITH MINERALS) TABS tablet Take 1 tablet by mouth daily.   . pantoprazole (PROTONIX) 40 MG tablet Take 1 tablet (40 mg total) by mouth daily.   Marland Kitchen Spacer/Aero-Holding Chambers (AEROCHAMBER W/FLOWSIGNAL) inhaler Inhale into the lungs.   . SPIRIVA HANDIHALER 18 MCG inhalation capsule Place 1 capsule (18 mcg total) into inhaler and inhale daily.   . tamsulosin (FLOMAX) 0.4 MG CAPS capsule TAKE 1 CAPSULE BY MOUTH EVERY DAY   . vitamin C (ASCORBIC ACID) 250 MG tablet Take 250 mg by mouth 2 (two) times daily.    No facility-administered encounter medications on file as of 03/28/2019.     Goals Addressed            This Visit's Progress   . I need a portable 02 concentrator and I would like palliative care (pt-stated)       Current Barriers:  . Chronic Disease Management support and education needs related to COPD and Heart Disease  . No Advanced Directives in place . Noted advanced COPD with need of Palliative support . Needs for re-qualifying for 02 and patient requests for a lighter portable 02 related to weakness.   Nurse Case Manager Clinical Goal(s):  Marland Kitchen Over the next 90 days, patient will work with [facility]  to address needs related to multiple Chronic disease processes   Interventions:  . Called billing dept  at [facility], representative stating she has received the notes from 10/7 showing patient was 87% on 2.5lpm on the walk test but since there was not a room air test they can not re-qualify patient. Testing is specific to needing a resting room air sat of less than 88% or the complete 6 min walk test which patient was unable to complete. . Placed a call to [facility] pulmonology to request assistance with obtaining these numbers at upcoming appointment 12/22. Left VM requesting a return call.  . Discussed plans with patient for ongoing care  management follow up and provided patient with direct contact information for care management team  Patient Self Care Activities:  . Attends all scheduled provider appointments . Unable to perform IADLs independently related to weakness, does not drive.   Please see past updates related to this goal by clicking on the "Past Updates" button in the selected goal          The care management team will reach out to the patient again over the next 14 days.    Merlene Morse Jasreet Dickie RN, BSN Nurse Case Editor, commissioning Family Practice/[facility] Care Management  [phone]) Business Mobile

## 2019-03-31 DIAGNOSIS — J449 Chronic obstructive pulmonary disease, unspecified: Secondary | ICD-10-CM | POA: Diagnosis not present

## 2019-03-31 DIAGNOSIS — J9622 Acute and chronic respiratory failure with hypercapnia: Secondary | ICD-10-CM | POA: Diagnosis not present

## 2019-04-09 ENCOUNTER — Telehealth: Payer: Self-pay

## 2019-04-10 ENCOUNTER — Telehealth: Payer: Self-pay | Admitting: Primary Care

## 2019-04-10 NOTE — Telephone Encounter (Signed)
Called home to make palliative medicine follow up appt for home visit. Message left.

## 2019-04-11 IMAGING — DX DG CHEST 1V PORT
1 series · 2 of 2 positions shown · non-contrast
Comparison: CT chest 07/03/2016 and chest radiograph 01/30/2016.

CLINICAL DATA: Chest pain and productive cough for 2 days.

EXAM:
PORTABLE CHEST 1 VIEW

[Series 1: chest ap · 0.14mm/px · 2 of 2 slices shown]
[im 1/2]
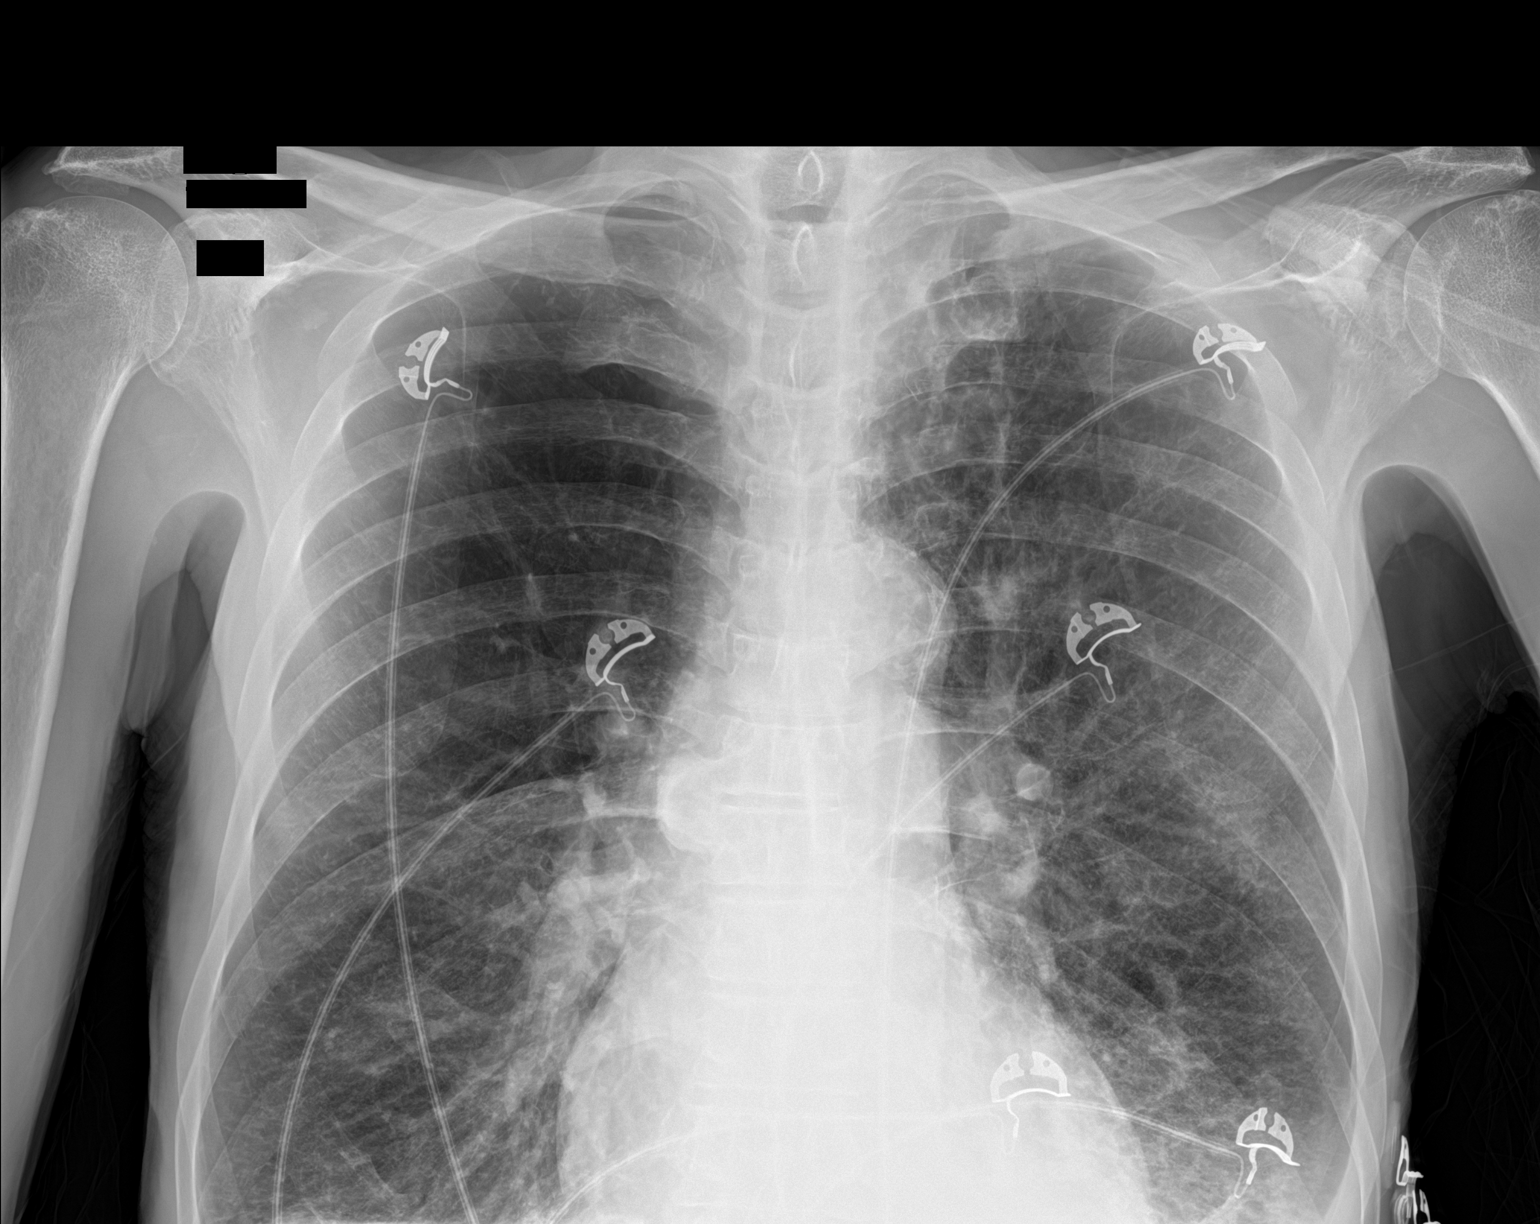
[im 2/2]
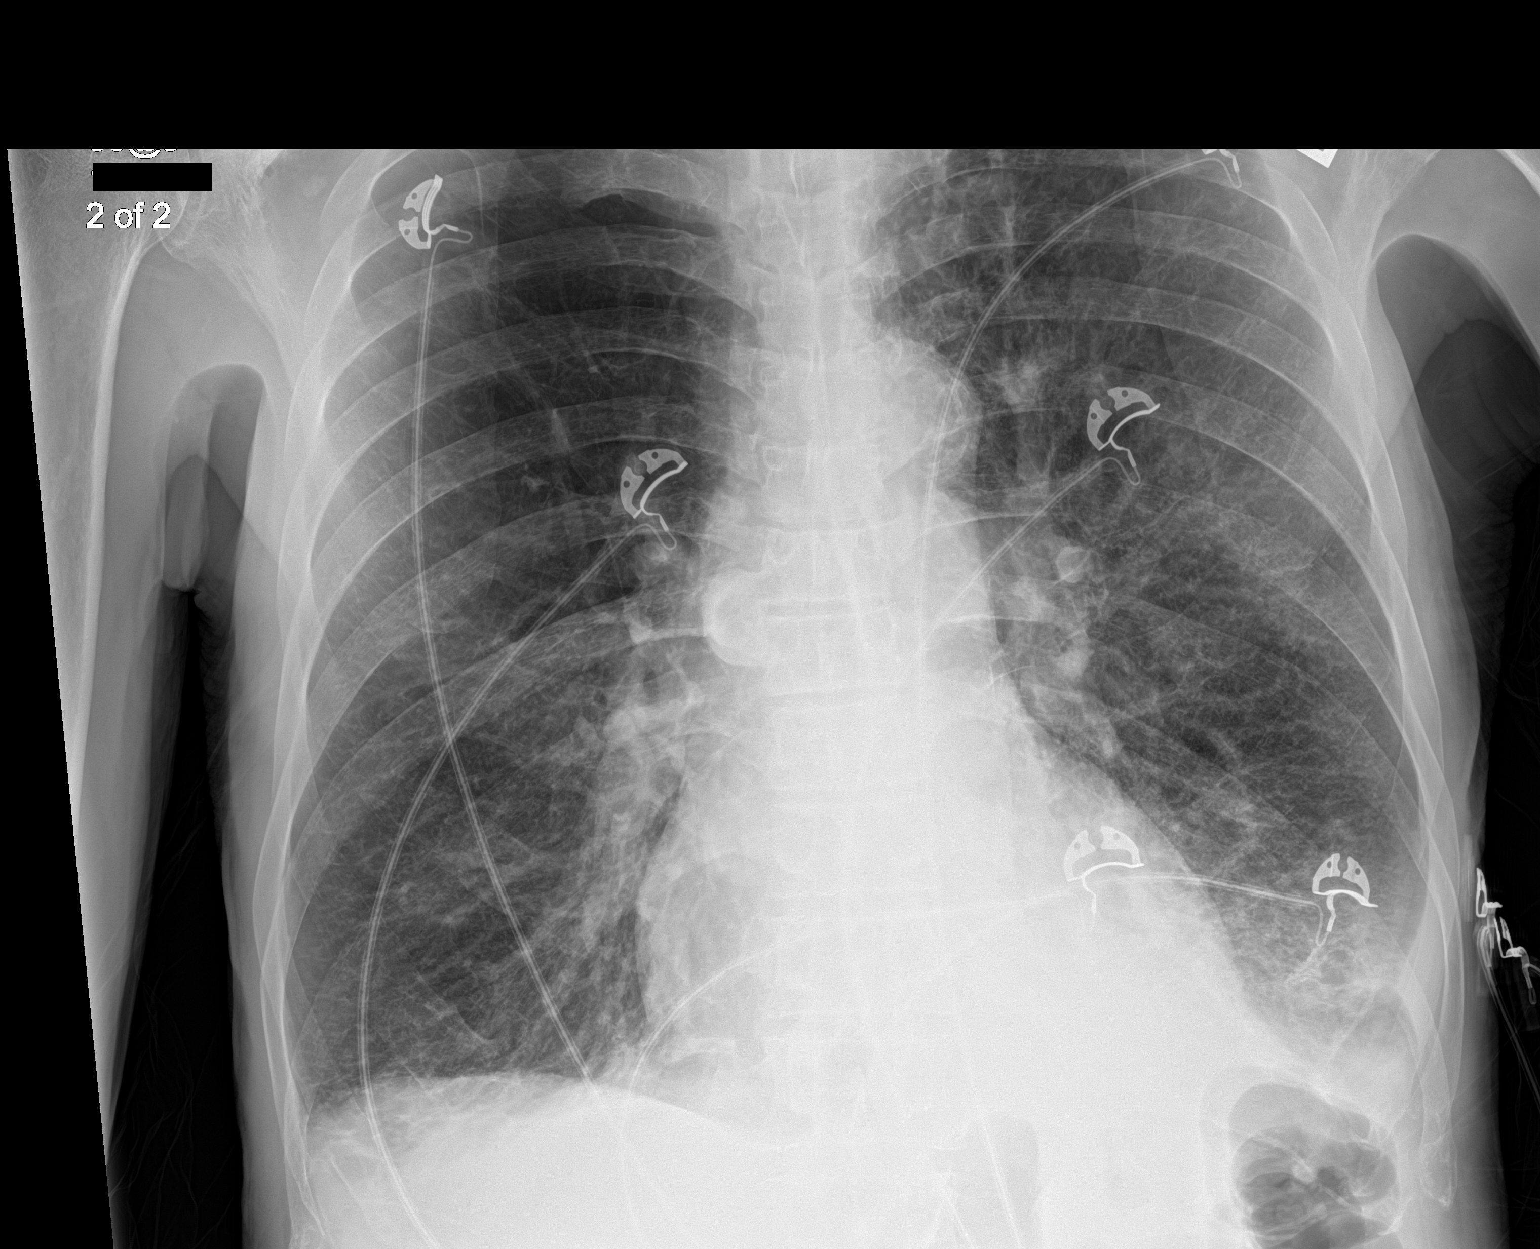

[2 of 2 positions shown; findings below may reference images not displayed]

FINDINGS: Trachea is midline. Heart size within normal limits. There is left
lower lobe airspace consolidation with a small left pleural
effusion. Lungs are emphysematous. Mild basilar atelectasis on the
right.
IMPRESSION: 1. Left lower lobe consolidation, most consistent with pneumonia.
The possibility of a centrally obstructing lesion is excluded on
07/03/2016.
2. Small left parapneumonic effusion.
3. Minimal right basilar atelectasis.
4. Emphysema.

## 2019-04-16 ENCOUNTER — Telehealth: Payer: Self-pay

## 2019-04-18 ENCOUNTER — Ambulatory Visit: Payer: Self-pay | Admitting: *Deleted

## 2019-04-18 DIAGNOSIS — J449 Chronic obstructive pulmonary disease, unspecified: Secondary | ICD-10-CM

## 2019-04-18 DIAGNOSIS — I2781 Cor pulmonale (chronic): Secondary | ICD-10-CM

## 2019-04-18 DIAGNOSIS — J9621 Acute and chronic respiratory failure with hypoxia: Secondary | ICD-10-CM

## 2019-04-18 NOTE — Chronic Care Management (AMB) (Signed)
Chronic Care Management   Follow Up Note   04/18/2019 Name: Patrick Ayala MRN: [id number] DOB: 10/24/1940  Referred by: Venita Lick, NP Reason for referral : Care Coordination (Portable 02 tank )   Patrick Ayala is a 79 y.o. year old male who is a primary care patient of [name], [name], NP. The CCM team was consulted for assistance with chronic disease management and care coordination needs.    Review of patient status, including review of consultants reports, relevant laboratory and other test results, and collaboration with appropriate care team members and the patient's provider was performed as part of comprehensive patient evaluation and provision of chronic care management services.    SDOH (Social Determinants of Health) screening performed today: None. See Care Plan for related entries.   Outpatient Encounter Medications as of 04/18/2019  Medication Sig Note  . acetaminophen (TYLENOL) 325 MG tablet Take 2 tablets (650 mg total) by mouth every 6 (six) hours as needed for mild pain (or Fever >/= 101). 06/19/2018: PRN  . albuterol (VENTOLIN HFA) 108 (90 Base) MCG/ACT inhaler Inhale 1-2 puffs into the lungs every 6 (six) hours as needed for wheezing or shortness of breath.   Marland Kitchen amLODipine (NORVASC) 5 MG tablet TAKE 1 TABLET (5 MG TOTAL) BY MOUTH DAILY.   Marland Kitchen aspirin 81 MG chewable tablet Chew by mouth.   Marland Kitchen atorvastatin (LIPITOR) 20 MG tablet TAKE 1 TABLET EVERY DAY FOR HYPERCHOLESTEROLEMIA   . budesonide-formoterol (SYMBICORT) 160-4.5 MCG/ACT inhaler Inhale 2 puffs into the lungs every morning. INHALE 2 TIMES DAILY   . feeding supplement, ENSURE ENLIVE, (ENSURE ENLIVE) LIQD Take 237 mLs by mouth 3 (three) times daily between meals. (Patient not taking: Reported on 10/30/2018)   . fluticasone (FLONASE) 50 MCG/ACT nasal spray    . furosemide (LASIX) 20 MG tablet TAKE 1 TABLET EVERY TUESDAY AND THURSDAY IN THE MORNING   . lisinopril (ZESTRIL) 20 MG tablet Take 1 tablet (20 mg total)  by mouth daily.   . metoprolol succinate (TOPROL-XL) 25 MG 24 hr tablet TAKE 1 TABLET BY MOUTH EVERY DAY   . Multiple Vitamin (MULTIVITAMIN WITH MINERALS) TABS tablet Take 1 tablet by mouth daily.   . pantoprazole (PROTONIX) 40 MG tablet Take 1 tablet (40 mg total) by mouth daily.   Marland Kitchen Spacer/Aero-Holding Chambers (AEROCHAMBER W/FLOWSIGNAL) inhaler Inhale into the lungs.   . SPIRIVA HANDIHALER 18 MCG inhalation capsule Place 1 capsule (18 mcg total) into inhaler and inhale daily.   . tamsulosin (FLOMAX) 0.4 MG CAPS capsule TAKE 1 CAPSULE BY MOUTH EVERY DAY   . vitamin C (ASCORBIC ACID) 250 MG tablet Take 250 mg by mouth 2 (two) times daily.    No facility-administered encounter medications on file as of 04/18/2019.     Goals Addressed            This Visit's Progress   . I need a portable 02 concentrator and I would like palliative care (pt-stated)       Current Barriers:  . Chronic Disease Management support and education needs related to COPD and Heart Disease  . No Advanced Directives in place . Noted advanced COPD with need of Palliative support . Needs for re-qualifying for 02 and patient requests for a lighter portable 02 related to weakness.   Nurse Case Manager Clinical Goal(s):  Marland Kitchen Over the next 90 days, patient will work with [facility]  to address needs related to multiple Chronic disease processes   Interventions:  . Called billing  dept at [facility] [phone], representative stating she has received the notes from 10/7 showing patient was 87% on 2.5lpm on the walk test but since there was not a room air test they can not re-qualify patient. Testing is specific to needing a resting room air sat of less than 88% or the complete 6 min walk test which patient was unable to complete. . Placed a call to [place] [phone] to request assistance with obtaining these numbers at upcoming appointment 1/12. Left VM requesting a return call. . Discussed plans with  patient for ongoing care management follow up and provided patient with direct contact information for care management team . Also requested [facility] Clinical staff to fax all necessary paperwork to [facility] Pulmonology [phone].  Patient Self Care Activities:  . Attends all scheduled provider appointments . Unable to perform IADLs independently related to weakness, does not drive.   Please see past updates related to this goal by clicking on the "Past Updates" button in the selected goal          The patient has been provided with contact information for the care management team and has been advised to call with any health related questions or concerns.    Merlene Morse Xochilth Standish RN, BSN Nurse Case Editor, commissioning Family Practice/[facility] Care Management  [phone]) Business Mobile

## 2019-04-22 DIAGNOSIS — Z7982 Long term (current) use of aspirin: Secondary | ICD-10-CM | POA: Diagnosis not present

## 2019-04-22 DIAGNOSIS — J449 Chronic obstructive pulmonary disease, unspecified: Secondary | ICD-10-CM | POA: Diagnosis not present

## 2019-04-22 DIAGNOSIS — R0600 Dyspnea, unspecified: Secondary | ICD-10-CM | POA: Diagnosis not present

## 2019-04-22 DIAGNOSIS — Z79899 Other long term (current) drug therapy: Secondary | ICD-10-CM | POA: Diagnosis not present

## 2019-04-22 DIAGNOSIS — I10 Essential (primary) hypertension: Secondary | ICD-10-CM | POA: Diagnosis not present

## 2019-04-22 DIAGNOSIS — R911 Solitary pulmonary nodule: Secondary | ICD-10-CM | POA: Diagnosis not present

## 2019-05-01 DIAGNOSIS — J449 Chronic obstructive pulmonary disease, unspecified: Secondary | ICD-10-CM | POA: Diagnosis not present

## 2019-05-01 DIAGNOSIS — J9622 Acute and chronic respiratory failure with hypercapnia: Secondary | ICD-10-CM | POA: Diagnosis not present

## 2019-05-06 ENCOUNTER — Ambulatory Visit: Payer: Medicare HMO | Admitting: Nurse Practitioner

## 2019-05-07 ENCOUNTER — Ambulatory Visit: Payer: Medicare HMO | Admitting: Nurse Practitioner

## 2019-05-08 ENCOUNTER — Ambulatory Visit (INDEPENDENT_AMBULATORY_CARE_PROVIDER_SITE_OTHER): Payer: Medicare HMO

## 2019-05-08 VITALS — Ht 71.0 in | Wt 113.0 lb

## 2019-05-08 DIAGNOSIS — Z0000 Encounter for general adult medical examination without abnormal findings: Secondary | ICD-10-CM | POA: Diagnosis not present

## 2019-05-08 NOTE — Patient Instructions (Signed)
Patrick Ayala , Thank you for taking time to come for your Medicare Wellness Visit. I appreciate your ongoing commitment to your health goals. Please review the following plan we discussed and let me know if I can assist you in the future.   Screening recommendations/referrals: Colonoscopy: no longer required Recommended yearly ophthalmology/optometry visit for glaucoma screening and checkup Recommended yearly dental visit for hygiene and checkup  Vaccinations: Influenza vaccine: declined  Pneumococcal vaccine: declined  Tdap vaccine: declined  Shingles vaccine: declined Covid-19 vaccine: We are recommending the vaccine to everyone who has not had an allergic reaction to any of the components of the vaccine. If you have specific questions about the vaccine, please bring them up with your health care provider to discuss them.   We will likely not be getting the vaccine in the office for the first rounds of vaccinations. The way they are releasing the vaccines is going to be through the health systems (like [facility], [facility], [facility], [facility]) or through your county health department.   The [facility] Department is giving vaccines to those 75+ starting 04/16/19  M-F 7AM to 4PM [place] [employer] [place] [address] [place], [place] in a drive through tent  If you are 65+ you can get a vaccine through [facility] by signing up for an appointment.  You can sign up by going to: [email or web address].  You can get more information by going to: [email or web address]    Advanced directives: declined information   Conditions/risks identified: discussed transportation assistance, declined at this time but please call if you decide you need assistance  Next appointment: Follow up in one year for your annual wellness visit   Preventive Care 65 Years and Older, Male Preventive care refers to lifestyle choices and visits with your health care provider that can  promote health and wellness. What does preventive care include?  A yearly physical exam. This is also called an annual well check.  Dental exams once or twice a year.  Routine eye exams. Ask your health care provider how often you should have your eyes checked.  Personal lifestyle choices, including:  Daily care of your teeth and gums.  Regular physical activity.  Eating a healthy diet.  Avoiding tobacco and drug use.  Limiting alcohol use.  Practicing safe sex.  Taking low doses of aspirin every day.  Taking vitamin and mineral supplements as recommended by your health care provider. What happens during an annual well check? The services and screenings done by your health care provider during your annual well check will depend on your age, overall health, lifestyle risk factors, and family history of disease. Counseling  Your health care provider may ask you questions about your:  Alcohol use.  Tobacco use.  Drug use.  Emotional well-being.  Home and relationship well-being.  Sexual activity.  Eating habits.  History of falls.  Memory and ability to understand (cognition).  Work and work Statistician. Screening  You may have the following tests or measurements:  Height, weight, and BMI.  Blood pressure.  Lipid and cholesterol levels. These may be checked every 5 years, or more frequently if you are over 62 years old.  Skin check.  Lung cancer screening. You may have this screening every year starting at age 28 if you have a 30-pack-year history of smoking and currently smoke or have quit within the past 15 years.  Fecal occult blood test (FOBT) of the stool. You may have this test every year starting  at age 80.  Flexible sigmoidoscopy or colonoscopy. You may have a sigmoidoscopy every 5 years or a colonoscopy every 10 years starting at age 52.  Prostate cancer screening. Recommendations will vary depending on your family history and other  risks.  Hepatitis C blood test.  Hepatitis B blood test.  Sexually transmitted disease (STD) testing.  Diabetes screening. This is done by checking your blood sugar (glucose) after you have not eaten for a while (fasting). You may have this done every 1-3 years.  Abdominal aortic aneurysm (AAA) screening. You may need this if you are a current or former smoker.  Osteoporosis. You may be screened starting at age 48 if you are at high risk. Talk with your health care provider about your test results, treatment options, and if necessary, the need for more tests. Vaccines  Your health care provider may recommend certain vaccines, such as:  Influenza vaccine. This is recommended every year.  Tetanus, diphtheria, and acellular pertussis (Tdap, Td) vaccine. You may need a Td booster every 10 years.  Zoster vaccine. You may need this after age 45.  Pneumococcal 13-valent conjugate (PCV13) vaccine. One dose is recommended after age 30.  Pneumococcal polysaccharide (PPSV23) vaccine. One dose is recommended after age 97. Talk to your health care provider about which screenings and vaccines you need and how often you need them. This information is not intended to replace advice given to you by your health care provider. Make sure you discuss any questions you have with your health care provider. Document Released: 04/23/2015 Document Revised: 12/15/2015 Document Reviewed: 01/26/2015 Elsevier Interactive Patient Education  2017 [place] Prevention in the Home Falls can cause injuries. They can happen to people of all ages. There are many things you can do to make your home safe and to help prevent falls. What can I do on the outside of my home?  Regularly fix the edges of walkways and driveways and fix any cracks.  Remove anything that might make you trip as you walk through a door, such as a raised step or threshold.  Trim any bushes or trees on the path to your home.  Use  bright outdoor lighting.  Clear any walking paths of anything that might make someone trip, such as rocks or tools.  Regularly check to see if handrails are loose or broken. Make sure that both sides of any steps have handrails.  Any raised decks and porches should have guardrails on the edges.  Have any leaves, snow, or ice cleared regularly.  Use sand or salt on walking paths during winter.  Clean up any spills in your garage right away. This includes oil or grease spills. What can I do in the bathroom?  Use night lights.  Install grab bars by the toilet and in the tub and shower. Do not use towel bars as grab bars.  Use non-skid mats or decals in the tub or shower.  If you need to sit down in the shower, use a plastic, non-slip stool.  Keep the floor dry. Clean up any water that spills on the floor as soon as it happens.  Remove soap buildup in the tub or shower regularly.  Attach bath mats securely with double-sided non-slip rug tape.  Do not have throw rugs and other things on the floor that can make you trip. What can I do in the bedroom?  Use night lights.  Make sure that you have a light by your bed that is easy  to reach.  Do not use any sheets or blankets that are too big for your bed. They should not hang down onto the floor.  Have a firm chair that has side arms. You can use this for support while you get dressed.  Do not have throw rugs and other things on the floor that can make you trip. What can I do in the kitchen?  Clean up any spills right away.  Avoid walking on wet floors.  Keep items that you use a lot in easy-to-reach places.  If you need to reach something above you, use a strong step stool that has a grab bar.  Keep electrical cords out of the way.  Do not use floor polish or wax that makes floors slippery. If you must use wax, use non-skid floor wax.  Do not have throw rugs and other things on the floor that can make you trip. What can  I do with my stairs?  Do not leave any items on the stairs.  Make sure that there are handrails on both sides of the stairs and use them. Fix handrails that are broken or loose. Make sure that handrails are as long as the stairways.  Check any carpeting to make sure that it is firmly attached to the stairs. Fix any carpet that is loose or worn.  Avoid having throw rugs at the top or bottom of the stairs. If you do have throw rugs, attach them to the floor with carpet tape.  Make sure that you have a light switch at the top of the stairs and the bottom of the stairs. If you do not have them, ask someone to add them for you. What else can I do to help prevent falls?  Wear shoes that:  Do not have high heels.  Have rubber bottoms.  Are comfortable and fit you well.  Are closed at the toe. Do not wear sandals.  If you use a stepladder:  Make sure that it is fully opened. Do not climb a closed stepladder.  Make sure that both sides of the stepladder are locked into place.  Ask someone to hold it for you, if possible.  Clearly mark and make sure that you can see:  Any grab bars or handrails.  First and last steps.  Where the edge of each step is.  Use tools that help you move around (mobility aids) if they are needed. These include:  Canes.  Walkers.  Scooters.  Crutches.  Turn on the lights when you go into a dark area. Replace any light bulbs as soon as they burn out.  Set up your furniture so you have a clear path. Avoid moving your furniture around.  If any of your floors are uneven, fix them.  If there are any pets around you, be aware of where they are.  Review your medicines with your doctor. Some medicines can make you feel dizzy. This can increase your chance of falling. Ask your doctor what other things that you can do to help prevent falls. This information is not intended to replace advice given to you by your health care provider. Make sure you  discuss any questions you have with your health care provider. Document Released: 01/21/2009 Document Revised: 09/02/2015 Document Reviewed: 05/01/2014 Elsevier Interactive Patient Education  2017 [employer].

## 2019-05-08 NOTE — Progress Notes (Signed)
Subjective:   Patrick Ayala is a 79 y.o. male who presents for Medicare Annual/Subsequent preventive examination.  This visit is being conducted via phone call  - after an attmept to do on video chat - due to the COVID-19 pandemic. This patient has given me verbal consent via phone to conduct this visit, patient states they are participating from their home address. Some vital signs may be absent or patient reported.   Patient identification: identified by name, DOB, and current address.     Review of Systems:  Cardiac Risk Factors include: advanced age (>6mn, >>41women);male gender;dyslipidemia;hypertension;smoking/ tobacco exposure     Objective:    Vitals: Ht _0  (1.803 m)   Wt 113 lb (51.3 kg)   BMI 15.76 kg/m   Body mass index is 15.76 kg/m.  Advanced Directives 05/08/2019 02/12/2018 11/05/2017 11/04/2017 11/03/2017 11/02/2017 09/21/2017  Does Patient Have a Medical Advance Directive? _1  No No  Would patient like information on creating a medical advance directive? - No - Patient declined No - Patient declined - No - Patient declined Yes (ED - Information included in AVS) No - Patient declined    Tobacco Social History   Tobacco Use  Smoking Status Current Some Day Smoker  . Packs/day: 0.25  . Types: Cigarettes  Smokeless Tobacco Never Used  Tobacco Comment   Smoking History 1(1)Packs per day currently smoking 4-5 cigarettes a day      Ready to quit: Yes Counseling given: Yes Comment: Smoking History 1(1)Packs per day currently smoking 4-5 cigarettes a day    Clinical Intake:  Pre-visit preparation completed: Yes  Pain : No/denies pain     Nutritional Status: BMI <19  Underweight Nutritional Risks: None Diabetes: No  How often do you need to have someone help you when you read instructions, pamphlets, or other written materials from your doctor or pharmacy?: 1 - Never  Interpreter Needed?: No  Information entered by :: Abygail Galeno,LPN  Past Medical History:  Diagnosis Date  . Carboxyhemoglobinemia   . COPD (chronic obstructive pulmonary disease) (HCC)    on 2.5L chronic home o2  . Hypertension   . Polycythemia, secondary 10/20/2014  . Thrombocytopenia ([facility]    Past Surgical History:  Procedure Laterality Date  . APPENDECTOMY    . COLONOSCOPY    . COLONOSCOPY WITH PROPOFOL N/A 11/05/2017   Procedure: COLONOSCOPY WITH PROPOFOL;  Surgeon: VLin Landsman MD;  [facility]  Service: Gastroenterology;  Laterality: N/A;  . ESOPHAGOGASTRODUODENOSCOPY    . ESOPHAGOGASTRODUODENOSCOPY N/A 11/04/2017   Procedure: ESOPHAGOGASTRODUODENOSCOPY (EGD);  Surgeon: VLin Landsman MD;  [facility];  Service: Gastroenterology;  Laterality: N/A;  . EYE SURGERY Right    cataract  . HERNIA REPAIR    . polycythemia     Family History  Problem Relation Age of Onset  . Cancer Mother   . Cancer Sister   . Cancer Brother    Social History   Socioeconomic History  . Marital status: Married    Spouse name: Not on file  . Number of children: Not on file  . Years of education: Not on file  . Highest education level: Not on file  Occupational History  . Occupation: retired  Tobacco Use  . Smoking status: Current Some Day Smoker    Packs/day: 0.25    Types: Cigarettes  . Smokeless tobacco: Never Used  . Tobacco comment: Smoking History 1(1)Packs per day currently smoking 4-5 cigarettes a day  Substance and Sexual Activity  . Alcohol use: Yes    Alcohol/week: 5.0 standard drinks    Types: 5 Cans of beer per week  . Drug use: No  . Sexual activity: Not on file  Other Topics Concern  . Not on file  Social History Narrative   Lives at home with wife, independent at baseline   Social Determinants of Health   Financial Resource Strain:   . Difficulty of Paying Living Expenses: Not on file  Food Insecurity:   . Worried About Charity fundraiser in the Last Year: Not on file  . Ran  Out of Food in the Last Year: Not on file  Transportation Needs:   . Lack of Transportation (Medical): Not on file  . Lack of Transportation (Non-Medical): Not on file  Physical Activity:   . Days of Exercise per Week: Not on file  . Minutes of Exercise per Session: Not on file  Stress:   . Feeling of Stress : Not on file  Social Connections:   . Frequency of Communication with Friends and Family: Not on file  . Frequency of Social Gatherings with Friends and Family: Not on file  . Attends Religious Services: Not on file  . Active Member of Clubs or Organizations: Not on file  . Attends Archivist Meetings: Not on file  . Marital Status: Not on file    Outpatient Encounter Medications as of 05/08/2019  Medication Sig  . acetaminophen (TYLENOL) 325 MG tablet Take 2 tablets (650 mg total) by mouth every 6 (six) hours as needed for mild pain (or Fever >/= 101).  Marland Kitchen albuterol (VENTOLIN HFA) 108 (90 Base) MCG/ACT inhaler Inhale 1-2 puffs into the lungs every 6 (six) hours as needed for wheezing or shortness of breath.  Marland Kitchen amLODipine (NORVASC) 5 MG tablet TAKE 1 TABLET (5 MG TOTAL) BY MOUTH DAILY.  Marland Kitchen aspirin 81 MG chewable tablet Chew by mouth.  Marland Kitchen atorvastatin (LIPITOR) 20 MG tablet TAKE 1 TABLET EVERY DAY FOR HYPERCHOLESTEROLEMIA  . budesonide-formoterol (SYMBICORT) 160-4.5 MCG/ACT inhaler Inhale 2 puffs into the lungs every morning. INHALE 2 TIMES DAILY  . fluticasone (FLONASE) 50 MCG/ACT nasal spray   . furosemide (LASIX) 20 MG tablet TAKE 1 TABLET EVERY TUESDAY AND THURSDAY IN THE MORNING  . lisinopril (ZESTRIL) 20 MG tablet Take 1 tablet (20 mg total) by mouth daily.  . metoprolol succinate (TOPROL-XL) 25 MG 24 hr tablet TAKE 1 TABLET BY MOUTH EVERY DAY  . Multiple Vitamin (MULTIVITAMIN WITH MINERALS) TABS tablet Take 1 tablet by mouth daily.  . pantoprazole (PROTONIX) 40 MG tablet Take 1 tablet (40 mg total) by mouth daily.  Marland Kitchen Spacer/Aero-Holding Chambers (AEROCHAMBER  W/FLOWSIGNAL) inhaler Inhale into the lungs.  . SPIRIVA HANDIHALER 18 MCG inhalation capsule Place 1 capsule (18 mcg total) into inhaler and inhale daily.  . tamsulosin (FLOMAX) 0.4 MG CAPS capsule TAKE 1 CAPSULE BY MOUTH EVERY DAY  . vitamin C (ASCORBIC ACID) 250 MG tablet Take 250 mg by mouth 2 (two) times daily.  . [DISCONTINUED] feeding supplement, ENSURE ENLIVE, (ENSURE ENLIVE) LIQD Take 237 mLs by mouth 3 (three) times daily between meals. (Patient not taking: Reported on 05/08/2019)   No facility-administered encounter medications on file as of 05/08/2019.    Activities of Daily Living In your present state of health, do you have any difficulty performing the following activities: 05/08/2019  Hearing? N  Comment no hearing aids  Vision? Y  Comment some with right eye, no eyeglasses,  [facility]  Difficulty concentrating or making decisions? Y  Comment sometimes comes back  Walking or climbing stairs? N  Dressing or bathing? N  Doing errands, shopping? N  Comment family helps  Preparing Food and eating ? N  Using the Toilet? N  In the past six months, have you accidently leaked urine? N  Do you have problems with loss of bowel control? N  Managing your Medications? N  Managing your Finances? N  Housekeeping or managing your Housekeeping? N  Some recent data might be hidden    Patient Care Team: Venita Lick, NP as PCP - General (Nurse Practitioner) Despina Hick, MD as Consulting Physician (Ophthalmology) Minor, Dalbert Garnet, RN (Inactive) as Case Manager Jason Coop, NP as Nurse Practitioner Renaldo Harrison, DO as Referring Physician (Pulmonary Disease)   Assessment:   This is a routine wellness examination for [facility].  Exercise Activities and Dietary recommendations Current Exercise Habits: The patient does not participate in regular exercise at present, Exercise limited by: None identified  Goals    . I need a portable 02 concentrator and I  would like palliative care (pt-stated)     Current Barriers:  . Chronic Disease Management support and education needs related to COPD and Heart Disease  . No Advanced Directives in place . Noted advanced COPD with need of Palliative support . Needs for re-qualifying for 02 and patient requests for a lighter portable 02 related to weakness.   Nurse Case Manager Clinical Goal(s):  Marland Kitchen Over the next 90 days, patient will work with [facility]  to address needs related to multiple Chronic disease processes   Interventions:  . Called billing dept at [facility]  [phone], representative stating she has received the notes from 10/7 showing patient was 87% on 2.5lpm on the walk test but since there was not a room air test they can not re-qualify patient. Testing is specific to needing a resting room air sat of less than 88% or the complete 6 min walk test which patient was unable to complete. . Placed a call to [facility] [phone] to request assistance with obtaining these numbers at upcoming appointment 1/12. Left VM requesting a return call. . Discussed plans with patient for ongoing care management follow up and provided patient with direct contact information for care management team . Also requested [facility] Clinical staff to fax all necessary paperwork to [facility] [phone].  Patient Self Care Activities:  . Attends all scheduled provider appointments . Unable to perform IADLs independently related to weakness, does not drive.   Please see past updates related to this goal by clicking on the "Past Updates" button in the selected goal         Fall Risk: Fall Risk  05/08/2019 03/18/2018 09/21/2017 09/19/2016 06/28/2015  Falls in the past year? 0 0 No No No  Number falls in past yr: 0 - - - -  Injury with Fall? 0 - - - -  Follow up - Falls evaluation completed - - -    FALL RISK PREVENTION PERTAINING TO THE HOME:  Any stairs in or around the home? No  If so, are  there any without handrails? No   Home free of loose throw rugs in walkways, pet beds, electrical cords, etc? No  Adequate lighting in your home to reduce risk of falls? No   ASSISTIVE DEVICES UTILIZED TO PREVENT FALLS:  Life alert? No  Use of a cane, walker or w/c? No  Grab bars in the bathroom? No  Shower chair or bench in shower? No  Elevated toilet seat or a handicapped toilet? No   TIMED UP AND GO:  Unable to perform  Depression Screen PHQ 2/9 Scores 05/08/2019 03/18/2018 09/21/2017 04/04/2017  PHQ - 2 Score 0 - 6 0  PHQ- 9 Score - - 20 -  Exception Documentation - Patient refusal - -    Cognitive Function        Immunization History  Administered Date(s) Administered  . Pneumococcal-Unspecified 02/21/2007  . Td 02/21/2007    Qualifies for Shingles Vaccine? Yes  Zostavax completed n/a. Due for Shingrix. Education has been provided regarding the importance of this vaccine. Pt has been advised to call insurance company to determine out of pocket expense. Advised may also receive vaccine at local pharmacy or Health Dept. Verbalized acceptance and understanding.  Tdap: Discussed need for TD/TDAP vaccine, patient verbalized understanding that this is not covered as a preventative with there insurance and to call the office if he develops any new skin injuries, ie: cuts, scrapes, bug bites, or open wounds.  Flu Vaccine: declined.   Pneumococcal Vaccine: due now. Declined.   Screening Tests Health Maintenance  Topic Date Due  . INFLUENZA VACCINE  07/09/2019 (Originally 11/09/2018)  . TETANUS/TDAP  05/07/2020 (Originally 02/20/2017)  . PNA vac Low Risk Adult (1 of 2 - PCV13) 05/07/2020 (Originally 02/21/2008)   Cancer Screenings:  Colorectal Screening: not indicated   Lung Cancer Screening: (Low Dose CT Chest recommended if Age 29-80 years, 30 pack-year currently smoking OR have quit w/in 15years.) does qualify.   Lung Cancer Screening Referral: An Epic message has  been sent to Burgess Estelle, RN (Oncology Nurse Navigator) regarding the possible need for this exam. Raquel Sarna will review the patient's chart to determine if the patient truly qualifies for the exam. If the patient qualifies, Raquel Sarna will order the Low Dose CT of the chest to facilitate the scheduling of this exam.  Additional Screening:  Hepatitis C Screening: does not qualify  Vision Screening: Recommended annual ophthalmology exams for early detection of glaucoma and other disorders of the eye. Is the patient up to date with their annual eye exam?  Yes  Who is the provider or what is the name of the office in which the pt attends annual eye exams? [facility]   Dental Screening: Recommended annual dental exams for proper oral hygiene  Community Resource Referral:  CRR required this visit?  No   Discussed transportation assistance, declined at this time, but will call if he needs assistance in the future.       Plan:  I have personally reviewed and addressed the Medicare Annual Wellness questionnaire and have noted the following in the patient's chart:  A. Medical and social history B. Use of alcohol, tobacco or illicit drugs  C. Current medications and supplements D. Functional ability and status E.  Nutritional status F.  Physical activity G. Advance directives H. List of other physicians I.  Hospitalizations, surgeries, and ER visits in previous 12 months J.  [place] such as hearing and vision if needed, cognitive and depression L. Referrals and appointments   In addition, I have reviewed and discussed with patient certain preventive protocols, quality metrics, and best practice recommendations. A written personalized care plan for preventive services as well as general preventive health recommendations were provided to patient.   Signed,   Bevelyn Ngo, LPN  1/60/7371 Nurse Health Advisor   Nurse  Notes: tremors aren't any better, wants to discuss at  his appt with Jolene on 05/13/2019

## 2019-05-12 ENCOUNTER — Telehealth: Payer: Self-pay | Admitting: Primary Care

## 2019-05-12 NOTE — Telephone Encounter (Signed)
T/c to family to request a follow up meeting. There have been several attempts to schedule.Message left.

## 2019-05-13 ENCOUNTER — Other Ambulatory Visit: Payer: Self-pay

## 2019-05-13 ENCOUNTER — Ambulatory Visit (INDEPENDENT_AMBULATORY_CARE_PROVIDER_SITE_OTHER): Payer: Medicare HMO | Admitting: Pharmacist

## 2019-05-13 ENCOUNTER — Ambulatory Visit (INDEPENDENT_AMBULATORY_CARE_PROVIDER_SITE_OTHER): Payer: Medicare HMO | Admitting: Nurse Practitioner

## 2019-05-13 ENCOUNTER — Encounter: Payer: Self-pay | Admitting: Nurse Practitioner

## 2019-05-13 VITALS — BP 116/61 | HR 84 | Temp 98.9°F

## 2019-05-13 DIAGNOSIS — I10 Essential (primary) hypertension: Secondary | ICD-10-CM

## 2019-05-13 DIAGNOSIS — E785 Hyperlipidemia, unspecified: Secondary | ICD-10-CM

## 2019-05-13 DIAGNOSIS — I2781 Cor pulmonale (chronic): Secondary | ICD-10-CM

## 2019-05-13 DIAGNOSIS — J449 Chronic obstructive pulmonary disease, unspecified: Secondary | ICD-10-CM | POA: Diagnosis not present

## 2019-05-13 DIAGNOSIS — R64 Cachexia: Secondary | ICD-10-CM

## 2019-05-13 DIAGNOSIS — I270 Primary pulmonary hypertension: Secondary | ICD-10-CM | POA: Diagnosis not present

## 2019-05-13 MED ORDER — BUDESONIDE-FORMOTEROL FUMARATE 160-4.5 MCG/ACT IN AERO: 2.0000 | INHALATION_SPRAY | Freq: Every morning | RESPIRATORY_TRACT | 3 refills | Status: DC

## 2019-05-13 MED ORDER — ALBUTEROL SULFATE HFA 108 (90 BASE) MCG/ACT IN AERS: 1.0000 | INHALATION_SPRAY | Freq: Four times a day (QID) | RESPIRATORY_TRACT | 6 refills | Status: AC | PRN

## 2019-05-13 MED ORDER — TRELEGY ELLIPTA 100-62.5-25 MCG/INH IN AEPB: 1.0000 | INHALATION_SPRAY | Freq: Every day | RESPIRATORY_TRACT | 3 refills | Status: DC

## 2019-05-13 NOTE — Patient Instructions (Signed)
Eating Plan for Chronic Obstructive Pulmonary Disease Chronic obstructive pulmonary disease (COPD) causes symptoms such as shortness of breath, coughing, and chest discomfort. These symptoms can make it difficult to eat enough to maintain a healthy weight. Generally, people with COPD should eat a diet that is high in calories, protein, and other nutrients to maintain body weight and to keep the lungs as healthy as possible. Depending on the medicines you take and other health conditions you may have, your health care provider may give you additional recommendations on what to eat or avoid. Talk with your health care provider about your goals for body weight, and work with a dietitian to develop an eating plan that is right for you. What are tips for following this plan? Reading food labels   Avoid foods with more than 300 milligrams (mg) of salt (sodium) per serving.  Choose foods that contain at least 4 grams (g) of fiber per serving. Try to eat 20-30 g of fiber each day.  Choose foods that are high in calories and protein, such as nuts, beans, yogurt, and cheese. Shopping  Do not buy foods labeled as diet, low-calorie, or low-fat.  If you are able to eat dairy products: ? Avoid low-fat or skim milk. ? Buy dairy products that have at least 2% fat.  Buy nutritional supplement drinks.  Buy grains and prepared foods labeled as enriched or fortified.  Consider buying low-sodium, pre-made foods to conserve energy for eating. Cooking  Add dry milk or protein powder to smoothies.  Cook with healthy fats, such as olive oil, canola oil, sunflower oil, and grapeseed oil.  Add oil, butter, cream cheese, or nut butters to foods to increase fat and calories.  To make foods easier to chew and swallow: ? Cook vegetables, pasta, and rice until soft. ? Cut or grind meat into very small pieces. ? Dip breads in liquid. Meal planning   Eat when you feel hungry.  Eat 5-6 small meals throughout  the day.  Drink 6-8 glasses of water each day.  Do not drink liquids with meals. Drink liquids at the end of the meal to avoid feeling full too quickly.  Eat a variety of fruits and vegetables every day.  Ask for assistance from family or friends with planning and preparing meals as needed.  Avoid foods that cause you to feel bloated, such as carbonated drinks, fried foods, beans, broccoli, cabbage, and apples.  For older adults, ask your local agency on aging whether you are eligible for meal assistance programs, such as Meals on Wheels. Lifestyle   Do not smoke.  Eat slowly. Take small bites and chew food well before swallowing.  Do not overeat. This may make it more difficult to breathe after eating.  Sit up while eating.  If needed, continue to use supplemental oxygen while eating.  Rest or relax for 30 minutes before and after eating.  Monitor your weight as told by your health care provider.  Exercise as told by your health care provider. What foods can I eat? Fruits All fresh, dried, canned, or frozen fruits that do not cause gas. Vegetables All fresh, canned (no salt added), or frozen vegetables that do not cause gas. Grains Whole grain bread. Enriched whole grain pasta. Fortified whole grain cereals. Fortified rice. Quinoa. Meats and other proteins Lean meat. Poultry. Fish. Dried beans. Unsalted nuts. Tofu. Eggs. Nut butters. Dairy Whole or 2% milk. Cheese. Yogurt. Fats and oils Olive oil. Canola oil. Butter. Margarine. Beverages Water. Vegetable  juice (no salt added). Decaffeinated coffee. Decaffeinated or herbal tea. Seasonings and condiments Fresh or dried herbs. Low-salt or salt-free seasonings. Low-sodium soy sauce. The items listed above may not be a complete list of foods and beverages you can eat. Contact a dietitian for more information. What foods are not recommended? Fruits Fruits that cause gas, such as apples or melon. Vegetables Vegetables  that cause gas, such as broccoli, Brussels sprouts, cabbage, cauliflower, and onions. Canned vegetables with added salt. Meats and other proteins Fried meat. Salt-cured meat. Processed meat. Dairy Fat-free or low-fat milk, yogurt, or cheese. Processed cheese. Beverages Carbonated drinks. Caffeinated drinks, such as coffee, tea, and soft drinks. Juice. Alcohol. Vegetable juice with added salt. Seasonings and condiments Salt. Seasoning mixes with salt. Soy sauce. Angie Fava. Other foods Clear soup or broth. Fried foods. Prepared frozen meals. The items listed above may not be a complete list of foods and beverages you should avoid. Contact a dietitian for more information. Summary  COPD symptoms can make it difficult to eat enough to maintain a healthy weight.  A COPD eating plan can help you maintain your body weight and keep your lungs as healthy as possible.  Eat a diet that is high in calories, protein, and other nutrients. Read labels to make sure that you are getting the right nutrients. Cook foods to make them easy to chew and swallow.  Eat 5-6 small meals throughout the day, and avoid foods that cause gas or make you feel bloated. This information is not intended to replace advice given to you by your health care provider. Make sure you discuss any questions you have with your health care provider. Document Revised: 07/18/2018 Document Reviewed: 06/12/2017 Elsevier Patient Education  [place].

## 2019-05-13 NOTE — Progress Notes (Addendum)
BP 116/61   Pulse 84   Temp 98.9 F (37.2 C) (Oral)   SpO2 92%    Subjective:    Patient ID: Patrick Ayala, male    DOB: [birth year], 79 y.o.   MRN: [id number]  HPI: DAMEAN Patrick Ayala is a 79 y.o. male  Chief Complaint  Patient presents with  . COPD  . Hypertension   COPD Followed by [facility] saw them last via telemedicine 04/22/2019.  Appears to have CT scan scheduled for tomorrow.  Has been O2 dependent for a long time due to Cor Pulmonale/Pulmonary hypertension and severe COPD, over 3 years per patient.  Is dependent on O2 wears morning, noon, night.  Patient will require ongoing continuous O2 use do to his severe COPD and Cor Pulmonale. There are no alternative treatments at this point due to severity of his COPD and pulmonary hypertension.  He continues on inhalers daily and continuous O2.  At baseline is SOB walking from room to room in the house, does not need to stop to take breath, but does need to catch breath once enters new room Currently smokes about 1/2 PPD, sometimes more and sometimes less.  Is trying to quit on his own and has patches he intends on starting.  His wife and him report difficulty obtaining Symbicort as he often finishes 3 months supply some days prior to refill due date and then can not obtain refills.  Denies using more that ordered. COPD status: stable Satisfied with current treatment?: yes Oxygen use: yes -- 2 to 3 L Dyspnea frequency: baseline Cough frequency: baseline Rescue inhaler frequency:  Does not have any, ran out Limitation of activity: yes Productive cough: none Last Spirometry: with pulmonary Pneumovax: refused Influenza: refused   HYPERTENSION / HYPERLIPIDEMIA Continues on Lisinopril, Amlodipine, Metoprolol, Lasix, and Lipitor + ASA.  Palliative visit, initial one, on Monday next week. Satisfied with current treatment? yes Duration of hypertension: chronic BP monitoring frequency: not checking BP range:  BP medication side  effects: no Duration of hyperlipidemia: chronic Cholesterol medication side effects: no Cholesterol supplements: none Medication compliance: good compliance Aspirin: no Recent stressors: no Recurrent headaches: no Visual changes: no Palpitations: no Dyspnea: at baseline Chest pain: no Lower extremity edema: no Dizzy/lightheaded: no  Relevant past medical, surgical, family and social history reviewed and updated as indicated. Interim medical history since our last visit reviewed. Allergies and medications reviewed and updated.  Review of Systems  Constitutional: Negative for activity change, diaphoresis, fatigue and fever.  Respiratory: Positive for cough (intermittent, baseline), shortness of breath (intermittent, baseline) and wheezing (intermittent, baseline). Negative for chest tightness.   Cardiovascular: Negative for chest pain, palpitations and leg swelling.  Gastrointestinal: Negative for abdominal distention, abdominal pain, constipation, diarrhea, nausea and vomiting.  Endocrine: Negative for cold intolerance, heat intolerance, polydipsia, polyphagia and polyuria.  Neurological: Negative for dizziness, syncope, weakness, light-headedness, numbness and headaches.  Psychiatric/Behavioral: Negative.     Per HPI unless specifically indicated above     Objective:    BP 116/61   Pulse 84   Temp 98.9 F (37.2 C) (Oral)   SpO2 92%   Wt Readings from Last 3 Encounters:  05/08/19 113 lb (51.3 kg)  01/15/19 113 lb (51.3 kg)  10/30/18 115 lb (52.2 kg)    Physical Exam Vitals and nursing note reviewed.  Constitutional:      General: He is awake. He is not in acute distress.    Appearance: He is well-developed. He is cachectic. He is  not ill-appearing.     Comments: Chronically ill appearing.  HENT:     Head: Normocephalic and atraumatic.     Right Ear: Hearing normal. No drainage.     Left Ear: Hearing normal. No drainage.  Eyes:     General: Lids are normal.         Right eye: No discharge.        Left eye: No discharge.     Conjunctiva/sclera: Conjunctivae normal.     Pupils: Pupils are equal, round, and reactive to light.  Neck:     Vascular: No carotid bruit.  Cardiovascular:     Rate and Rhythm: Normal rate and regular rhythm.     Heart sounds: Normal heart sounds, S1 normal and S2 normal. No murmur. No gallop.   Pulmonary:     Effort: No accessory muscle usage or respiratory distress.     Breath sounds: Decreased breath sounds and wheezing present.     Comments: Decreased breath sounds throughout with intermittent expiratory wheezes.  Clubbing of nails bilateral hands.  O2 by nasal cannula at 3L running. Abdominal:     General: Bowel sounds are normal.     Palpations: Abdomen is soft. There is no hepatomegaly or splenomegaly.  Musculoskeletal:        General: Normal range of motion.     Cervical back: Normal range of motion and neck supple.     Right lower leg: No edema.     Left lower leg: No edema.  Skin:    General: Skin is warm and dry.  Neurological:     Mental Status: He is alert and oriented to person, place, and time.  Psychiatric:        Attention and Perception: Attention normal.        Mood and Affect: Mood normal.        Behavior: Behavior normal. Behavior is cooperative.        Thought Content: Thought content normal.     Results for orders placed or performed in visit on 12/06/18  CBC with Differential/Platelet  Result Value Ref Range   WBC 8.8 3.4 - 10.8 x10E3/uL   RBC 3.94 (L) 4.14 - 5.80 x10E6/uL   Hemoglobin 12.6 (L) 13.0 - 17.7 g/dL   Hematocrit 37.2 (L) 37.5 - 51.0 %   MCV 94 79 - 97 fL   MCH 32.0 26.6 - 33.0 pg   MCHC 33.9 31.5 - 35.7 g/dL   RDW 12.8 11.6 - 15.4 %   Platelets 178 150 - 450 x10E3/uL   Neutrophils 67 Not Estab. %   Lymphs 21 Not Estab. %   Monocytes 9 Not Estab. %   Eos 1 Not Estab. %   Basos 1 Not Estab. %   Neutrophils Absolute 6.0 1.4 - 7.0 x10E3/uL   Lymphocytes Absolute 1.8 0.7 -  3.1 x10E3/uL   Monocytes Absolute 0.8 0.1 - 0.9 x10E3/uL   EOS (ABSOLUTE) 0.1 0.0 - 0.4 x10E3/uL   Basophils Absolute 0.1 0.0 - 0.2 x10E3/uL   Immature Granulocytes 1 Not Estab. %   Immature Grans (Abs) 0.0 0.0 - 0.1 x10E3/uL  Comprehensive metabolic panel  Result Value Ref Range   Glucose 98 65 - 99 mg/dL   BUN 16 8 - 27 mg/dL   Creatinine, Ser 0.97 0.76 - 1.27 mg/dL   GFR calc non Af Amer 76 >59 mL/min/1.73   GFR calc Af Amer 87 >59 mL/min/1.73   BUN/Creatinine Ratio 16 10 - 24   Sodium 143  134 - 144 mmol/L   Potassium 4.5 3.5 - 5.2 mmol/L   Chloride 97 96 - 106 mmol/L   CO2 36 (H) 20 - 29 mmol/L   Calcium 9.1 8.6 - 10.2 mg/dL   Total Protein 6.6 6.0 - 8.5 g/dL   Albumin 4.3 3.7 - 4.7 g/dL   Globulin, Total 2.3 1.5 - 4.5 g/dL   Albumin/Globulin Ratio 1.9 1.2 - 2.2   Bilirubin Total 0.5 0.0 - 1.2 mg/dL   Alkaline Phosphatase 54 39 - 117 IU/L   AST 23 0 - 40 IU/L   ALT 22 0 - 44 IU/L  Lipid Panel w/o Chol/HDL Ratio  Result Value Ref Range   Cholesterol, Total 142 100 - 199 mg/dL   Triglycerides 91 0 - 149 mg/dL   HDL 72 >39 mg/dL   VLDL Cholesterol Cal 18 5 - 40 mg/dL   LDL Calculated 52 0 - 99 mg/dL      Assessment & Plan:   Problem List Items Addressed This Visit      Cardiovascular and Mediastinum   Essential hypertension    Chronic, ongoing.  Continue current medication regimen and adjust as needed.  Recommend he check his BP at least 3 times a week and document.  Return to office in 6 months and will obtain labs if patient agrees.      Pulmonary hypertension, primary ([place])    Chronic, severe, dependent on continuous O2.  Continue to collaborate with CCM and palliative team.  Continue current medication regimen and collaboration with pulmonary.      Cor pulmonale ([facility])    Chronic, severe, dependent on continuous O2.   Continue current medication regimen and collaboration with pulmonary, CCM team, and palliative.        Respiratory   COPD, severe ([place]) -  Primary    Chronic, ongoing.  Collaborate with CCM team, will change from Symbicort to Trelegy which will allow medication minimization and may assist with symptoms.  Continue remainder of medication regimen and collaboration with pulmonary at [facility].  Discussed with his wife and him need to follow-up with them to obtain testing required to attain new portable O2.  Continue collaboration with palliative.  Will send in script for nebulizer machine and Albuterol nebs, which would benefit patient at this stage of COPD.  Return in 6 months or sooner if worsening.      Relevant Medications   albuterol (VENTOLIN HFA) 108 (90 Base) MCG/ACT inhaler   Fluticasone-Umeclidin-Vilant (TRELEGY ELLIPTA) 100-62.5-25 MCG/INH AEPB     Other   Hyperlipidemia    Chronic, ongoing.  Continue current medication regimen and adjust as needed.  Will obtain labs next visit if patient agreeable.         Cachexia ([place])    Recommend continuation of supplements, focus on placing them in freezer and taking as ice cream vs shake, which he may prefer.          Follow up plan: Return in about 6 months (around 11/10/2019) for HTN/HLD, COPD.

## 2019-05-13 NOTE — Assessment & Plan Note (Signed)
Chronic, ongoing.  Continue current medication regimen and adjust as needed.  Will obtain labs next visit if patient agreeable.

## 2019-05-13 NOTE — Assessment & Plan Note (Signed)
Recommend continuation of supplements, focus on placing them in freezer and taking as ice cream vs shake, which he may prefer.

## 2019-05-13 NOTE — Assessment & Plan Note (Signed)
Chronic, severe, dependent on continuous O2.  Continue to collaborate with CCM and palliative team.  Continue current medication regimen and collaboration with pulmonary.

## 2019-05-13 NOTE — Assessment & Plan Note (Signed)
Chronic, severe, dependent on continuous O2.   Continue current medication regimen and collaboration with pulmonary, CCM team, and palliative.

## 2019-05-13 NOTE — Assessment & Plan Note (Addendum)
Chronic, ongoing.  Collaborate with CCM team, will change from Symbicort to Trelegy which will allow medication minimization and may assist with symptoms.  Continue remainder of medication regimen and collaboration with pulmonary at [facility].  Discussed with his wife and him need to follow-up with them to obtain testing required to attain new portable O2.  Continue collaboration with palliative.  Will send in script for nebulizer machine and Albuterol nebs, which would benefit patient at this stage of COPD.  Return in 6 months or sooner if worsening.

## 2019-05-13 NOTE — Patient Instructions (Signed)
Visit Information  Goals Addressed            This Visit's Progress     Patient Stated   . PharmD "This medication isn't lasting" (pt-stated)       Current Barriers:  . Polypharmacy; complex patient with multiple comorbidities including severe COPD, tobacco abuse, HTN, atherosclerosis, aortic stenosis, GERD, BPH . Daughter helps manage medications. They live together and rely on each other for financial support. They report that they previously applied for Medicare Extra Help, but were denied d/t daughter's income/resources.  o COPD - Spiriva Handihaler 18 mcg daily, Symbicort 160/4.5 mcg 2 puffs BID, albuterol PRN. They report that Symbicort, though patient is taking 2 puffs twice daily, is not lasting a full 30 days. Having a difficult time affording 2 brand name copays, especially given deductible o HTN/atherosclerosis/aortic stenosis: amlodipine 5 mg daily, ASA 81 mg daily, furosemide 20 mg daily, lisinorpil 20 mg twice weekly, metoprolol succinate 25 mg daily  o GERD: pantoprazole 40 mg daily o BPH: tamsulosin 0.4 mg daily   Pharmacist Clinical Goal(s):  Marland Kitchen Over the next 90 days, patient will work with PharmD and provider towards optimized medication management  Interventions: . Comprehensive medication review performed; medication list updated in electronic medical record . Reviewed inhalation technique. Reviewed Symbicort administration technique. Patient may be accidentally using multiple puffs at once due to coordinating pump and inhalation. Appropriate to consider alternative device. Collaborated w/ Marnee Guarneri, will send prescription for Trelegy to consolidate regimen and allow for once daily administration.  . Patient previously applied for Medicare Extra Help and was denied. They are hesitant to pursue patient assistance. Can evaluate need/ability for Trelegy patient assistance if covered, if they meet $600 total prescription out of pocket. . Daughter is to call me if Trelegy  is not covered.   Patient Self Care Activities:  . Patient will take medications as prescribed  Initial goal documentation        The patient verbalized understanding of instructions provided today and declined a print copy of patient instruction materials.    Plan: - Will collaborate w/ patient, daughter, provider as above - Scheduled f/u call for full medication review 06/18/19  Catie Darnelle Maffucci, PharmD, [place] [phone]

## 2019-05-13 NOTE — Chronic Care Management (AMB) (Signed)
Chronic Care Management   Note  05/13/2019 Name: Patrick Ayala MRN: [id number] DOB: 1940/09/09   Subjective:  Patrick Ayala is a 79 y.o. year old male who is a primary care patient of [name], [name], NP. The CCM team was consulted for assistance with chronic disease management and care coordination needs.    Met with patient and daughter face to face during appointment today for medication affordability/administration concerns.   Review of patient status, including review of consultants reports, laboratory and other test data, was performed as part of comprehensive evaluation and provision of chronic care management services.   Objective:  Lab Results  Component Value Date   CREATININE 0.97 12/06/2018   CREATININE 0.59 (L) 10/01/2018   CREATININE 0.57 (L) 04/26/2018    No results found for: HGBA1C     Component Value Date/Time   CHOL 142 12/06/2018 1606   CHOL 156 03/27/2017 1410   TRIG 91 12/06/2018 1606   TRIG 51 03/27/2017 1410   HDL 72 12/06/2018 1606   CHOLHDL 1.5 03/18/2018 1326   VLDL 10 03/27/2017 1410   LDLCALC 52 12/06/2018 1606    Clinical ASCVD: Yes  The ASCVD Risk score Mikey Bussing DC Jr., et al., 2013) failed to calculate for the following reasons:   The patient has a prior MI or stroke diagnosis    BP Readings from Last 3 Encounters:  05/13/19 116/61  01/15/19 138/63  12/06/18 (!) 152/73    No Known Allergies  Medications Reviewed Today    Reviewed by Georgina Peer, CMA (Certified Medical Assistant) on 05/13/19 at 1548  Med List Status: <None>  Medication Order Taking? Sig Documenting Provider Last Dose Status Informant  albuterol (VENTOLIN HFA) 108 (90 Base) MCG/ACT inhaler [id number] Yes Inhale 1-2 puffs into the lungs every 6 (six) hours as needed for wheezing or shortness of breath. Johnson, Megan P, DO Taking Active   amLODipine (NORVASC) 5 MG tablet [id number] Yes TAKE 1 TABLET (5 MG TOTAL) BY MOUTH DAILY. Marnee Guarneri T, NP Taking Active    aspirin 81 MG chewable tablet [id number] Yes Chew by mouth. [provider] Taking Active   atorvastatin (LIPITOR) 20 MG tablet [id number] Yes TAKE 1 TABLET EVERY DAY FOR HYPERCHOLESTEROLEMIA Crissman, Jeannette How, MD Taking Active   budesonide-formoterol [facility]) 160-4.5 MCG/ACT inhaler [id number] Yes Inhale 2 puffs into the lungs every morning. INHALE 2 TIMES DAILY Johnson, Megan P, DO Taking Active   fluticasone (FLONASE) 50 MCG/ACT nasal spray [id number] Yes  [provider] Taking Active   furosemide (LASIX) 20 MG tablet [id number] Yes TAKE 1 TABLET EVERY TUESDAY AND THURSDAY IN THE MORNING [provider] Taking Active   lisinopril (ZESTRIL) 20 MG tablet [id number] Yes Take 1 tablet (20 mg total) by mouth daily. Marnee Guarneri T, NP Taking Active   metoprolol succinate (TOPROL-XL) 25 MG 24 hr tablet [id number] Yes TAKE 1 TABLET BY MOUTH EVERY DAY Crissman, Jeannette How, MD Taking Active   Multiple Vitamin (MULTIVITAMIN WITH MINERALS) TABS tablet [id number] Yes Take 1 tablet by mouth daily. Nicholes Mango, MD Taking Active Spouse/Significant Other  pantoprazole (PROTONIX) 40 MG tablet [id number] Yes Take 1 tablet (40 mg total) by mouth daily. Venita Lick, NP Taking Active   Spacer/Aero-Holding Chambers (AEROCHAMBER W/FLOWSIGNAL) inhaler [id number] Yes Inhale into the lungs. [provider] Taking Active   SPIRIVA HANDIHALER 18 MCG inhalation capsule [id number] Yes Place 1 capsule (18 mcg total) into inhaler and inhale daily. Park Liter P, DO Taking Active   tamsulosin (  FLOMAX) 0.4 MG CAPS capsule [id number] Yes TAKE 1 CAPSULE BY MOUTH EVERY DAY Crissman, Jeannette How, MD Taking Active   vitamin C (ASCORBIC ACID) 250 MG tablet [id number] Yes Take 250 mg by mouth 2 (two) times daily. [provider] Taking Active            Assessment:   Goals Addressed            This Visit's Progress     Patient Stated   . PharmD "This medication isn't lasting"  (pt-stated)       Current Barriers:  . Polypharmacy; complex patient with multiple comorbidities including severe COPD, tobacco abuse, HTN, atherosclerosis, aortic stenosis, GERD, BPH . Daughter helps manage medications. They live together and rely on each other for financial support. They report that they previously applied for Medicare Extra Help, but were denied d/t daughter's income/resources.  o COPD - Spiriva Handihaler 18 mcg daily, Symbicort 160/4.5 mcg 2 puffs BID, albuterol PRN. They report that Symbicort, though patient is taking 2 puffs twice daily, is not lasting a full 30 days. Having a difficult time affording 2 brand name copays, especially given deductible o HTN/atherosclerosis/aortic stenosis: amlodipine 5 mg daily, ASA 81 mg daily, furosemide 20 mg daily, lisinorpil 20 mg twice weekly, metoprolol succinate 25 mg daily  o GERD: pantoprazole 40 mg daily o BPH: tamsulosin 0.4 mg daily   Pharmacist Clinical Goal(s):  Marland Kitchen Over the next 90 days, patient will work with PharmD and provider towards optimized medication management  Interventions: . Comprehensive medication review performed; medication list updated in electronic medical record . Reviewed inhalation technique. Reviewed Symbicort administration technique. Patient may be accidentally using multiple puffs at once due to coordinating pump and inhalation. Appropriate to consider alternative device. Collaborated w/ Marnee Guarneri, will send prescription for Trelegy to consolidate regimen and allow for once daily administration.  . Patient previously applied for Medicare Extra Help and was denied. They are hesitant to pursue patient assistance. Can evaluate need/ability for Trelegy patient assistance if covered, if they meet $600 total prescription out of pocket. . Daughter is to call me if Trelegy is not covered.   Patient Self Care Activities:  . Patient will take medications as prescribed  Initial goal documentation         Plan: - Will collaborate w/ patient, daughter, provider as above - Scheduled f/u call for full medication review 06/18/19  Catie Darnelle Maffucci, PharmD, [place] [phone]

## 2019-05-13 NOTE — Assessment & Plan Note (Signed)
Chronic, ongoing.  Continue current medication regimen and adjust as needed.  Recommend he check his BP at least 3 times a week and document.  Return to office in 6 months and will obtain labs if patient agrees.

## 2019-05-14 DIAGNOSIS — R911 Solitary pulmonary nodule: Secondary | ICD-10-CM | POA: Diagnosis not present

## 2019-05-14 DIAGNOSIS — R918 Other nonspecific abnormal finding of lung field: Secondary | ICD-10-CM | POA: Diagnosis not present

## 2019-05-19 ENCOUNTER — Other Ambulatory Visit: Payer: Medicare HMO | Admitting: Primary Care

## 2019-05-19 ENCOUNTER — Other Ambulatory Visit: Payer: Self-pay

## 2019-05-19 DIAGNOSIS — Z515 Encounter for palliative care: Secondary | ICD-10-CM | POA: Diagnosis not present

## 2019-05-19 NOTE — Progress Notes (Signed)
[facility] Consult Note Telephone: [phone]  Fax: [phone]  PATIENT NAME: Patrick Ayala [address] [place] [address] [phone] (home)  DOB: 1940-07-11 MRN: [id number]  PRIMARY CARE PROVIDER:   Venita Lick, NP, [place] [address] [phone]  REFERRING PROVIDER:  Venita Lick, NP [place],  [place] [address] [phone]  RESPONSIBLE PARTY:   Extended Emergency Contact Information Primary Emergency Contact: Patrick Ayala L Address: [address]          [place], [place] [address] Johnnette Litter of [place] Phone: [phone] Relation: Spouse   ASSESSMENT AND RECOMMENDATIONS:   1. Advance Care Planning/Goals of Care: Goals include to maximize quality of life and symptom management. Wife is not home despite appt to be here. He states she works til 4. He states they did not do any power of attny or healthcare directives.  I left my number for her to call with any questions.  2. Symptom Management:   Respiratory status: Using oxygen, and a NIV, Trelogy. Wants to get a portable oxygen concentrator. States his brother in law recently got one. Discussed the vaccine. He states he does not want it. Wife has taken it. Discussed respiratory immunizations but he is resistent. States he still smokes.He states he feels better with smoking, not coughing when he smokes. He is smoking in the house per his report.  Sleep: Sleeps well with Trelogy which he uses every night and for naps.  States he feels not very  mentally alert still.    Nebulizer: Needs to have a nebulizer and medications to have on hand. He has to be conservative with the albuterol inhaler due to reimbursement. He states he [facility]. Recommend PCP to order a nebulizer and medications for prn.  Medications: Changed Symbicort to Trelogy. States he is using it daily.  Nutrition: 114 lbs, eats eggs, sausage, sandwiches. States  he eats a normal portion, but does not have a good appetite. Makes himself eat. Not a water drinker. Drinks other drinks e.g. tea, OJ.  3. Family /Caregiver/Community Supports: Wife lives with patient, children in area.States he is driving less.   4. Cognitive / Functional decline: Alert, oriented x 3. Able to ambulate in home.  5. Follow up Palliative Care Visit: Palliative care will continue to follow for goals of care clarification and symptom management. Return 8 weeks or prn.  I spent 60 minutes providing this consultation,  from 1545 to 1645. More than 50% of the time in this consultation was spent coordinating communication.   HISTORY OF PRESENT ILLNESS:  Patrick Ayala is a 79 y.o. year old male with multiple medical problems including protein calorie malnutrition, COPD, tobacco abuse, oxygen dependence. Palliative Care was asked to follow this patient by consultation request of Venita Lick, NP to help address advance care planning and goals of care. This is a follow up visit.  CODE STATUS: TBD PPS: 40% HOSPICE ELIGIBILITY/DIAGNOSIS: TBD  PAST MEDICAL HISTORY:  Past Medical History:  Diagnosis Date  . Carboxyhemoglobinemia   . COPD (chronic obstructive pulmonary disease) (HCC)    on 2.5L chronic home o2  . Hypertension   . Polycythemia, secondary 10/20/2014  . Thrombocytopenia ([place])     SOCIAL HX:  Social History   Tobacco Use  . Smoking status: Current Some Day Smoker    Packs/day: 0.25    Types: Cigarettes  . Smokeless tobacco: Never Used  . Tobacco  comment: Smoking History 1(1)Packs per day currently smoking 4-5 cigarettes a day   Substance Use Topics  . Alcohol use: Yes    Alcohol/week: 5.0 standard drinks    Types: 5 Cans of beer per week    ALLERGIES: No Known Allergies   PERTINENT MEDICATIONS:  Outpatient Encounter Medications as of 05/19/2019  Medication Sig  . albuterol (VENTOLIN HFA) 108 (90 Base) MCG/ACT inhaler Inhale 1-2 puffs into the lungs every  6 (six) hours as needed for wheezing or shortness of breath.  Marland Kitchen amLODipine (NORVASC) 5 MG tablet TAKE 1 TABLET (5 MG TOTAL) BY MOUTH DAILY.  Marland Kitchen aspirin 81 MG chewable tablet Chew by mouth.  Marland Kitchen atorvastatin (LIPITOR) 20 MG tablet TAKE 1 TABLET EVERY DAY FOR HYPERCHOLESTEROLEMIA  . fluticasone (FLONASE) 50 MCG/ACT nasal spray   . Fluticasone-Umeclidin-Vilant (TRELEGY ELLIPTA) 100-62.5-25 MCG/INH AEPB Inhale 1 puff into the lungs daily.  . furosemide (LASIX) 20 MG tablet TAKE 1 TABLET EVERY TUESDAY AND THURSDAY IN THE MORNING  . lisinopril (ZESTRIL) 20 MG tablet Take 1 tablet (20 mg total) by mouth daily.  . metoprolol succinate (TOPROL-XL) 25 MG 24 hr tablet TAKE 1 TABLET BY MOUTH EVERY DAY  . Multiple Vitamin (MULTIVITAMIN WITH MINERALS) TABS tablet Take 1 tablet by mouth daily.  . pantoprazole (PROTONIX) 40 MG tablet Take 1 tablet (40 mg total) by mouth daily.  Marland Kitchen Spacer/Aero-Holding Chambers (AEROCHAMBER W/FLOWSIGNAL) inhaler Inhale into the lungs.  . SPIRIVA HANDIHALER 18 MCG inhalation capsule Place 1 capsule (18 mcg total) into inhaler and inhale daily.  . tamsulosin (FLOMAX) 0.4 MG CAPS capsule TAKE 1 CAPSULE BY MOUTH EVERY DAY  . vitamin C (ASCORBIC ACID) 250 MG tablet Take 250 mg by mouth 2 (two) times daily.   No facility-administered encounter medications on file as of 05/19/2019.    PHYSICAL EXAM / ROS:   Current and past weights: 114 lbs General: NAD, frail appearing, cachectic Cardiovascular: no chest pain reported, no edema Pulmonary: no cough, no increased SOB,Oxygen 2.5 L, DOE, takes a few minutes to recover. Uses trilogy at hs, during naps.  Abdomen: appetite fair,denies constipation, continent of bowel GU: denies dysuria, continent of urine MSK:  no joint deformities, ambulatory in home Skin: no rashes or wounds reported Neurological: Weakness, denies pain, endorses restful sleep.  Patrick Coop, NP  [facility]  COVID-19 PATIENT SCREENING TOOL  Person answering  questions: _________[name]__________ _____   1.  Is the patient or any family member in the home showing any signs or symptoms regarding respiratory infection?               Person with Symptom- ___________________________  a. Fever                                                                          Yes___ No___          ___________________  b. Shortness of breath                                                    Yes___ No___  ___________________ c. Cough/congestion                                       Yes___  No___         ___________________ d. Body aches/pains                                                         Yes___ No___        ____________________ e. Gastrointestinal symptoms (diarrhea, nausea)           Yes___ No___        ____________________  2. Within the past 14 days, has anyone living in the home had any contact with someone with or under investigation for COVID-19?    Yes___ No_x_   Person __________________

## 2019-05-22 ENCOUNTER — Ambulatory Visit: Payer: Self-pay | Admitting: General Practice

## 2019-05-22 DIAGNOSIS — I270 Primary pulmonary hypertension: Secondary | ICD-10-CM

## 2019-05-22 DIAGNOSIS — E785 Hyperlipidemia, unspecified: Secondary | ICD-10-CM | POA: Diagnosis not present

## 2019-05-22 DIAGNOSIS — I10 Essential (primary) hypertension: Secondary | ICD-10-CM

## 2019-05-22 DIAGNOSIS — J449 Chronic obstructive pulmonary disease, unspecified: Secondary | ICD-10-CM

## 2019-05-22 NOTE — Chronic Care Management (AMB) (Signed)
Chronic Care Management   Follow Up Note   05/22/2019 Name: Patrick Ayala MRN: [id number] DOB: May 28, 1940  Referred by: Venita Lick, NP Reason for referral : Chronic Care Management (Portable oxygen needs/Collaboration COPD/Pulmonary HTN )   Patrick Ayala is a 79 y.o. year old male who is a primary care patient of [name], [name], NP. The CCM team was consulted for assistance with chronic disease management and care coordination needs.    Review of patient status, including review of consultants reports, relevant laboratory and other test results, and collaboration with appropriate care team members and the patient's provider was performed as part of comprehensive patient evaluation and provision of chronic care management services.    SDOH (Social Determinants of Health) screening performed today: None. See Care Plan for related entries.   Outpatient Encounter Medications as of 05/22/2019  Medication Sig   albuterol (VENTOLIN HFA) 108 (90 Base) MCG/ACT inhaler Inhale 1-2 puffs into the lungs every 6 (six) hours as needed for wheezing or shortness of breath.   amLODipine (NORVASC) 5 MG tablet TAKE 1 TABLET (5 MG TOTAL) BY MOUTH DAILY.   aspirin 81 MG chewable tablet Chew by mouth.   atorvastatin (LIPITOR) 20 MG tablet TAKE 1 TABLET EVERY DAY FOR HYPERCHOLESTEROLEMIA   fluticasone (FLONASE) 50 MCG/ACT nasal spray    Fluticasone-Umeclidin-Vilant (TRELEGY ELLIPTA) 100-62.5-25 MCG/INH AEPB Inhale 1 puff into the lungs daily.   furosemide (LASIX) 20 MG tablet TAKE 1 TABLET EVERY TUESDAY AND THURSDAY IN THE MORNING   lisinopril (ZESTRIL) 20 MG tablet Take 1 tablet (20 mg total) by mouth daily.   metoprolol succinate (TOPROL-XL) 25 MG 24 hr tablet TAKE 1 TABLET BY MOUTH EVERY DAY   Multiple Vitamin (MULTIVITAMIN WITH MINERALS) TABS tablet Take 1 tablet by mouth daily.   pantoprazole (PROTONIX) 40 MG tablet Take 1 tablet (40 mg total) by mouth daily.   Spacer/Aero-Holding  Chambers (AEROCHAMBER W/FLOWSIGNAL) inhaler Inhale into the lungs.   SPIRIVA HANDIHALER 18 MCG inhalation capsule Place 1 capsule (18 mcg total) into inhaler and inhale daily.   tamsulosin (FLOMAX) 0.4 MG CAPS capsule TAKE 1 CAPSULE BY MOUTH EVERY DAY   vitamin C (ASCORBIC ACID) 250 MG tablet Take 250 mg by mouth 2 (two) times daily.   No facility-administered encounter medications on file as of 05/22/2019.     Objective:   Goals Addressed            This Visit's Progress    RNCM: I need a portable 02 concentrator and I would like palliative care (pt-stated)       Current Barriers:   Chronic Disease Management support and education needs related to COPD and Heart Disease   No Advanced Directives in place  Noted advanced COPD with need of Palliative support  Needs for re-qualifying for 02 and patient requests for a lighter portable 02 related to weakness.   Nurse Case Manager Clinical Goal(s):   Over the next 90 days, patient will work with [facility]  to address needs related to multiple Chronic disease processes   Over the next 90 days, patient will attend all scheduled medical appointments: Collaboration with [facility] pulmonary clinic to get walk test completed for DME needs  Interventions:   Called billing dept at [facility] [phone], representative stating she has received the notes from 10/7 showing patient was 87% on 2.5lpm on the walk test but since there was not a room air test they can not re-qualify patient. Testing is specific to needing a  resting room air sat of less than 88% or the complete 6 min walk test which patient was unable to complete.  Placed a call to [facility] and spoke to [place] at [phone]. The patient has an order for a 6 minute walk test but does not have an appointment to have this completed. Number given to the [facility] clinic [phone]. This will be more convenient for the patient   Placed a call to the patient to arrange an  appointment for the patient to go to the [facility] to have 6 minute walk test done. The patients wife was not there on the 2nd call back to the patient but the patient ask the RNCM to call back and leave the number to the [facility] clinic on his VM. Called back and left that information and RNCM information on the patients VM.  Will continue to follow and assist accordingly    Discussed plans with patient for ongoing care management follow up and provided patient with direct contact information for care management team  Also requested [facility]FP Clinical staff to fax all necessary paperwork to [facility] [phone].  Patient Self Care Activities:   Attends all scheduled provider appointments  Unable to perform IADLs independently related to weakness, does not drive.   Please see past updates related to this goal by clicking on the "Past Updates" button in the selected goal          Plan:   The care management team will reach out to the patient again over the next 30 days.    Noreene Larsson RN, MSN, [facility] Mobile: [phone]

## 2019-05-22 NOTE — Patient Instructions (Signed)
Visit Information  Goals Addressed            This Visit's Progress   . RNCM: I need a portable 02 concentrator and I would like palliative care (pt-stated)       Current Barriers:  . Chronic Disease Management support and education needs related to COPD and Heart Disease  . No Advanced Directives in place . Noted advanced COPD with need of Palliative support . Needs for re-qualifying for 02 and patient requests for a lighter portable 02 related to weakness.   Nurse Case Manager Clinical Goal(s):  Marland Kitchen Over the next 90 days, patient will work with [facility]  to address needs related to multiple Chronic disease processes  . Over the next 90 days, patient will attend all scheduled medical appointments: Collaboration with [facility] pulmonary clinic to get walk test completed for DME needs  Interventions:  . Called billing dept at [facility] [phone], representative stating she has received the notes from 10/7 showing patient was 87% on 2.5lpm on the walk test but since there was not a room air test they can not re-qualify patient. Testing is specific to needing a resting room air sat of less than 88% or the complete 6 min walk test which patient was unable to complete. . Placed a call to [facility] and spoke to [place] at [phone]. The patient has an order for a 6 minute walk test but does not have an appointment to have this completed. Number given to the [facility] clinic [phone]. This will be more convenient for the patient  . Placed a call to the patient to arrange an appointment for the patient to go to the [facility] to have 6 minute walk test done. The patients wife was not there on the 2nd call back to the patient but the patient ask the RNCM to call back and leave the number to the [facility] clinic on his VM. Called back and left that information and RNCM information on the patients VM.  Will continue to follow and assist accordingly   . Discussed plans with patient for  ongoing care management follow up and provided patient with direct contact information for care management team . Also requested CFP Clinical staff to fax all necessary paperwork to [facility] [phone].  Patient Self Care Activities:  . Attends all scheduled provider appointments . Unable to perform IADLs independently related to weakness, does not drive.   Please see past updates related to this goal by clicking on the "Past Updates" button in the selected goal         The patient verbalized understanding of instructions provided today and declined a print copy of patient instruction materials.   The care management team will reach out to the patient again over the next 30 days.   Noreene Larsson RN, MSN, [facility] Mobile: [phone]

## 2019-05-23 ENCOUNTER — Ambulatory Visit: Payer: Self-pay | Admitting: General Practice

## 2019-05-23 DIAGNOSIS — J449 Chronic obstructive pulmonary disease, unspecified: Secondary | ICD-10-CM

## 2019-05-23 DIAGNOSIS — I270 Primary pulmonary hypertension: Secondary | ICD-10-CM

## 2019-05-23 NOTE — Patient Instructions (Signed)
Visit Information  Goals Addressed   None     The patient verbalized understanding of instructions provided today and declined a print copy of patient instruction materials.   The care management team will reach out to the patient again over the next 30 to 60 days.   Noreene Larsson RN, MSN, [facility] Mobile: [phone]

## 2019-05-23 NOTE — Chronic Care Management (AMB) (Signed)
  Chronic Care Management   Note  05/23/2019 Name: Patrick Ayala MRN: [id number] DOB: 1940/08/28  The patients wife, [name] had left a VM for the [facility] and ask for a call back. The patients wife has called the [facility] number but did not get anyone. Verified the number with the patients wife. Likely the office is closed today. Advised the patients wife to call the office back on Monday.  She has left a message on the VM system at the [facility] pulmonary clinic in [place]. The RNCM also provided the patient's wife with the main office number to the [facility] pulmonary Clinic in [place]. Will continue to assist as needed in securing an appointment for completion of walk test and other documentation to get portable oxygen for the patient.    Follow up plan: The care management team will reach out to the patient again over the next 30 to 60 days days.   Noreene Larsson RN, MSN, [facility] Mobile: [phone]

## 2019-05-24 ENCOUNTER — Other Ambulatory Visit: Payer: Self-pay | Admitting: Nurse Practitioner

## 2019-05-24 MED ORDER — ALBUTEROL SULFATE (2.5 MG/3ML) 0.083% IN NEBU: 2.5000 mg | INHALATION_SOLUTION | Freq: Four times a day (QID) | RESPIRATORY_TRACT | 2 refills | Status: AC | PRN

## 2019-05-26 ENCOUNTER — Telehealth: Payer: Self-pay | Admitting: Nurse Practitioner

## 2019-05-26 NOTE — Telephone Encounter (Signed)
Routing to provider. Is there anyway we can addend the note from last visit to document need for nebulizer?

## 2019-05-26 NOTE — Telephone Encounter (Signed)
Gilda from [facility] called and wanted to tell the nurse that the Chart notes from his visit does not mention the nebulizer being ordered and they have a pharmacy that can provide nebulizer solution as well/ please advise

## 2019-05-26 NOTE — Telephone Encounter (Signed)
I added to plan on recent note and it is also noted on palliative NP note recently.  Hopefully, this will help.

## 2019-05-27 ENCOUNTER — Telehealth: Payer: Self-pay | Admitting: Nurse Practitioner

## 2019-05-27 NOTE — Telephone Encounter (Signed)
This was ordered by [facility] pulmonary, he will have to contact them for results and review.  Thank you.

## 2019-05-27 NOTE — Telephone Encounter (Signed)
Addended note printed and faxed to [facility]. Called and let Bethanne Ginger know that this was done.

## 2019-05-27 NOTE — Telephone Encounter (Signed)
Copied from [place] [phone]. Topic: General - Other >> May 27, 2019 11:00 AM Keene Breath wrote: Reason for CRM: Patient called to speak with the nurse or doctor regarding his CT scan.  He stated that it has been a few weeks and he still has not heard anything yet.  CB# [phone]

## 2019-05-28 ENCOUNTER — Telehealth: Payer: Self-pay

## 2019-05-28 NOTE — Telephone Encounter (Signed)
Called pt and advised him of Jolene's message, pt verbalized understanding

## 2019-05-28 NOTE — Telephone Encounter (Signed)
Noted, thank you.

## 2019-05-30 DIAGNOSIS — J449 Chronic obstructive pulmonary disease, unspecified: Secondary | ICD-10-CM | POA: Diagnosis not present

## 2019-06-01 DIAGNOSIS — J9622 Acute and chronic respiratory failure with hypercapnia: Secondary | ICD-10-CM | POA: Diagnosis not present

## 2019-06-01 DIAGNOSIS — J449 Chronic obstructive pulmonary disease, unspecified: Secondary | ICD-10-CM | POA: Diagnosis not present

## 2019-06-17 ENCOUNTER — Telehealth: Payer: Self-pay

## 2019-06-17 ENCOUNTER — Ambulatory Visit: Payer: Self-pay | Admitting: General Practice

## 2019-06-17 NOTE — Chronic Care Management (AMB) (Signed)
Chronic Care Management   Outreach Note  06/17/2019 Name: RHONIN TROTT MRN: [id number] DOB: 14-Jan-1941  Referred by: Venita Lick, NP Reason for referral : Chronic Care Management (follow up: Walk test?COPD/HTN/HLD)   An unsuccessful telephone outreach was attempted today. The patient was referred to the case management team for assistance with care management and care coordination.   Follow Up Plan: A HIPPA compliant phone message was left for the patient providing contact information and requesting a return call.   Noreene Larsson RN, MSN, [facility] Mobile: [phone]

## 2019-06-18 ENCOUNTER — Ambulatory Visit: Payer: Self-pay | Admitting: Pharmacist

## 2019-06-18 ENCOUNTER — Telehealth: Payer: Self-pay

## 2019-06-18 NOTE — Chronic Care Management (AMB) (Signed)
  Chronic Care Management   Note  06/18/2019 Name: Patrick Ayala MRN: [id number] DOB: [birth year]  Patrick Ayala is a 79 y.o. year old male who is a primary care patient of [name], [name], NP. The CCM team was consulted for assistance with chronic disease management and care coordination needs.    Attempted to contact patient for medication management review. Left HIPAA compliant message for patient to return my call at their convenience.   Plan: - Will collaborate with Care Guide to outreach to schedule follow up with me  Catie Darnelle Maffucci, PharmD, [place] [phone]

## 2019-06-19 ENCOUNTER — Telehealth: Payer: Self-pay | Admitting: Nurse Practitioner

## 2019-06-19 NOTE — Chronic Care Management (AMB) (Signed)
  Care Management   Note  06/19/2019 Name: Patrick Ayala MRN: [id number] DOB: 09-11-1940  Patrick Ayala is a 79 y.o. year old male who is a primary care patient of Venita Lick, NP and is actively engaged with the care management team. I reached out to Roney Marion by phone today to assist with scheduling an initial visit with the Pharmacist  Follow up plan: Telephone appointment with care management team member scheduled for: 07/18/2019  Noreene Larsson, [place], [place], [place] [address] Direct Dial: [phone] [email or web address] Website: [email or web address]

## 2019-06-19 NOTE — Chronic Care Management (AMB) (Signed)
  Care Management   Note  06/19/2019 Name: [name] MRN: [id number] DOB: April 06, 1941  Patrick Ayala is a 79 y.o. year old male who is a primary care patient of Venita Lick, NP and is actively engaged with the care management team. I reached out to [employer] by phone today to assist with re-scheduling an initial visit with the Pharmacist  Follow up plan: Unsuccessful telephone outreach attempt made. A HIPPA compliant phone message was left for the patient providing contact information and requesting a return call.  The care management team will reach out to the patient again over the next 7 days.  If patient returns call to provider office, please advise to call [facility]  at [place], [place], [place], [place] [address] Direct Dial: [phone] [email or web address] Website: [email or web address]

## 2019-06-20 ENCOUNTER — Encounter: Payer: Self-pay | Admitting: Nurse Practitioner

## 2019-06-27 ENCOUNTER — Telehealth: Payer: Self-pay | Admitting: Nurse Practitioner

## 2019-06-27 DIAGNOSIS — J449 Chronic obstructive pulmonary disease, unspecified: Secondary | ICD-10-CM | POA: Diagnosis not present

## 2019-06-27 MED ORDER — AMLODIPINE BESYLATE 10 MG PO TABS: 10.0000 mg | ORAL_TABLET | Freq: Every day | ORAL | 3 refills | Status: DC

## 2019-06-27 NOTE — Telephone Encounter (Signed)
Will send in Amlodipine, on review of last cardiology note Amlodipine 10 MG added October 2020 at [facility].

## 2019-06-27 NOTE — Telephone Encounter (Signed)
Copied from [place] [phone]. Topic: General - Inquiry >> Jun 27, 2019  8:25 AM Richardo Priest, NT wrote: Reason for CRM: Patient called in stating amLODipine (NORVASC) 10 MG tablet has run out but he still has some 66m and he is wondering if that's okay for him to take 2 of, while he waits for a refill on 135m Please advise

## 2019-06-29 DIAGNOSIS — J9622 Acute and chronic respiratory failure with hypercapnia: Secondary | ICD-10-CM | POA: Diagnosis not present

## 2019-06-29 DIAGNOSIS — J449 Chronic obstructive pulmonary disease, unspecified: Secondary | ICD-10-CM | POA: Diagnosis not present

## 2019-07-01 ENCOUNTER — Telehealth: Payer: Self-pay

## 2019-07-09 ENCOUNTER — Ambulatory Visit: Payer: Self-pay | Admitting: General Practice

## 2019-07-09 ENCOUNTER — Telehealth: Payer: Self-pay

## 2019-07-09 NOTE — Chronic Care Management (AMB) (Signed)
Chronic Care Management   Outreach Note  07/09/2019 Name: Patrick Ayala MRN: [id number] DOB: Jul 22, 1940  Referred by: Venita Lick, NP Reason for referral : Chronic Care Management (Follow up: 2nd attempt: Chronic health condition and care coordination needs)   An unsuccessful telephone outreach was attempted today. The patient was referred to the case management team for assistance with care management and care coordination.   Follow Up Plan: A HIPPA compliant phone message was left for the patient providing contact information and requesting a return call.   Noreene Larsson RN, MSN, [facility] Mobile: [phone]

## 2019-07-18 ENCOUNTER — Ambulatory Visit: Payer: Self-pay | Admitting: Pharmacist

## 2019-07-18 ENCOUNTER — Telehealth: Payer: Medicare HMO

## 2019-07-18 NOTE — Chronic Care Management (AMB) (Signed)
  Chronic Care Management   Note  07/18/2019 Name: Patrick Ayala MRN: [id number] DOB: [birth year]  Patrick Ayala is a 79 y.o. year old male who is a primary care patient of [name], [name], NP. The CCM team was consulted for assistance with chronic disease management and care coordination needs.    Attempted to contact patient for medication management review. Left HIPAA compliant message for patient to return my call at their convenience if they have medication questions or concerns. 3+ consecutive unsuccessful outreach attempts by [facility] team; will close case at the time.   Plan: - CCM team would be happy to re-engage in the future with a new referral  Catie Darnelle Maffucci, PharmD, [place] [phone]

## 2019-07-25 ENCOUNTER — Ambulatory Visit: Payer: Self-pay

## 2019-07-25 NOTE — Telephone Encounter (Signed)
Patient may need visit prior to August scheduled please.  He does well when wife with him.  Catie or Pam, can you chat with his wife or him today?  He is supposed to be seeing pulmonary at [facility], I am not sure when he last saw them.  I know last I checked it had been awhile and I had told his wife and him of need to follow-up with them.  He is also followed by pulmonary.  Let me know if I need to help with anything.  Thank you.

## 2019-07-25 NOTE — Telephone Encounter (Signed)
I returned his call. "I put on my BIPAP and my oxygen drops"   "I've had  this for 6 months".  My oxygen drops when I put it on.   "This has been happening since I got the BiPAP 6 months ago".  " I use the nasal mask".    My oxygen won't register.  It has not done that since I got it.   I have a pulse thing like in the office for my finger.   It won't read.   It will register when I take my BiPAP mask off but when I put my mask on it stops registering.   Denies shortness of breath when mask is on.    I was wondering why my oxygen is dropping when I put it on.   I have a nasal cannula on then I put my BiPAP on with it.    I had a very difficult time triaging this pt.   He wasn't very clear in his explanations.    "My wife usually takes care of all of this but she is gone right now".    "She won't be back until 5:00 this evening" when I asked him if she was there so I could talk with her as I'm not sure he is understanding.  I'll try to sum it up.    He has been on the BiPAP for 6 months.   This was prescribed from [facility].  He doesn't know any of the doctor's names.   "My wife has the papers and she is not home right now".   I looked in his chart and that's where I determined he is managed at [facility].    I asked him what was different today that he called since "it has never registered with my BiPAP on since I got it 6 months ago".   He just kept saying it has never registered when I put my BiPAP on.   He uses a pulse oximeter from his description to monitor his oxygen.   He also mentioned he wears his BiPAP during the day.  He says he wears it at night too when I asked hm.   He is also on oxygen sounds like a nasal cannula from his description.    He then requested an inhaler to use in the mornings because sometimes he feels short of breath.  He is on several inhalers.   I asked if he was using the nebulizer after looking at his medication list.   He said he was using  it.   I finally determined he was talking about the albuterol inhaler.    I et him know I would send a note to [facility] and have someone give him a call back.  He was agreeable to that.  I forwarded these notes to Marnee Guarneri, NP for further disposition.       Reason for Disposition . Nursing judgment  Protocols used: NO GUIDELINE OR REFERENCE AVAILABLE-A-AH

## 2019-07-25 NOTE — Telephone Encounter (Signed)
Called patient and LVM for patient to return call to schedule a follow up. Also called patient's wife Darlene and LVM for her to return my call. (DPR reviewed)

## 2019-07-28 DIAGNOSIS — J449 Chronic obstructive pulmonary disease, unspecified: Secondary | ICD-10-CM | POA: Diagnosis not present

## 2019-07-29 DIAGNOSIS — I2723 Pulmonary hypertension due to lung diseases and hypoxia: Secondary | ICD-10-CM | POA: Diagnosis not present

## 2019-07-29 DIAGNOSIS — J439 Emphysema, unspecified: Secondary | ICD-10-CM | POA: Diagnosis not present

## 2019-07-29 DIAGNOSIS — R0600 Dyspnea, unspecified: Secondary | ICD-10-CM | POA: Diagnosis not present

## 2019-07-29 DIAGNOSIS — R911 Solitary pulmonary nodule: Secondary | ICD-10-CM | POA: Diagnosis not present

## 2019-07-29 DIAGNOSIS — J449 Chronic obstructive pulmonary disease, unspecified: Secondary | ICD-10-CM | POA: Diagnosis not present

## 2019-07-29 NOTE — Telephone Encounter (Signed)
Called and spoke with patient. Scheduled for Friday, 08/01/19 at 3:15 pm

## 2019-07-30 ENCOUNTER — Ambulatory Visit: Payer: Medicare HMO | Admitting: Nurse Practitioner

## 2019-07-30 DIAGNOSIS — J9622 Acute and chronic respiratory failure with hypercapnia: Secondary | ICD-10-CM | POA: Diagnosis not present

## 2019-07-30 DIAGNOSIS — J449 Chronic obstructive pulmonary disease, unspecified: Secondary | ICD-10-CM | POA: Diagnosis not present

## 2019-08-01 ENCOUNTER — Ambulatory Visit: Payer: Medicare HMO | Admitting: Nurse Practitioner

## 2019-08-22 ENCOUNTER — Telehealth: Payer: Self-pay

## 2019-09-10 ENCOUNTER — Other Ambulatory Visit: Payer: Self-pay | Admitting: Nurse Practitioner

## 2019-09-10 NOTE — Telephone Encounter (Signed)
Requested medications are due for refill today? Yes  Requested medications are on active medication list?  Yes  Last Refill:  05/13/2019  # 60 each with 3 refills.    Future visit scheduled?  Yes  Notes to Clinic:  Medication not assigned to a protocol.

## 2019-09-15 ENCOUNTER — Other Ambulatory Visit: Payer: Medicare HMO | Admitting: Primary Care

## 2019-09-15 ENCOUNTER — Other Ambulatory Visit: Payer: Self-pay

## 2019-09-15 DIAGNOSIS — Z515 Encounter for palliative care: Secondary | ICD-10-CM

## 2019-09-15 NOTE — Progress Notes (Signed)
[facility] Consult Note Telephone: [phone]  Fax: [phone]  PATIENT NAME: Patrick Ayala [address] [place] [address] [phone] (home)  DOB: 08-27-1940 MRN: [id number]  PRIMARY CARE PROVIDER:    Venita Lick, NP,  [place] [address] [phone]  REFERRING PROVIDER:   Venita Lick, NP [place],  [place] [address] [phone]  RESPONSIBLE PARTY:   Extended Emergency Contact Information Primary Emergency Contact: Patrick Ayala,Patrick Ayala Address: [address]          [place], [place] [address] [place] Phone: [phone] Relation: Spouse  I met with patient and family in home.  ASSESSMENT AND RECOMMENDATIONS:   1. Advance Care Planning/Goals of Care: Goals include to maximize quality of life and symptom management. Our advance care planning conversation included a discussion about:     The value and importance of advance care planning   Experiences with loved ones who have been seriously ill or have died   Exploration of personal, cultural or spiritual beliefs that might influence medical decisions   Exploration of goals of care in the event of a sudden injury or illness   Identification and preparation of a healthcare agent   Review and updating or creation of an  advance directive document . Today we created a MOST with DNR, limited scope of care, use of abx, trial of iv and feeding tube. Wife was in attendance. He endorsed this was a very difficult discussion for patient to have. They approved to have document uploaded in [place].  2. Symptom Management:   Nutrition: Poor, suggested boost breeze as he does not like the milk based supplement.  Breathing: Sleeps in recliner. Has oxygen at all times, 3 liters, uses Trelogy machine. Still smoking daily. Denies wish to have cessation information. Cough is productive.  Refuses covid vaccine. Discussed, does not want to get it.  Reviewed breathing meds, has Trellegy Elipta.  Falls: Denies. Has alexa to call if he falls. This is set up in proximity to his chair. Family is with him all but about 4 hours a day. He ambulates well with no devices.   3. Family /Caregiver/Community Supports: [place] Family cares for him, no additional caregivers. Wife and son both work and take turns with care.   4. Cognitive / Functional decline: A and O x 3, sometimes forgetful. Able to ambulate slowly with holding to furniture. Needs help with adls and iadls due to dyspnea.  5. Follow up Palliative Care Visit: Palliative care will continue to follow for goals of care clarification and symptom management. Return 8 weeks or prn.  I spent 60 minutes providing this consultation,  from 1500 to 1600. More than 50% of the time in this consultation was spent coordinating communication.   HISTORY OF PRESENT ILLNESS:  Patrick Ayala is a 79 y.o. year old male with multiple medical problems including COPD, tobacco abuse, cachexia. Palliative Care was asked to follow this patient by consultation request of Patrick Lick, NP to help address advance care planning and goals of care. This is a follow up visit.  CODE STATUS: DNR  PPS: 30%  HOSPICE ELIGIBILITY/DIAGNOSIS: TBD  PAST MEDICAL HISTORY:  Past Medical History:  Diagnosis Date  . Carboxyhemoglobinemia   . COPD (chronic obstructive pulmonary disease) (HCC)    on 2.5L chronic home o2  . Hypertension   . Polycythemia,  secondary 10/20/2014  . Thrombocytopenia ([place])     SOCIAL HX:  Social History   Tobacco Use  . Smoking status: Current Some Day Smoker    Packs/day: 0.25    Types: Cigarettes  . Smokeless tobacco: Never Used  . Tobacco comment: Smoking History 1(1)Packs per day currently smoking 4-5 cigarettes a day   Substance Use Topics  . Alcohol use: Yes    Alcohol/week: 5.0 standard drinks    Types: 5 Cans of beer per week     ALLERGIES: No Known Allergies   PERTINENT MEDICATIONS:  Outpatient Encounter Medications as of 09/15/2019  Medication Sig  . albuterol (PROVENTIL) (2.5 MG/3ML) 0.083% nebulizer solution Take 3 mLs (2.5 mg total) by nebulization every 6 (six) hours as needed for wheezing or shortness of breath.  Marland Kitchen albuterol (VENTOLIN HFA) 108 (90 Base) MCG/ACT inhaler Inhale 1-2 puffs into the lungs every 6 (six) hours as needed for wheezing or shortness of breath.  Marland Kitchen amLODipine (NORVASC) 10 MG tablet Take 1 tablet (10 mg total) by mouth daily.  Marland Kitchen aspirin 81 MG chewable tablet Chew by mouth.  Marland Kitchen atorvastatin (LIPITOR) 20 MG tablet TAKE 1 TABLET EVERY DAY FOR HYPERCHOLESTEROLEMIA  . fluticasone (FLONASE) 50 MCG/ACT nasal spray   . furosemide (LASIX) 20 MG tablet TAKE 1 TABLET EVERY TUESDAY AND THURSDAY IN THE MORNING  . lisinopril (ZESTRIL) 20 MG tablet Take 1 tablet (20 mg total) by mouth daily.  . metoprolol succinate (TOPROL-XL) 25 MG 24 hr tablet TAKE 1 TABLET BY MOUTH EVERY DAY  . Multiple Vitamin (MULTIVITAMIN WITH MINERALS) TABS tablet Take 1 tablet by mouth daily.  . pantoprazole (PROTONIX) 40 MG tablet Take 1 tablet (40 mg total) by mouth daily.  Marland Kitchen Spacer/Aero-Holding Chambers (AEROCHAMBER W/FLOWSIGNAL) inhaler Inhale into the lungs.  . SPIRIVA HANDIHALER 18 MCG inhalation capsule Place 1 capsule (18 mcg total) into inhaler and inhale daily.  . tamsulosin (FLOMAX) 0.4 MG CAPS capsule TAKE 1 CAPSULE BY MOUTH EVERY DAY  . TRELEGY ELLIPTA 100-62.5-25 MCG/INH AEPB TAKE 1 PUFF BY MOUTH EVERY DAY  . vitamin C (ASCORBIC ACID) 250 MG tablet Take 250 mg by mouth 2 (two) times daily.   No facility-administered encounter medications on file as of 09/15/2019.    PHYSICAL EXAM / ROS:   Current and past weights:  116 lbs.   General: NAD, frail appearing, cachectic , BMI = 16.2 Cardiovascular: no chest pain reported, no edema  Pulmonary: + cough, + increased SOB, oxygen at 3 [name], trilogy machine with face  mask- uses when resting  Abdomen: appetite fair, denies constipation, GU: denies dysuria,  MSK:  + joint and ROM abnormalities, ambulatory, fall risk Skin: no rashes or wounds reported Neurological: Weakness, but otherwise nonfocal  Patrick Coop, NP [facility]  COVID-19 PATIENT SCREENING TOOL  Person answering questions: __________Darlene_____ _____   1.  Is the patient or any family member in the home showing any signs or symptoms regarding respiratory infection?               Person with Symptom- __________NA_________________  a. Fever  Yes___ No___          ___________________  b. Shortness of breath                                                    Yes___ No___          ___________________ c. Cough/congestion                                       Yes___  No___         ___________________ d. Body aches/pains                                                         Yes___ No___        ____________________ e. Gastrointestinal symptoms (diarrhea, nausea)           Yes___ No___        ____________________  2. Within the past 14 days, has anyone living in the home had any contact with someone with or under investigation for COVID-19?    Yes___ No_X_   Person __________________

## 2019-09-23 ENCOUNTER — Other Ambulatory Visit: Payer: Self-pay | Admitting: Nurse Practitioner

## 2019-09-23 MED ORDER — LISINOPRIL 20 MG PO TABS: 20.0000 mg | ORAL_TABLET | Freq: Every day | ORAL | 2 refills | Status: DC

## 2019-09-23 NOTE — Telephone Encounter (Signed)
Routing to provider.  

## 2019-09-23 NOTE — Telephone Encounter (Signed)
Pt's wife [name] presented in office stating that pt needs to have a refill on [facility] [place]. States that the pharmacy told her that they had sent a request. Please advise

## 2019-09-26 ENCOUNTER — Telehealth: Payer: Self-pay | Admitting: Nurse Practitioner

## 2019-09-26 NOTE — Telephone Encounter (Signed)
Copied from [place] [phone]. Topic: General - Other >> Sep 25, 2019 10:49 AM Alanda Slim E wrote: Reason for CRM: Pt has switched insurance companies and he needs the office to get in touch with the people that supplies and services the By pass machine / Pt switched to [employer]/ please call Pt

## 2019-09-30 NOTE — Telephone Encounter (Signed)
Called and left a message asking patient to return my call.

## 2019-09-30 NOTE — Telephone Encounter (Signed)
Pt states he has a "by pack" machine. Asked him if it was a CPAP. He said no.  Pt states his pulmonary dr in [place] has got his situation worked out. Pt does not need anything form his pcp.

## 2019-10-03 NOTE — Telephone Encounter (Signed)
This has been taken care of by patients pulmonary doctor.

## 2019-10-16 ENCOUNTER — Other Ambulatory Visit: Payer: Self-pay

## 2019-10-16 DIAGNOSIS — E7800 Pure hypercholesterolemia, unspecified: Secondary | ICD-10-CM

## 2019-10-16 DIAGNOSIS — I10 Essential (primary) hypertension: Secondary | ICD-10-CM

## 2019-10-16 MED ORDER — PANTOPRAZOLE SODIUM 40 MG PO TBEC: 40.0000 mg | DELAYED_RELEASE_TABLET | Freq: Every day | ORAL | 3 refills | Status: AC

## 2019-10-16 MED ORDER — ATORVASTATIN CALCIUM 20 MG PO TABS: ORAL_TABLET | ORAL | 3 refills | Status: AC

## 2019-10-16 MED ORDER — FUROSEMIDE 20 MG PO TABS: ORAL_TABLET | ORAL | 4 refills | Status: DC

## 2019-10-16 MED ORDER — TAMSULOSIN HCL 0.4 MG PO CAPS: 0.4000 mg | ORAL_CAPSULE | Freq: Every day | ORAL | 4 refills | Status: DC

## 2019-10-16 MED ORDER — AMLODIPINE BESYLATE 10 MG PO TABS: 10.0000 mg | ORAL_TABLET | Freq: Every day | ORAL | 3 refills | Status: DC

## 2019-10-16 MED ORDER — LISINOPRIL 20 MG PO TABS: 20.0000 mg | ORAL_TABLET | Freq: Every day | ORAL | 2 refills | Status: DC

## 2019-10-16 MED ORDER — METOPROLOL SUCCINATE ER 25 MG PO TB24: 25.0000 mg | ORAL_TABLET | Freq: Every day | ORAL | 4 refills | Status: AC

## 2019-10-16 MED ORDER — TRELEGY ELLIPTA 100-62.5-25 MCG/INH IN AEPB: INHALATION_SPRAY | RESPIRATORY_TRACT | 3 refills | Status: AC

## 2019-10-24 DIAGNOSIS — J9622 Acute and chronic respiratory failure with hypercapnia: Secondary | ICD-10-CM | POA: Diagnosis not present

## 2019-10-24 DIAGNOSIS — J449 Chronic obstructive pulmonary disease, unspecified: Secondary | ICD-10-CM | POA: Diagnosis not present

## 2019-11-03 ENCOUNTER — Telehealth: Payer: Self-pay | Admitting: Primary Care

## 2019-11-03 DIAGNOSIS — J9621 Acute and chronic respiratory failure with hypoxia: Secondary | ICD-10-CM | POA: Diagnosis not present

## 2019-11-03 DIAGNOSIS — I110 Hypertensive heart disease with heart failure: Secondary | ICD-10-CM | POA: Diagnosis not present

## 2019-11-03 DIAGNOSIS — S199XXA Unspecified injury of neck, initial encounter: Secondary | ICD-10-CM | POA: Diagnosis not present

## 2019-11-03 DIAGNOSIS — E872 Acidosis: Secondary | ICD-10-CM | POA: Diagnosis not present

## 2019-11-03 DIAGNOSIS — J189 Pneumonia, unspecified organism: Secondary | ICD-10-CM | POA: Diagnosis not present

## 2019-11-03 DIAGNOSIS — R4020 Unspecified coma: Secondary | ICD-10-CM | POA: Diagnosis not present

## 2019-11-03 DIAGNOSIS — Z9981 Dependence on supplemental oxygen: Secondary | ICD-10-CM | POA: Diagnosis not present

## 2019-11-03 DIAGNOSIS — J441 Chronic obstructive pulmonary disease with (acute) exacerbation: Secondary | ICD-10-CM | POA: Diagnosis not present

## 2019-11-03 DIAGNOSIS — R609 Edema, unspecified: Secondary | ICD-10-CM | POA: Diagnosis not present

## 2019-11-03 DIAGNOSIS — D696 Thrombocytopenia, unspecified: Secondary | ICD-10-CM | POA: Diagnosis not present

## 2019-11-03 DIAGNOSIS — I5032 Chronic diastolic (congestive) heart failure: Secondary | ICD-10-CM | POA: Diagnosis not present

## 2019-11-03 DIAGNOSIS — Z20822 Contact with and (suspected) exposure to covid-19: Secondary | ICD-10-CM | POA: Diagnosis not present

## 2019-11-03 DIAGNOSIS — M109 Gout, unspecified: Secondary | ICD-10-CM | POA: Diagnosis not present

## 2019-11-03 DIAGNOSIS — Z66 Do not resuscitate: Secondary | ICD-10-CM | POA: Diagnosis not present

## 2019-11-03 DIAGNOSIS — R0689 Other abnormalities of breathing: Secondary | ICD-10-CM | POA: Diagnosis not present

## 2019-11-03 DIAGNOSIS — F17210 Nicotine dependence, cigarettes, uncomplicated: Secondary | ICD-10-CM | POA: Diagnosis not present

## 2019-11-03 DIAGNOSIS — E860 Dehydration: Secondary | ICD-10-CM | POA: Diagnosis not present

## 2019-11-03 DIAGNOSIS — R0902 Hypoxemia: Secondary | ICD-10-CM | POA: Diagnosis not present

## 2019-11-03 DIAGNOSIS — I959 Hypotension, unspecified: Secondary | ICD-10-CM | POA: Diagnosis not present

## 2019-11-03 DIAGNOSIS — E46 Unspecified protein-calorie malnutrition: Secondary | ICD-10-CM | POA: Diagnosis not present

## 2019-11-03 DIAGNOSIS — M25461 Effusion, right knee: Secondary | ICD-10-CM | POA: Diagnosis not present

## 2019-11-03 DIAGNOSIS — I4510 Unspecified right bundle-branch block: Secondary | ICD-10-CM | POA: Diagnosis not present

## 2019-11-03 DIAGNOSIS — R069 Unspecified abnormalities of breathing: Secondary | ICD-10-CM | POA: Diagnosis not present

## 2019-11-03 DIAGNOSIS — J9622 Acute and chronic respiratory failure with hypercapnia: Secondary | ICD-10-CM | POA: Diagnosis not present

## 2019-11-03 DIAGNOSIS — S0990XA Unspecified injury of head, initial encounter: Secondary | ICD-10-CM | POA: Diagnosis not present

## 2019-11-03 DIAGNOSIS — K219 Gastro-esophageal reflux disease without esophagitis: Secondary | ICD-10-CM | POA: Diagnosis not present

## 2019-11-03 DIAGNOSIS — Z515 Encounter for palliative care: Secondary | ICD-10-CM | POA: Diagnosis not present

## 2019-11-03 DIAGNOSIS — R05 Cough: Secondary | ICD-10-CM | POA: Diagnosis not present

## 2019-11-03 DIAGNOSIS — E875 Hyperkalemia: Secondary | ICD-10-CM | POA: Diagnosis not present

## 2019-11-03 DIAGNOSIS — R55 Syncope and collapse: Secondary | ICD-10-CM | POA: Diagnosis not present

## 2019-11-03 DIAGNOSIS — I2510 Atherosclerotic heart disease of native coronary artery without angina pectoris: Secondary | ICD-10-CM | POA: Diagnosis not present

## 2019-11-03 DIAGNOSIS — I350 Nonrheumatic aortic (valve) stenosis: Secondary | ICD-10-CM | POA: Diagnosis not present

## 2019-11-03 DIAGNOSIS — J449 Chronic obstructive pulmonary disease, unspecified: Secondary | ICD-10-CM | POA: Diagnosis not present

## 2019-11-03 DIAGNOSIS — Z79899 Other long term (current) drug therapy: Secondary | ICD-10-CM | POA: Diagnosis not present

## 2019-11-03 DIAGNOSIS — J431 Panlobular emphysema: Secondary | ICD-10-CM | POA: Diagnosis not present

## 2019-11-03 NOTE — Telephone Encounter (Signed)
T/c from wife on voice mail from weekend that patient needs to be checked. Called back this AM to find he went to [facility] ED past pm after falling. Tc returned to wife. Liaison team notified.

## 2019-11-04 DIAGNOSIS — I110 Hypertensive heart disease with heart failure: Secondary | ICD-10-CM | POA: Diagnosis not present

## 2019-11-04 DIAGNOSIS — J441 Chronic obstructive pulmonary disease with (acute) exacerbation: Secondary | ICD-10-CM | POA: Diagnosis not present

## 2019-11-04 DIAGNOSIS — J9621 Acute and chronic respiratory failure with hypoxia: Secondary | ICD-10-CM | POA: Diagnosis not present

## 2019-11-04 DIAGNOSIS — J9622 Acute and chronic respiratory failure with hypercapnia: Secondary | ICD-10-CM | POA: Diagnosis not present

## 2019-11-04 DIAGNOSIS — I5032 Chronic diastolic (congestive) heart failure: Secondary | ICD-10-CM | POA: Diagnosis not present

## 2019-11-05 DIAGNOSIS — I5032 Chronic diastolic (congestive) heart failure: Secondary | ICD-10-CM | POA: Diagnosis not present

## 2019-11-05 DIAGNOSIS — J449 Chronic obstructive pulmonary disease, unspecified: Secondary | ICD-10-CM | POA: Diagnosis not present

## 2019-11-05 DIAGNOSIS — J9622 Acute and chronic respiratory failure with hypercapnia: Secondary | ICD-10-CM | POA: Diagnosis not present

## 2019-11-05 DIAGNOSIS — I110 Hypertensive heart disease with heart failure: Secondary | ICD-10-CM | POA: Diagnosis not present

## 2019-11-05 DIAGNOSIS — J9621 Acute and chronic respiratory failure with hypoxia: Secondary | ICD-10-CM | POA: Diagnosis not present

## 2019-11-06 DIAGNOSIS — J9621 Acute and chronic respiratory failure with hypoxia: Secondary | ICD-10-CM | POA: Diagnosis not present

## 2019-11-06 DIAGNOSIS — J449 Chronic obstructive pulmonary disease, unspecified: Secondary | ICD-10-CM | POA: Diagnosis not present

## 2019-11-06 DIAGNOSIS — I5032 Chronic diastolic (congestive) heart failure: Secondary | ICD-10-CM | POA: Diagnosis not present

## 2019-11-06 DIAGNOSIS — J9622 Acute and chronic respiratory failure with hypercapnia: Secondary | ICD-10-CM | POA: Diagnosis not present

## 2019-11-06 DIAGNOSIS — I110 Hypertensive heart disease with heart failure: Secondary | ICD-10-CM | POA: Diagnosis not present

## 2019-11-11 ENCOUNTER — Ambulatory Visit: Payer: Medicare HMO | Admitting: Nurse Practitioner

## 2019-11-17 ENCOUNTER — Ambulatory Visit: Payer: Medicare Other | Admitting: Nurse Practitioner

## 2019-11-17 DIAGNOSIS — E785 Hyperlipidemia, unspecified: Secondary | ICD-10-CM | POA: Diagnosis not present

## 2019-11-17 DIAGNOSIS — I5032 Chronic diastolic (congestive) heart failure: Secondary | ICD-10-CM | POA: Diagnosis not present

## 2019-11-17 DIAGNOSIS — Z8673 Personal history of transient ischemic attack (TIA), and cerebral infarction without residual deficits: Secondary | ICD-10-CM | POA: Diagnosis not present

## 2019-11-17 DIAGNOSIS — Z7982 Long term (current) use of aspirin: Secondary | ICD-10-CM | POA: Diagnosis not present

## 2019-11-17 DIAGNOSIS — Z7951 Long term (current) use of inhaled steroids: Secondary | ICD-10-CM | POA: Diagnosis not present

## 2019-11-17 DIAGNOSIS — I350 Nonrheumatic aortic (valve) stenosis: Secondary | ICD-10-CM | POA: Diagnosis not present

## 2019-11-17 DIAGNOSIS — Z515 Encounter for palliative care: Secondary | ICD-10-CM | POA: Diagnosis not present

## 2019-11-17 DIAGNOSIS — I272 Pulmonary hypertension, unspecified: Secondary | ICD-10-CM | POA: Diagnosis not present

## 2019-11-17 DIAGNOSIS — E872 Acidosis: Secondary | ICD-10-CM | POA: Diagnosis not present

## 2019-11-17 DIAGNOSIS — G9341 Metabolic encephalopathy: Secondary | ICD-10-CM | POA: Diagnosis not present

## 2019-11-17 DIAGNOSIS — I110 Hypertensive heart disease with heart failure: Secondary | ICD-10-CM | POA: Diagnosis not present

## 2019-11-17 DIAGNOSIS — J690 Pneumonitis due to inhalation of food and vomit: Secondary | ICD-10-CM | POA: Diagnosis not present

## 2019-11-17 DIAGNOSIS — Z20822 Contact with and (suspected) exposure to covid-19: Secondary | ICD-10-CM | POA: Diagnosis not present

## 2019-11-17 DIAGNOSIS — E875 Hyperkalemia: Secondary | ICD-10-CM | POA: Diagnosis not present

## 2019-11-17 DIAGNOSIS — J441 Chronic obstructive pulmonary disease with (acute) exacerbation: Secondary | ICD-10-CM | POA: Diagnosis not present

## 2019-11-17 DIAGNOSIS — J9602 Acute respiratory failure with hypercapnia: Secondary | ICD-10-CM | POA: Diagnosis not present

## 2019-11-17 DIAGNOSIS — J90 Pleural effusion, not elsewhere classified: Secondary | ICD-10-CM | POA: Diagnosis not present

## 2019-11-17 DIAGNOSIS — D696 Thrombocytopenia, unspecified: Secondary | ICD-10-CM | POA: Diagnosis not present

## 2019-11-17 DIAGNOSIS — D638 Anemia in other chronic diseases classified elsewhere: Secondary | ICD-10-CM | POA: Diagnosis not present

## 2019-11-17 DIAGNOSIS — Z87891 Personal history of nicotine dependence: Secondary | ICD-10-CM | POA: Diagnosis not present

## 2019-11-17 DIAGNOSIS — R918 Other nonspecific abnormal finding of lung field: Secondary | ICD-10-CM | POA: Diagnosis not present

## 2019-11-17 DIAGNOSIS — J431 Panlobular emphysema: Secondary | ICD-10-CM | POA: Diagnosis not present

## 2019-11-17 DIAGNOSIS — Z9981 Dependence on supplemental oxygen: Secondary | ICD-10-CM | POA: Diagnosis not present

## 2019-11-17 DIAGNOSIS — J9622 Acute and chronic respiratory failure with hypercapnia: Secondary | ICD-10-CM | POA: Diagnosis not present

## 2019-11-17 DIAGNOSIS — R531 Weakness: Secondary | ICD-10-CM | POA: Diagnosis not present

## 2019-11-17 DIAGNOSIS — I2510 Atherosclerotic heart disease of native coronary artery without angina pectoris: Secondary | ICD-10-CM | POA: Diagnosis not present

## 2019-11-17 DIAGNOSIS — J9601 Acute respiratory failure with hypoxia: Secondary | ICD-10-CM | POA: Diagnosis not present

## 2019-11-17 DIAGNOSIS — R05 Cough: Secondary | ICD-10-CM | POA: Diagnosis not present

## 2019-11-17 DIAGNOSIS — J9621 Acute and chronic respiratory failure with hypoxia: Secondary | ICD-10-CM | POA: Diagnosis not present

## 2019-11-17 DIAGNOSIS — Z66 Do not resuscitate: Secondary | ICD-10-CM | POA: Diagnosis not present

## 2019-11-17 DIAGNOSIS — K5900 Constipation, unspecified: Secondary | ICD-10-CM | POA: Diagnosis not present

## 2019-11-23 DIAGNOSIS — J449 Chronic obstructive pulmonary disease, unspecified: Secondary | ICD-10-CM | POA: Diagnosis not present

## 2019-11-23 DIAGNOSIS — Z8673 Personal history of transient ischemic attack (TIA), and cerebral infarction without residual deficits: Secondary | ICD-10-CM | POA: Diagnosis not present

## 2019-11-23 DIAGNOSIS — I350 Nonrheumatic aortic (valve) stenosis: Secondary | ICD-10-CM | POA: Diagnosis not present

## 2019-11-23 DIAGNOSIS — E785 Hyperlipidemia, unspecified: Secondary | ICD-10-CM | POA: Diagnosis not present

## 2019-11-23 DIAGNOSIS — I272 Pulmonary hypertension, unspecified: Secondary | ICD-10-CM | POA: Diagnosis not present

## 2019-11-23 DIAGNOSIS — J9622 Acute and chronic respiratory failure with hypercapnia: Secondary | ICD-10-CM | POA: Diagnosis not present

## 2019-11-23 DIAGNOSIS — I5032 Chronic diastolic (congestive) heart failure: Secondary | ICD-10-CM | POA: Diagnosis not present

## 2019-11-23 DIAGNOSIS — D693 Immune thrombocytopenic purpura: Secondary | ICD-10-CM | POA: Diagnosis not present

## 2019-11-23 DIAGNOSIS — Z7982 Long term (current) use of aspirin: Secondary | ICD-10-CM | POA: Diagnosis not present

## 2019-11-23 DIAGNOSIS — J9621 Acute and chronic respiratory failure with hypoxia: Secondary | ICD-10-CM | POA: Diagnosis not present

## 2019-11-23 DIAGNOSIS — R64 Cachexia: Secondary | ICD-10-CM | POA: Diagnosis not present

## 2019-11-23 DIAGNOSIS — I110 Hypertensive heart disease with heart failure: Secondary | ICD-10-CM | POA: Diagnosis not present

## 2019-11-23 DIAGNOSIS — Z9981 Dependence on supplemental oxygen: Secondary | ICD-10-CM | POA: Diagnosis not present

## 2019-11-23 DIAGNOSIS — F17200 Nicotine dependence, unspecified, uncomplicated: Secondary | ICD-10-CM | POA: Diagnosis not present

## 2019-11-23 DIAGNOSIS — I2510 Atherosclerotic heart disease of native coronary artery without angina pectoris: Secondary | ICD-10-CM | POA: Diagnosis not present

## 2019-11-24 DIAGNOSIS — J449 Chronic obstructive pulmonary disease, unspecified: Secondary | ICD-10-CM | POA: Diagnosis not present

## 2019-11-24 DIAGNOSIS — J9622 Acute and chronic respiratory failure with hypercapnia: Secondary | ICD-10-CM | POA: Diagnosis not present

## 2019-11-26 DIAGNOSIS — Z9981 Dependence on supplemental oxygen: Secondary | ICD-10-CM | POA: Diagnosis not present

## 2019-11-26 DIAGNOSIS — I110 Hypertensive heart disease with heart failure: Secondary | ICD-10-CM | POA: Diagnosis not present

## 2019-11-26 DIAGNOSIS — I350 Nonrheumatic aortic (valve) stenosis: Secondary | ICD-10-CM | POA: Diagnosis not present

## 2019-11-26 DIAGNOSIS — J449 Chronic obstructive pulmonary disease, unspecified: Secondary | ICD-10-CM | POA: Diagnosis not present

## 2019-11-26 DIAGNOSIS — Z7982 Long term (current) use of aspirin: Secondary | ICD-10-CM | POA: Diagnosis not present

## 2019-11-26 DIAGNOSIS — I272 Pulmonary hypertension, unspecified: Secondary | ICD-10-CM | POA: Diagnosis not present

## 2019-11-26 DIAGNOSIS — J9621 Acute and chronic respiratory failure with hypoxia: Secondary | ICD-10-CM | POA: Diagnosis not present

## 2019-11-26 DIAGNOSIS — R64 Cachexia: Secondary | ICD-10-CM | POA: Diagnosis not present

## 2019-11-26 DIAGNOSIS — I2510 Atherosclerotic heart disease of native coronary artery without angina pectoris: Secondary | ICD-10-CM | POA: Diagnosis not present

## 2019-11-26 DIAGNOSIS — I5032 Chronic diastolic (congestive) heart failure: Secondary | ICD-10-CM | POA: Diagnosis not present

## 2019-11-26 DIAGNOSIS — D693 Immune thrombocytopenic purpura: Secondary | ICD-10-CM | POA: Diagnosis not present

## 2019-11-26 DIAGNOSIS — J9622 Acute and chronic respiratory failure with hypercapnia: Secondary | ICD-10-CM | POA: Diagnosis not present

## 2019-11-26 DIAGNOSIS — Z8673 Personal history of transient ischemic attack (TIA), and cerebral infarction without residual deficits: Secondary | ICD-10-CM | POA: Diagnosis not present

## 2019-11-26 DIAGNOSIS — F17200 Nicotine dependence, unspecified, uncomplicated: Secondary | ICD-10-CM | POA: Diagnosis not present

## 2019-11-26 DIAGNOSIS — E785 Hyperlipidemia, unspecified: Secondary | ICD-10-CM | POA: Diagnosis not present

## 2019-12-03 ENCOUNTER — Telehealth: Payer: Self-pay | Admitting: Primary Care

## 2019-12-03 NOTE — Telephone Encounter (Signed)
T/c to schedule a home palliative visit. No answer, message left.

## 2019-12-08 ENCOUNTER — Telehealth: Payer: Self-pay

## 2019-12-08 NOTE — Telephone Encounter (Signed)
Phone call placed to patient to check in and offer to schedule a follow up visit with Palliative care. VM left

## 2019-12-09 DIAGNOSIS — R911 Solitary pulmonary nodule: Secondary | ICD-10-CM | POA: Diagnosis not present

## 2019-12-09 DIAGNOSIS — Z7982 Long term (current) use of aspirin: Secondary | ICD-10-CM | POA: Diagnosis not present

## 2019-12-09 DIAGNOSIS — Z7951 Long term (current) use of inhaled steroids: Secondary | ICD-10-CM | POA: Diagnosis not present

## 2019-12-09 DIAGNOSIS — J439 Emphysema, unspecified: Secondary | ICD-10-CM | POA: Diagnosis not present

## 2019-12-09 DIAGNOSIS — Z79899 Other long term (current) drug therapy: Secondary | ICD-10-CM | POA: Diagnosis not present

## 2019-12-09 DIAGNOSIS — J9611 Chronic respiratory failure with hypoxia: Secondary | ICD-10-CM | POA: Diagnosis not present

## 2019-12-09 DIAGNOSIS — F17210 Nicotine dependence, cigarettes, uncomplicated: Secondary | ICD-10-CM | POA: Diagnosis not present

## 2019-12-09 DIAGNOSIS — I2722 Pulmonary hypertension due to left heart disease: Secondary | ICD-10-CM | POA: Diagnosis not present

## 2019-12-09 DIAGNOSIS — R0609 Other forms of dyspnea: Secondary | ICD-10-CM | POA: Diagnosis not present

## 2019-12-09 DIAGNOSIS — K219 Gastro-esophageal reflux disease without esophagitis: Secondary | ICD-10-CM | POA: Diagnosis not present

## 2019-12-09 DIAGNOSIS — Z8719 Personal history of other diseases of the digestive system: Secondary | ICD-10-CM | POA: Diagnosis not present

## 2019-12-12 DIAGNOSIS — D693 Immune thrombocytopenic purpura: Secondary | ICD-10-CM | POA: Diagnosis not present

## 2019-12-12 DIAGNOSIS — I5032 Chronic diastolic (congestive) heart failure: Secondary | ICD-10-CM | POA: Diagnosis not present

## 2019-12-12 DIAGNOSIS — J9622 Acute and chronic respiratory failure with hypercapnia: Secondary | ICD-10-CM | POA: Diagnosis not present

## 2019-12-12 DIAGNOSIS — Z8673 Personal history of transient ischemic attack (TIA), and cerebral infarction without residual deficits: Secondary | ICD-10-CM | POA: Diagnosis not present

## 2019-12-12 DIAGNOSIS — Z7982 Long term (current) use of aspirin: Secondary | ICD-10-CM | POA: Diagnosis not present

## 2019-12-12 DIAGNOSIS — I272 Pulmonary hypertension, unspecified: Secondary | ICD-10-CM | POA: Diagnosis not present

## 2019-12-12 DIAGNOSIS — F17200 Nicotine dependence, unspecified, uncomplicated: Secondary | ICD-10-CM | POA: Diagnosis not present

## 2019-12-12 DIAGNOSIS — I110 Hypertensive heart disease with heart failure: Secondary | ICD-10-CM | POA: Diagnosis not present

## 2019-12-12 DIAGNOSIS — R64 Cachexia: Secondary | ICD-10-CM | POA: Diagnosis not present

## 2019-12-12 DIAGNOSIS — J9621 Acute and chronic respiratory failure with hypoxia: Secondary | ICD-10-CM | POA: Diagnosis not present

## 2019-12-12 DIAGNOSIS — Z9981 Dependence on supplemental oxygen: Secondary | ICD-10-CM | POA: Diagnosis not present

## 2019-12-12 DIAGNOSIS — I2510 Atherosclerotic heart disease of native coronary artery without angina pectoris: Secondary | ICD-10-CM | POA: Diagnosis not present

## 2019-12-12 DIAGNOSIS — E785 Hyperlipidemia, unspecified: Secondary | ICD-10-CM | POA: Diagnosis not present

## 2019-12-12 DIAGNOSIS — J449 Chronic obstructive pulmonary disease, unspecified: Secondary | ICD-10-CM | POA: Diagnosis not present

## 2019-12-12 DIAGNOSIS — I350 Nonrheumatic aortic (valve) stenosis: Secondary | ICD-10-CM | POA: Diagnosis not present

## 2019-12-23 ENCOUNTER — Ambulatory Visit: Payer: Medicare Other | Admitting: Nurse Practitioner

## 2019-12-25 DIAGNOSIS — J9622 Acute and chronic respiratory failure with hypercapnia: Secondary | ICD-10-CM | POA: Diagnosis not present

## 2019-12-25 DIAGNOSIS — J449 Chronic obstructive pulmonary disease, unspecified: Secondary | ICD-10-CM | POA: Diagnosis not present

## 2020-01-02 DIAGNOSIS — F17210 Nicotine dependence, cigarettes, uncomplicated: Secondary | ICD-10-CM | POA: Diagnosis not present

## 2020-01-02 DIAGNOSIS — I5032 Chronic diastolic (congestive) heart failure: Secondary | ICD-10-CM | POA: Diagnosis not present

## 2020-01-02 DIAGNOSIS — Z79899 Other long term (current) drug therapy: Secondary | ICD-10-CM | POA: Diagnosis not present

## 2020-01-02 DIAGNOSIS — J449 Chronic obstructive pulmonary disease, unspecified: Secondary | ICD-10-CM | POA: Diagnosis not present

## 2020-01-02 DIAGNOSIS — I272 Pulmonary hypertension, unspecified: Secondary | ICD-10-CM | POA: Diagnosis not present

## 2020-01-02 DIAGNOSIS — J9601 Acute respiratory failure with hypoxia: Secondary | ICD-10-CM | POA: Diagnosis not present

## 2020-01-02 DIAGNOSIS — J9622 Acute and chronic respiratory failure with hypercapnia: Secondary | ICD-10-CM | POA: Diagnosis not present

## 2020-01-02 DIAGNOSIS — J441 Chronic obstructive pulmonary disease with (acute) exacerbation: Secondary | ICD-10-CM | POA: Diagnosis not present

## 2020-01-02 DIAGNOSIS — K219 Gastro-esophageal reflux disease without esophagitis: Secondary | ICD-10-CM | POA: Diagnosis not present

## 2020-01-02 DIAGNOSIS — Z8673 Personal history of transient ischemic attack (TIA), and cerebral infarction without residual deficits: Secondary | ICD-10-CM | POA: Diagnosis not present

## 2020-01-02 DIAGNOSIS — J9602 Acute respiratory failure with hypercapnia: Secondary | ICD-10-CM | POA: Diagnosis not present

## 2020-01-02 DIAGNOSIS — Z66 Do not resuscitate: Secondary | ICD-10-CM | POA: Diagnosis not present

## 2020-01-02 DIAGNOSIS — Z7982 Long term (current) use of aspirin: Secondary | ICD-10-CM | POA: Diagnosis not present

## 2020-01-02 DIAGNOSIS — D696 Thrombocytopenia, unspecified: Secondary | ICD-10-CM | POA: Diagnosis not present

## 2020-01-02 DIAGNOSIS — R0902 Hypoxemia: Secondary | ICD-10-CM | POA: Diagnosis not present

## 2020-01-02 DIAGNOSIS — J9621 Acute and chronic respiratory failure with hypoxia: Secondary | ICD-10-CM | POA: Diagnosis not present

## 2020-01-02 DIAGNOSIS — I110 Hypertensive heart disease with heart failure: Secondary | ICD-10-CM | POA: Diagnosis not present

## 2020-01-02 DIAGNOSIS — I509 Heart failure, unspecified: Secondary | ICD-10-CM | POA: Diagnosis not present

## 2020-01-02 DIAGNOSIS — D649 Anemia, unspecified: Secondary | ICD-10-CM | POA: Diagnosis not present

## 2020-01-02 DIAGNOSIS — E872 Acidosis: Secondary | ICD-10-CM | POA: Diagnosis not present

## 2020-01-02 DIAGNOSIS — Z7951 Long term (current) use of inhaled steroids: Secondary | ICD-10-CM | POA: Diagnosis not present

## 2020-01-17 DIAGNOSIS — I110 Hypertensive heart disease with heart failure: Secondary | ICD-10-CM | POA: Diagnosis not present

## 2020-01-17 DIAGNOSIS — J9601 Acute respiratory failure with hypoxia: Secondary | ICD-10-CM | POA: Diagnosis not present

## 2020-01-17 DIAGNOSIS — I959 Hypotension, unspecified: Secondary | ICD-10-CM | POA: Diagnosis not present

## 2020-01-17 DIAGNOSIS — Z8673 Personal history of transient ischemic attack (TIA), and cerebral infarction without residual deficits: Secondary | ICD-10-CM | POA: Diagnosis not present

## 2020-01-17 DIAGNOSIS — E785 Hyperlipidemia, unspecified: Secondary | ICD-10-CM | POA: Diagnosis not present

## 2020-01-17 DIAGNOSIS — R918 Other nonspecific abnormal finding of lung field: Secondary | ICD-10-CM | POA: Diagnosis not present

## 2020-01-17 DIAGNOSIS — D696 Thrombocytopenia, unspecified: Secondary | ICD-10-CM | POA: Diagnosis not present

## 2020-01-17 DIAGNOSIS — Z8669 Personal history of other diseases of the nervous system and sense organs: Secondary | ICD-10-CM | POA: Diagnosis not present

## 2020-01-17 DIAGNOSIS — K219 Gastro-esophageal reflux disease without esophagitis: Secondary | ICD-10-CM | POA: Diagnosis not present

## 2020-01-17 DIAGNOSIS — I517 Cardiomegaly: Secondary | ICD-10-CM | POA: Diagnosis not present

## 2020-01-17 DIAGNOSIS — H748X2 Other specified disorders of left middle ear and mastoid: Secondary | ICD-10-CM | POA: Diagnosis not present

## 2020-01-17 DIAGNOSIS — J449 Chronic obstructive pulmonary disease, unspecified: Secondary | ICD-10-CM | POA: Diagnosis not present

## 2020-01-17 DIAGNOSIS — E872 Acidosis: Secondary | ICD-10-CM | POA: Diagnosis not present

## 2020-01-17 DIAGNOSIS — S50812A Abrasion of left forearm, initial encounter: Secondary | ICD-10-CM | POA: Diagnosis not present

## 2020-01-17 DIAGNOSIS — G4737 Central sleep apnea in conditions classified elsewhere: Secondary | ICD-10-CM | POA: Diagnosis not present

## 2020-01-17 DIAGNOSIS — R5383 Other fatigue: Secondary | ICD-10-CM | POA: Diagnosis not present

## 2020-01-17 DIAGNOSIS — S51802A Unspecified open wound of left forearm, initial encounter: Secondary | ICD-10-CM | POA: Diagnosis not present

## 2020-01-17 DIAGNOSIS — I5032 Chronic diastolic (congestive) heart failure: Secondary | ICD-10-CM | POA: Diagnosis not present

## 2020-01-17 DIAGNOSIS — Z7951 Long term (current) use of inhaled steroids: Secondary | ICD-10-CM | POA: Diagnosis not present

## 2020-01-17 DIAGNOSIS — Z043 Encounter for examination and observation following other accident: Secondary | ICD-10-CM | POA: Diagnosis not present

## 2020-01-17 DIAGNOSIS — J9622 Acute and chronic respiratory failure with hypercapnia: Secondary | ICD-10-CM | POA: Diagnosis not present

## 2020-01-17 DIAGNOSIS — Z9981 Dependence on supplemental oxygen: Secondary | ICD-10-CM | POA: Diagnosis not present

## 2020-01-17 DIAGNOSIS — Z0389 Encounter for observation for other suspected diseases and conditions ruled out: Secondary | ICD-10-CM | POA: Diagnosis not present

## 2020-01-17 DIAGNOSIS — I700 Atherosclerosis of aorta: Secondary | ICD-10-CM | POA: Diagnosis not present

## 2020-01-17 DIAGNOSIS — I350 Nonrheumatic aortic (valve) stenosis: Secondary | ICD-10-CM | POA: Diagnosis not present

## 2020-01-17 DIAGNOSIS — D649 Anemia, unspecified: Secondary | ICD-10-CM | POA: Diagnosis not present

## 2020-01-17 DIAGNOSIS — I272 Pulmonary hypertension, unspecified: Secondary | ICD-10-CM | POA: Diagnosis not present

## 2020-01-17 DIAGNOSIS — Z79899 Other long term (current) drug therapy: Secondary | ICD-10-CM | POA: Diagnosis not present

## 2020-01-17 DIAGNOSIS — J9621 Acute and chronic respiratory failure with hypoxia: Secondary | ICD-10-CM | POA: Diagnosis not present

## 2020-01-17 DIAGNOSIS — F17210 Nicotine dependence, cigarettes, uncomplicated: Secondary | ICD-10-CM | POA: Diagnosis not present

## 2020-01-17 DIAGNOSIS — M47812 Spondylosis without myelopathy or radiculopathy, cervical region: Secondary | ICD-10-CM | POA: Diagnosis not present

## 2020-01-17 DIAGNOSIS — I4510 Unspecified right bundle-branch block: Secondary | ICD-10-CM | POA: Diagnosis not present

## 2020-01-17 DIAGNOSIS — J439 Emphysema, unspecified: Secondary | ICD-10-CM | POA: Diagnosis not present

## 2020-01-17 DIAGNOSIS — J9602 Acute respiratory failure with hypercapnia: Secondary | ICD-10-CM | POA: Diagnosis not present

## 2020-01-18 DIAGNOSIS — J9621 Acute and chronic respiratory failure with hypoxia: Secondary | ICD-10-CM | POA: Diagnosis not present

## 2020-01-18 DIAGNOSIS — I959 Hypotension, unspecified: Secondary | ICD-10-CM | POA: Diagnosis not present

## 2020-01-18 DIAGNOSIS — J9601 Acute respiratory failure with hypoxia: Secondary | ICD-10-CM | POA: Diagnosis not present

## 2020-01-18 DIAGNOSIS — J449 Chronic obstructive pulmonary disease, unspecified: Secondary | ICD-10-CM | POA: Diagnosis not present

## 2020-01-18 DIAGNOSIS — I272 Pulmonary hypertension, unspecified: Secondary | ICD-10-CM | POA: Diagnosis not present

## 2020-01-18 DIAGNOSIS — J9602 Acute respiratory failure with hypercapnia: Secondary | ICD-10-CM | POA: Diagnosis not present

## 2020-01-18 DIAGNOSIS — F17210 Nicotine dependence, cigarettes, uncomplicated: Secondary | ICD-10-CM | POA: Diagnosis not present

## 2020-01-18 DIAGNOSIS — E872 Acidosis: Secondary | ICD-10-CM | POA: Diagnosis not present

## 2020-01-18 DIAGNOSIS — J9622 Acute and chronic respiratory failure with hypercapnia: Secondary | ICD-10-CM | POA: Diagnosis not present

## 2020-01-19 DIAGNOSIS — F17210 Nicotine dependence, cigarettes, uncomplicated: Secondary | ICD-10-CM | POA: Diagnosis not present

## 2020-01-19 DIAGNOSIS — J9622 Acute and chronic respiratory failure with hypercapnia: Secondary | ICD-10-CM | POA: Diagnosis not present

## 2020-01-19 DIAGNOSIS — D649 Anemia, unspecified: Secondary | ICD-10-CM | POA: Diagnosis not present

## 2020-01-19 DIAGNOSIS — J9621 Acute and chronic respiratory failure with hypoxia: Secondary | ICD-10-CM | POA: Diagnosis not present

## 2020-01-19 DIAGNOSIS — I272 Pulmonary hypertension, unspecified: Secondary | ICD-10-CM | POA: Diagnosis not present

## 2020-01-19 DIAGNOSIS — I517 Cardiomegaly: Secondary | ICD-10-CM | POA: Diagnosis not present

## 2020-01-19 DIAGNOSIS — I959 Hypotension, unspecified: Secondary | ICD-10-CM | POA: Diagnosis not present

## 2020-01-19 DIAGNOSIS — J449 Chronic obstructive pulmonary disease, unspecified: Secondary | ICD-10-CM | POA: Diagnosis not present

## 2020-01-19 DIAGNOSIS — E872 Acidosis: Secondary | ICD-10-CM | POA: Diagnosis not present

## 2020-01-19 DIAGNOSIS — I350 Nonrheumatic aortic (valve) stenosis: Secondary | ICD-10-CM | POA: Diagnosis not present

## 2020-01-20 DIAGNOSIS — J9622 Acute and chronic respiratory failure with hypercapnia: Secondary | ICD-10-CM | POA: Diagnosis not present

## 2020-01-20 DIAGNOSIS — I959 Hypotension, unspecified: Secondary | ICD-10-CM | POA: Diagnosis not present

## 2020-01-20 DIAGNOSIS — G4737 Central sleep apnea in conditions classified elsewhere: Secondary | ICD-10-CM | POA: Diagnosis not present

## 2020-01-20 DIAGNOSIS — J449 Chronic obstructive pulmonary disease, unspecified: Secondary | ICD-10-CM | POA: Diagnosis not present

## 2020-01-20 DIAGNOSIS — D649 Anemia, unspecified: Secondary | ICD-10-CM | POA: Diagnosis not present

## 2020-01-20 DIAGNOSIS — I272 Pulmonary hypertension, unspecified: Secondary | ICD-10-CM | POA: Diagnosis not present

## 2020-01-20 DIAGNOSIS — F17210 Nicotine dependence, cigarettes, uncomplicated: Secondary | ICD-10-CM | POA: Diagnosis not present

## 2020-01-20 DIAGNOSIS — J9621 Acute and chronic respiratory failure with hypoxia: Secondary | ICD-10-CM | POA: Diagnosis not present

## 2020-01-24 DIAGNOSIS — J449 Chronic obstructive pulmonary disease, unspecified: Secondary | ICD-10-CM | POA: Diagnosis not present

## 2020-01-24 DIAGNOSIS — J9622 Acute and chronic respiratory failure with hypercapnia: Secondary | ICD-10-CM | POA: Diagnosis not present

## 2020-02-03 ENCOUNTER — Inpatient Hospital Stay: Payer: Medicare Other | Admitting: Nurse Practitioner

## 2020-02-09 ENCOUNTER — Telehealth: Payer: Self-pay | Admitting: Primary Care

## 2020-02-09 NOTE — Telephone Encounter (Signed)
T/c to wife's number to schedule patient visit. Message left.

## 2020-02-10 ENCOUNTER — Other Ambulatory Visit: Payer: Self-pay | Admitting: Nurse Practitioner

## 2020-02-10 MED ORDER — AMLODIPINE BESYLATE 10 MG PO TABS: 10.0000 mg | ORAL_TABLET | Freq: Every day | ORAL | 0 refills | Status: DC

## 2020-02-10 MED ORDER — TAMSULOSIN HCL 0.4 MG PO CAPS
0.4000 mg | ORAL_CAPSULE | Freq: Every day | ORAL | 0 refills | Status: DC
Start: 2020-02-10 — End: 2020-02-13

## 2020-02-10 NOTE — Telephone Encounter (Signed)
Copied from [place] [id number]. Topic: Quick Communication - Rx Refill/Question >> Feb 10, 2020  1:14 PM Leward Quan A wrote: Medication: tamsulosin (FLOMAX) 0.4 MG CAPS capsule  Has the patient contacted their pharmacy? Yes.   (Agent: If no, request that the patient contact the pharmacy for the refill.) (Agent: If yes, when and what did the pharmacy advise?)  Preferred Pharmacy (with phone number or street name): [id number] [place] [address]  Phone:  [phone]:  [phone]    Agent: Please be advised that RX refills may take up to 3 business days. We ask that you follow-up with your pharmacy.

## 2020-02-10 NOTE — Telephone Encounter (Signed)
Requested Prescriptions  Pending Prescriptions Disp Refills   amLODipine (NORVASC) 10 MG tablet 10 tablet 0    Sig: Take 1 tablet (10 mg total) by mouth daily.     Cardiovascular:  Calcium Channel Blockers Failed - 02/10/2020  4:42 PM      Failed - Valid encounter within last 6 months    Recent Outpatient Visits          9 months ago COPD, severe ([place])   [place], Henrine Screws T, NP   1 year ago COPD, severe ([place])   [place], Henrine Screws T, NP   1 year ago Aortic stenosis, severe   [facility] [place], Jeannette How, MD   1 year ago Acute on chronic respiratory failure with hypoxia and hypercapnia ([place])   [place], Megan P, DO   1 year ago Eustachian tube dysfunction, left   [employer], [name], NP      Future Appointments            In 3 days [name], [name], NP [employer]   In 3 months  [employer]           Passed - Last BP in normal range    BP Readings from Last 1 Encounters:  05/13/19 116/61         Called and canceled last order with [facility]. Per rep the medication has never been sent to patient. Ordered 10/16/19  #90  3 refills. [facility] Sent 10 day supply.. Patient has appointment scheduled 02/13/20. Last OV 9 months ago.

## 2020-02-10 NOTE — Telephone Encounter (Signed)
Medication Refill - Medication: amLODipine (NORVASC) 10 MG tablet   Has the patient contacted their pharmacy?Yes (Agent: If no, request that the patient contact the pharmacy for the refill.) (Agent: If yes, when and what did the pharmacy advise?)Contact pcp  Preferred Pharmacy (with phone number or street name[id number] [address] Phone:  [phone] Fax:  [phone]      Agent: Please be advised that RX refills may take up to 3 business days. We ask that you follow-up with your pharmacy.

## 2020-02-13 ENCOUNTER — Encounter: Payer: Self-pay | Admitting: Nurse Practitioner

## 2020-02-13 ENCOUNTER — Other Ambulatory Visit: Payer: Self-pay

## 2020-02-13 ENCOUNTER — Ambulatory Visit (INDEPENDENT_AMBULATORY_CARE_PROVIDER_SITE_OTHER): Payer: Medicare Other | Admitting: Nurse Practitioner

## 2020-02-13 VITALS — BP 130/55 | HR 75 | Temp 98.5°F | Ht 69.0 in | Wt 111.8 lb

## 2020-02-13 DIAGNOSIS — I2781 Cor pulmonale (chronic): Secondary | ICD-10-CM | POA: Diagnosis not present

## 2020-02-13 DIAGNOSIS — I700 Atherosclerosis of aorta: Secondary | ICD-10-CM | POA: Diagnosis not present

## 2020-02-13 DIAGNOSIS — I270 Primary pulmonary hypertension: Secondary | ICD-10-CM

## 2020-02-13 DIAGNOSIS — F17219 Nicotine dependence, cigarettes, with unspecified nicotine-induced disorders: Secondary | ICD-10-CM

## 2020-02-13 DIAGNOSIS — R64 Cachexia: Secondary | ICD-10-CM | POA: Diagnosis not present

## 2020-02-13 DIAGNOSIS — I10 Essential (primary) hypertension: Secondary | ICD-10-CM

## 2020-02-13 DIAGNOSIS — J449 Chronic obstructive pulmonary disease, unspecified: Secondary | ICD-10-CM | POA: Diagnosis not present

## 2020-02-13 DIAGNOSIS — E782 Mixed hyperlipidemia: Secondary | ICD-10-CM

## 2020-02-13 MED ORDER — AMLODIPINE BESYLATE 10 MG PO TABS: 10.0000 mg | ORAL_TABLET | Freq: Every day | ORAL | 4 refills | Status: AC

## 2020-02-13 MED ORDER — LOSARTAN POTASSIUM 25 MG PO TABS: 25.0000 mg | ORAL_TABLET | Freq: Every day | ORAL | 4 refills | Status: AC

## 2020-02-13 MED ORDER — TAMSULOSIN HCL 0.4 MG PO CAPS: 0.4000 mg | ORAL_CAPSULE | Freq: Every day | ORAL | 4 refills | Status: AC

## 2020-02-13 MED ORDER — FUROSEMIDE 20 MG PO TABS: ORAL_TABLET | ORAL | 4 refills | Status: AC

## 2020-02-13 MED ORDER — YUPELRI 175 MCG/3ML IN SOLN: 175.0000 ug | Freq: Every day | RESPIRATORY_TRACT | 5 refills | Status: AC

## 2020-02-13 NOTE — Assessment & Plan Note (Signed)
Chronic, severe, dependent on continuous O2.  Continue to collaborate with CCM and palliative team.  Continue current medication regimen and collaboration with pulmonary.

## 2020-02-13 NOTE — Patient Instructions (Signed)
Chronic Obstructive Pulmonary Disease Chronic obstructive pulmonary disease (COPD) is a long-term (chronic) lung problem. When you have COPD, it is hard for air to get in and out of your lungs. Usually the condition gets worse over time, and your lungs will never return to normal. There are things you can do to keep yourself as healthy as possible.  Your doctor may treat your condition with: ? Medicines. ? Oxygen. ? Lung surgery.  Your doctor may also recommend: ? Rehabilitation. This includes steps to make your body work better. It may involve a team of specialists. ? Quitting smoking, if you smoke. ? Exercise and changes to your diet. ? Comfort measures (palliative care). Follow these instructions at home: Medicines  Take over-the-counter and prescription medicines only as told by your doctor.  Talk to your doctor before taking any cough or allergy medicines. You may need to avoid medicines that cause your lungs to be dry. Lifestyle  If you smoke, stop. Smoking makes the problem worse. If you need help quitting, ask your doctor.  Avoid being around things that make your breathing worse. This may include smoke, chemicals, and fumes.  Stay active, but remember to rest as well.  Learn and use tips on how to relax.  Make sure you get enough sleep. Most adults need at least 7 hours of sleep every night.  Eat healthy foods. Eat smaller meals more often. Rest before meals. Controlled breathing Learn and use tips on how to control your breathing as told by your doctor. Try:  Breathing in (inhaling) through your nose for 1 second. Then, pucker your lips and breath out (exhale) through your lips for 2 seconds.  Putting one hand on your belly (abdomen). Breathe in slowly through your nose for 1 second. Your hand on your belly should move out. Pucker your lips and breathe out slowly through your lips. Your hand on your belly should move in as you breathe out.  Controlled coughing Learn  and use controlled coughing to clear mucus from your lungs. Follow these steps: 1. Lean your head a little forward. 2. Breathe in deeply. 3. Try to hold your breath for 3 seconds. 4. Keep your mouth slightly open while coughing 2 times. 5. Spit any mucus out into a tissue. 6. Rest and do the steps again 1 or 2 times as needed. General instructions  Make sure you get all the shots (vaccines) that your doctor recommends. Ask your doctor about a flu shot and a pneumonia shot.  Use oxygen therapy and pulmonary rehabilitation if told by your doctor. If you need home oxygen therapy, ask your doctor if you should buy a tool to measure your oxygen level (oximeter).  Make a COPD action plan with your doctor. This helps you to know what to do if you feel worse than usual.  Manage any other conditions you have as told by your doctor.  Avoid going outside when it is very hot, cold, or humid.  Avoid people who have a sickness you can catch (contagious).  Keep all follow-up visits as told by your doctor. This is important. Contact a doctor if:  You cough up more mucus than usual.  There is a change in the color or thickness of the mucus.  It is harder to breathe than usual.  Your breathing is faster than usual.  You have trouble sleeping.  You need to use your medicines more often than usual.  You have trouble doing your normal activities such as getting dressed   or walking around the house. Get help right away if:  You have shortness of breath while resting.  You have shortness of breath that stops you from: ? Being able to talk. ? Doing normal activities.  Your chest hurts for longer than 5 minutes.  Your skin color is more blue than usual.  Your pulse oximeter shows that you have low oxygen for longer than 5 minutes.  You have a fever.  You feel too tired to breathe normally. Summary  Chronic obstructive pulmonary disease (COPD) is a long-term lung problem.  The way your  lungs work will never return to normal. Usually the condition gets worse over time. There are things you can do to keep yourself as healthy as possible.  Take over-the-counter and prescription medicines only as told by your doctor.  If you smoke, stop. Smoking makes the problem worse. This information is not intended to replace advice given to you by your health care provider. Make sure you discuss any questions you have with your health care provider. Document Revised: 03/09/2017 Document Reviewed: 05/01/2016 Elsevier Patient Education  2020 [employer].

## 2020-02-13 NOTE — Assessment & Plan Note (Signed)
Chronic, ongoing.  Continue current medication regimen and adjust as needed.  Will obtain labs next visit if patient agreeable.

## 2020-02-13 NOTE — Assessment & Plan Note (Signed)
Recommend continuation of supplements, focus on placing them in freezer and taking as ice cream vs shake, which he may prefer.

## 2020-02-13 NOTE — Assessment & Plan Note (Signed)
I have recommended complete cessation of tobacco use. I have discussed various options available for assistance with tobacco cessation including over the counter methods (Nicotine gum, patch and lozenges). We also discussed prescription options (Chantix, Nicotine Inhaler / Nasal Spray). The patient is not interested in pursuing any prescription tobacco cessation options at this time.

## 2020-02-13 NOTE — Progress Notes (Signed)
BP (!) 130/55   Pulse 75   Temp 98.5 F (36.9 C) (Oral)   Ht _0  (1.753 m)   Wt 111 lb 12.8 oz (50.7 kg)   SpO2 96% Comment: with O2  BMI 16.51 kg/m    Subjective:    Patient ID: Patrick Ayala, male    DOB: 1940-08-27, 79 y.o.   MRN: [id number]  HPI: Patrick Ayala is a 79 y.o. male  Chief Complaint  Patient presents with  . COPD    wants handicap placard  . Medication Refill   COPD Followed by [facility] saw them last via telemedicine 12/09/2019.  Has had x 2 exacerbation admissions in past two months at [facility].  Currently smokes about 1-2 cigarettes a day.  Is trying to quit on his own.  COPD status: stable Satisfied with current treatment?: yes Oxygen use: yes -- 4 L Dyspnea frequency: baseline Cough frequency: baseline Rescue inhaler frequency:  Does not have any, ran out Limitation of activity: yes Productive cough: none Last Spirometry: with pulmonary Pneumovax: refused Influenza: refused   HYPERTENSION / HYPERLIPIDEMIA Continues on Lisinopril, Amlodipine, Metoprolol, Lasix, and Lipitor + ASA.  Palliative visit, initial one, on Monday next week.  Has underlying Cor Pulmonale, followed by cardiology, no recent visit. Satisfied with current treatment? yes Duration of hypertension: chronic BP monitoring frequency: not checking BP range:  BP medication side effects: no Duration of hyperlipidemia: chronic Cholesterol medication side effects: no Cholesterol supplements: none Medication compliance: good compliance Aspirin: no Recent stressors: no Recurrent headaches: no Visual changes: no Palpitations: no Dyspnea: at baseline Chest pain: no Lower extremity edema: no Dizzy/lightheaded: no  Relevant past medical, surgical, family and social history reviewed and updated as indicated. Interim medical history since our last visit reviewed. Allergies and medications reviewed and updated.  Review of Systems  Constitutional: Negative for activity change,  diaphoresis, fatigue and fever.  Respiratory: Positive for cough (intermittent, baseline), shortness of breath (intermittent, baseline) and wheezing (intermittent, baseline). Negative for chest tightness.   Cardiovascular: Negative for chest pain, palpitations and leg swelling.  Gastrointestinal: Negative for abdominal distention, abdominal pain, constipation, diarrhea, nausea and vomiting.  Endocrine: Negative for cold intolerance, heat intolerance, polydipsia, polyphagia and polyuria.  Neurological: Negative for dizziness, syncope, weakness, light-headedness, numbness and headaches.  Psychiatric/Behavioral: Negative.     Per HPI unless specifically indicated above     Objective:    BP (!) 130/55   Pulse 75   Temp 98.5 F (36.9 C) (Oral)   Ht _1  (1.753 m)   Wt 111 lb 12.8 oz (50.7 kg)   SpO2 96% Comment: with O2  BMI 16.51 kg/m   Wt Readings from Last 3 Encounters:  02/13/20 111 lb 12.8 oz (50.7 kg)  05/08/19 113 lb (51.3 kg)  01/15/19 113 lb (51.3 kg)    Physical Exam Vitals and nursing note reviewed.  Constitutional:      General: He is awake. He is not in acute distress.    Appearance: He is well-developed. He is cachectic. He is not ill-appearing.     Comments: Chronically ill appearing.  HENT:     Head: Normocephalic and atraumatic.     Right Ear: Hearing normal. No drainage.     Left Ear: Hearing normal. No drainage.  Eyes:     General: Lids are normal.        Right eye: No discharge.        Left eye: No discharge.     Conjunctiva/sclera:  Conjunctivae normal.     Pupils: Pupils are equal, round, and reactive to light.  Neck:     Vascular: No carotid bruit.  Cardiovascular:     Rate and Rhythm: Normal rate and regular rhythm.     Heart sounds: Normal heart sounds, S1 normal and S2 normal. No murmur heard.  No gallop.   Pulmonary:     Effort: No accessory muscle usage or respiratory distress.     Breath sounds: Decreased breath sounds and wheezing present.       Comments: Decreased breath sounds throughout with intermittent expiratory wheezes.  Clubbing of nails bilateral hands.  O2 by nasal cannula at 4L running. Abdominal:     General: Bowel sounds are normal.     Palpations: Abdomen is soft. There is no hepatomegaly or splenomegaly.  Musculoskeletal:        General: Normal range of motion.     Cervical back: Normal range of motion and neck supple.     Right lower leg: No edema.     Left lower leg: No edema.  Skin:    General: Skin is warm and dry.  Neurological:     Mental Status: He is alert and oriented to person, place, and time.  Psychiatric:        Attention and Perception: Attention normal.        Mood and Affect: Mood normal.        Behavior: Behavior normal. Behavior is cooperative.        Thought Content: Thought content normal.     Results for orders placed or performed in visit on 12/06/18  CBC with Differential/Platelet  Result Value Ref Range   WBC 8.8 3.4 - 10.8 x10E3/uL   RBC 3.94 (L) 4.14 - 5.80 x10E6/uL   Hemoglobin 12.6 (L) 13.0 - 17.7 g/dL   Hematocrit 37.2 (L) 37.5 - 51.0 %   MCV 94 79 - 97 fL   MCH 32.0 26.6 - 33.0 pg   MCHC 33.9 31 - 35 g/dL   RDW 12.8 11.6 - 15.4 %   Platelets 178 150 - 450 x10E3/uL   Neutrophils 67 Not Estab. %   Lymphs 21 Not Estab. %   Monocytes 9 Not Estab. %   Eos 1 Not Estab. %   Basos 1 Not Estab. %   Neutrophils Absolute 6.0 1.40 - 7.00 x10E3/uL   Lymphocytes Absolute 1.8 0 - 3 x10E3/uL   Monocytes Absolute 0.8 0 - 0 x10E3/uL   EOS (ABSOLUTE) 0.1 0.0 - 0.4 x10E3/uL   Basophils Absolute 0.1 0 - 0 x10E3/uL   Immature Granulocytes 1 Not Estab. %   Immature Grans (Abs) 0.0 0.0 - 0.1 x10E3/uL  Comprehensive metabolic panel  Result Value Ref Range   Glucose 98 65 - 99 mg/dL   BUN 16 8 - 27 mg/dL   Creatinine, Ser 0.97 0.76 - 1.27 mg/dL   GFR calc non Af Amer 76 >59 mL/min/1.73   GFR calc Af Amer 87 >59 mL/min/1.73   BUN/Creatinine Ratio 16 10 - 24   Sodium 143 134 - 144  mmol/L   Potassium 4.5 3.5 - 5.2 mmol/L   Chloride 97 96 - 106 mmol/L   CO2 36 (H) 20 - 29 mmol/L   Calcium 9.1 8.6 - 10.2 mg/dL   Total Protein 6.6 6.0 - 8.5 g/dL   Albumin 4.3 3.7 - 4.7 g/dL   Globulin, Total 2.3 1.5 - 4.5 g/dL   Albumin/Globulin Ratio 1.9 1.2 - 2.2   Bilirubin  Total 0.5 0.0 - 1.2 mg/dL   Alkaline Phosphatase 54 39 - 117 IU/L   AST 23 0 - 40 IU/L   ALT 22 0 - 44 IU/L  Lipid Panel w/o Chol/HDL Ratio  Result Value Ref Range   Cholesterol, Total 142 100 - 199 mg/dL   Triglycerides 91 0 - 149 mg/dL   HDL 72 >39 mg/dL   VLDL Cholesterol Cal 18 5 - 40 mg/dL   LDL Calculated 52 0 - 99 mg/dL      Assessment & Plan:   Problem List Items Addressed This Visit      Cardiovascular and Mediastinum   Essential hypertension    Chronic, ongoing.  Continue current medication regimen and adjust as needed, will change Lisinopril to Losartan which would be more appropriate with his underlying COPD.  Recommend he check his BP at least 3 times a week and document.  Return to office in 6 months and will obtain labs if patient agrees.      Relevant Medications   furosemide (LASIX) 20 MG tablet   amLODipine (NORVASC) 10 MG tablet   losartan (COZAAR) 25 MG tablet   Atherosclerosis of aorta (HCC)    Noted on CT scan March 2018, continue daily statin and ASA + recommend complete cessation of smoking.      Relevant Medications   furosemide (LASIX) 20 MG tablet   amLODipine (NORVASC) 10 MG tablet   losartan (COZAAR) 25 MG tablet   Pulmonary hypertension, primary (HCC)    Chronic, severe, dependent on continuous O2.  Continue to collaborate with CCM and palliative team.  Continue current medication regimen and collaboration with pulmonary.      Relevant Medications   furosemide (LASIX) 20 MG tablet   amLODipine (NORVASC) 10 MG tablet   losartan (COZAAR) 25 MG tablet   Cor pulmonale (HCC)    Chronic with recent worsening.  Refuses palliative services.  Continue collaboration  with cardiology team.  Return in 6 months or sooner if worsening.      Relevant Medications   furosemide (LASIX) 20 MG tablet   amLODipine (NORVASC) 10 MG tablet   losartan (COZAAR) 25 MG tablet     Respiratory   COPD, severe (HCC) - Primary    Chronic, ongoing. Continue medication regimen and collaboration with pulmonary at [facility].  He refuses palliative referral.  Return in 6 months or sooner if worsening.      Relevant Medications   formoterol (PERFOROMIST) 20 MCG/2ML nebulizer solution   revefenacin (YUPELRI) 175 MCG/3ML nebulizer solution     Nervous and Auditory   Nicotine dependence, cigarettes, w unsp disorders    I have recommended complete cessation of tobacco use. I have discussed various options available for assistance with tobacco cessation including over the counter methods (Nicotine gum, patch and lozenges). We also discussed prescription options (Chantix, Nicotine Inhaler / Nasal Spray). The patient is not interested in pursuing any prescription tobacco cessation options at this time.         Other   Hyperlipidemia    Chronic, ongoing.  Continue current medication regimen and adjust as needed.  Will obtain labs next visit if patient agreeable.         Relevant Medications   furosemide (LASIX) 20 MG tablet   amLODipine (NORVASC) 10 MG tablet   losartan (COZAAR) 25 MG tablet   Cachexia (HCC)    Recommend continuation of supplements, focus on placing them in freezer and taking as ice cream vs shake, which he may  prefer.          Follow up plan: Return in about 6 months (around 08/12/2020) for COPD, HTN/HLD.

## 2020-02-13 NOTE — Assessment & Plan Note (Signed)
Noted on CT scan March 2018, continue daily statin and ASA + recommend complete cessation of smoking.

## 2020-02-13 NOTE — Assessment & Plan Note (Signed)
Chronic, ongoing. Continue medication regimen and collaboration with pulmonary at [facility].  He refuses palliative referral.  Return in 6 months or sooner if worsening.

## 2020-02-13 NOTE — Assessment & Plan Note (Signed)
Chronic, ongoing.  Continue current medication regimen and adjust as needed, will change Lisinopril to Losartan which would be more appropriate with his underlying COPD.  Recommend he check his BP at least 3 times a week and document.  Return to office in 6 months and will obtain labs if patient agrees.

## 2020-02-13 NOTE — Assessment & Plan Note (Signed)
Chronic with recent worsening.  Refuses palliative services.  Continue collaboration with cardiology team.  Return in 6 months or sooner if worsening.

## 2020-02-20 DIAGNOSIS — J449 Chronic obstructive pulmonary disease, unspecified: Secondary | ICD-10-CM | POA: Diagnosis not present

## 2020-02-22 DIAGNOSIS — I5030 Unspecified diastolic (congestive) heart failure: Secondary | ICD-10-CM | POA: Diagnosis not present

## 2020-02-22 DIAGNOSIS — R6889 Other general symptoms and signs: Secondary | ICD-10-CM | POA: Diagnosis not present

## 2020-02-22 DIAGNOSIS — I10 Essential (primary) hypertension: Secondary | ICD-10-CM | POA: Diagnosis not present

## 2020-02-22 DIAGNOSIS — I2510 Atherosclerotic heart disease of native coronary artery without angina pectoris: Secondary | ICD-10-CM | POA: Diagnosis not present

## 2020-02-22 DIAGNOSIS — I2722 Pulmonary hypertension due to left heart disease: Secondary | ICD-10-CM | POA: Diagnosis not present

## 2020-02-22 DIAGNOSIS — J449 Chronic obstructive pulmonary disease, unspecified: Secondary | ICD-10-CM | POA: Diagnosis not present

## 2020-02-22 DIAGNOSIS — Z7951 Long term (current) use of inhaled steroids: Secondary | ICD-10-CM | POA: Diagnosis not present

## 2020-02-22 DIAGNOSIS — Z87891 Personal history of nicotine dependence: Secondary | ICD-10-CM | POA: Diagnosis not present

## 2020-02-22 DIAGNOSIS — J9621 Acute and chronic respiratory failure with hypoxia: Secondary | ICD-10-CM | POA: Diagnosis not present

## 2020-02-22 DIAGNOSIS — Z8673 Personal history of transient ischemic attack (TIA), and cerebral infarction without residual deficits: Secondary | ICD-10-CM | POA: Diagnosis not present

## 2020-02-22 DIAGNOSIS — Z79899 Other long term (current) drug therapy: Secondary | ICD-10-CM | POA: Diagnosis not present

## 2020-02-22 DIAGNOSIS — K5730 Diverticulosis of large intestine without perforation or abscess without bleeding: Secondary | ICD-10-CM | POA: Diagnosis not present

## 2020-02-22 DIAGNOSIS — Z66 Do not resuscitate: Secondary | ICD-10-CM | POA: Diagnosis not present

## 2020-02-22 DIAGNOSIS — Z515 Encounter for palliative care: Secondary | ICD-10-CM | POA: Diagnosis not present

## 2020-02-22 DIAGNOSIS — R001 Bradycardia, unspecified: Secondary | ICD-10-CM | POA: Diagnosis not present

## 2020-02-22 DIAGNOSIS — Z743 Need for continuous supervision: Secondary | ICD-10-CM | POA: Diagnosis not present

## 2020-02-22 DIAGNOSIS — I110 Hypertensive heart disease with heart failure: Secondary | ICD-10-CM | POA: Diagnosis not present

## 2020-02-22 DIAGNOSIS — I499 Cardiac arrhythmia, unspecified: Secondary | ICD-10-CM | POA: Diagnosis not present

## 2020-02-22 DIAGNOSIS — K5521 Angiodysplasia of colon with hemorrhage: Secondary | ICD-10-CM | POA: Diagnosis not present

## 2020-02-22 DIAGNOSIS — K5520 Angiodysplasia of colon without hemorrhage: Secondary | ICD-10-CM | POA: Diagnosis not present

## 2020-02-22 DIAGNOSIS — E872 Acidosis: Secondary | ICD-10-CM | POA: Diagnosis not present

## 2020-02-22 DIAGNOSIS — K644 Residual hemorrhoidal skin tags: Secondary | ICD-10-CM | POA: Diagnosis not present

## 2020-02-22 DIAGNOSIS — R918 Other nonspecific abnormal finding of lung field: Secondary | ICD-10-CM | POA: Diagnosis not present

## 2020-02-22 DIAGNOSIS — D649 Anemia, unspecified: Secondary | ICD-10-CM | POA: Diagnosis not present

## 2020-02-22 DIAGNOSIS — I272 Pulmonary hypertension, unspecified: Secondary | ICD-10-CM | POA: Diagnosis not present

## 2020-02-22 DIAGNOSIS — R0602 Shortness of breath: Secondary | ICD-10-CM | POA: Diagnosis not present

## 2020-02-22 DIAGNOSIS — Z720 Tobacco use: Secondary | ICD-10-CM | POA: Diagnosis not present

## 2020-02-22 DIAGNOSIS — K921 Melena: Secondary | ICD-10-CM | POA: Diagnosis not present

## 2020-02-22 DIAGNOSIS — K625 Hemorrhage of anus and rectum: Secondary | ICD-10-CM | POA: Diagnosis not present

## 2020-02-22 DIAGNOSIS — J9622 Acute and chronic respiratory failure with hypercapnia: Secondary | ICD-10-CM | POA: Diagnosis not present

## 2020-02-22 DIAGNOSIS — J439 Emphysema, unspecified: Secondary | ICD-10-CM | POA: Diagnosis not present

## 2020-02-22 DIAGNOSIS — K219 Gastro-esophageal reflux disease without esophagitis: Secondary | ICD-10-CM | POA: Diagnosis not present

## 2020-02-22 DIAGNOSIS — E785 Hyperlipidemia, unspecified: Secondary | ICD-10-CM | POA: Diagnosis not present

## 2020-02-22 DIAGNOSIS — R0600 Dyspnea, unspecified: Secondary | ICD-10-CM | POA: Diagnosis not present

## 2020-02-22 DIAGNOSIS — I5032 Chronic diastolic (congestive) heart failure: Secondary | ICD-10-CM | POA: Diagnosis not present

## 2020-02-22 DIAGNOSIS — K922 Gastrointestinal hemorrhage, unspecified: Secondary | ICD-10-CM | POA: Diagnosis not present

## 2020-02-22 DIAGNOSIS — I350 Nonrheumatic aortic (valve) stenosis: Secondary | ICD-10-CM | POA: Diagnosis not present

## 2020-02-22 DIAGNOSIS — F17210 Nicotine dependence, cigarettes, uncomplicated: Secondary | ICD-10-CM | POA: Diagnosis not present

## 2020-02-22 DIAGNOSIS — R231 Pallor: Secondary | ICD-10-CM | POA: Diagnosis not present

## 2020-02-22 DIAGNOSIS — D539 Nutritional anemia, unspecified: Secondary | ICD-10-CM | POA: Diagnosis not present

## 2020-02-22 DIAGNOSIS — Z7982 Long term (current) use of aspirin: Secondary | ICD-10-CM | POA: Diagnosis not present

## 2020-02-22 DIAGNOSIS — D62 Acute posthemorrhagic anemia: Secondary | ICD-10-CM | POA: Diagnosis not present

## 2020-02-26 ENCOUNTER — Telehealth: Payer: Self-pay

## 2020-02-26 NOTE — Telephone Encounter (Signed)
Transition Care Management Follow-up Telephone Call  Date of discharge and from where: 02/25/2020 [facility]  How have you been since you were released from the hospital? ok  Any questions or concerns? No  Items Reviewed:  Did the pt receive and understand the discharge instructions provided? Yes   Medications obtained and verified? Yes   Other? No   Any new allergies since your discharge? No   Dietary orders reviewed? Yes  Do you have support at home? Yes   Home Care and Equipment/Supplies: Were home health services ordered? not applicable If so, what is the name of the agency? n/a  Has the agency set up a time to come to the patient's home? not applicable Were any new equipment or medical supplies ordered?  No What is the name of the medical supply agency? n/a Were you able to get the supplies/equipment? not applicable Do you have any questions related to the use of the equipment or supplies? No  Functional Questionnaire: (I = Independent and D = Dependent) ADLs: I needs help sometimes  Bathing/Dressing- I needs help sometimes  Meal Prep- I  Eating- I  Maintaining continence- I  Transferring/Ambulation- I  Managing Meds- I  Follow up appointments reviewed:   PCP Hospital f/u appt confirmed? Yes  Scheduled to see Dr. Wynetta Emery on 03/08/2020 @ 3:00p.  Are transportation arrangements needed? No   If their condition worsens, is the pt aware to call PCP or go to the Emergency Dept.? Yes  Was the patient provided with contact information for the PCP's office or ED? Yes  Was to pt encouraged to call back with questions or concerns? Yes

## 2020-03-08 ENCOUNTER — Inpatient Hospital Stay: Payer: Medicare Other | Admitting: Family Medicine

## 2020-03-09 DIAGNOSIS — R918 Other nonspecific abnormal finding of lung field: Secondary | ICD-10-CM | POA: Diagnosis not present

## 2020-03-09 DIAGNOSIS — I272 Pulmonary hypertension, unspecified: Secondary | ICD-10-CM | POA: Diagnosis not present

## 2020-03-09 DIAGNOSIS — E785 Hyperlipidemia, unspecified: Secondary | ICD-10-CM | POA: Diagnosis not present

## 2020-03-09 DIAGNOSIS — Z681 Body mass index (BMI) 19 or less, adult: Secondary | ICD-10-CM | POA: Diagnosis not present

## 2020-03-09 DIAGNOSIS — R64 Cachexia: Secondary | ICD-10-CM | POA: Diagnosis not present

## 2020-03-09 DIAGNOSIS — J441 Chronic obstructive pulmonary disease with (acute) exacerbation: Secondary | ICD-10-CM | POA: Diagnosis not present

## 2020-03-09 DIAGNOSIS — I959 Hypotension, unspecified: Secondary | ICD-10-CM | POA: Diagnosis not present

## 2020-03-09 DIAGNOSIS — R4020 Unspecified coma: Secondary | ICD-10-CM | POA: Diagnosis not present

## 2020-03-09 DIAGNOSIS — K219 Gastro-esophageal reflux disease without esophagitis: Secondary | ICD-10-CM | POA: Diagnosis not present

## 2020-03-09 DIAGNOSIS — D649 Anemia, unspecified: Secondary | ICD-10-CM | POA: Diagnosis not present

## 2020-03-09 DIAGNOSIS — I499 Cardiac arrhythmia, unspecified: Secondary | ICD-10-CM | POA: Diagnosis not present

## 2020-03-09 DIAGNOSIS — R6889 Other general symptoms and signs: Secondary | ICD-10-CM | POA: Diagnosis not present

## 2020-03-09 DIAGNOSIS — Z8673 Personal history of transient ischemic attack (TIA), and cerebral infarction without residual deficits: Secondary | ICD-10-CM | POA: Diagnosis not present

## 2020-03-09 DIAGNOSIS — R0902 Hypoxemia: Secondary | ICD-10-CM | POA: Diagnosis not present

## 2020-03-09 DIAGNOSIS — R0602 Shortness of breath: Secondary | ICD-10-CM | POA: Diagnosis not present

## 2020-03-09 DIAGNOSIS — Z66 Do not resuscitate: Secondary | ICD-10-CM | POA: Diagnosis not present

## 2020-03-09 DIAGNOSIS — Z743 Need for continuous supervision: Secondary | ICD-10-CM | POA: Diagnosis not present

## 2020-03-09 DIAGNOSIS — Z7952 Long term (current) use of systemic steroids: Secondary | ICD-10-CM | POA: Diagnosis not present

## 2020-03-09 DIAGNOSIS — E872 Acidosis: Secondary | ICD-10-CM | POA: Diagnosis not present

## 2020-03-09 DIAGNOSIS — I110 Hypertensive heart disease with heart failure: Secondary | ICD-10-CM | POA: Diagnosis not present

## 2020-03-09 DIAGNOSIS — E440 Moderate protein-calorie malnutrition: Secondary | ICD-10-CM | POA: Diagnosis not present

## 2020-03-09 DIAGNOSIS — F17210 Nicotine dependence, cigarettes, uncomplicated: Secondary | ICD-10-CM | POA: Diagnosis not present

## 2020-03-09 DIAGNOSIS — J449 Chronic obstructive pulmonary disease, unspecified: Secondary | ICD-10-CM | POA: Diagnosis not present

## 2020-03-09 DIAGNOSIS — J9622 Acute and chronic respiratory failure with hypercapnia: Secondary | ICD-10-CM | POA: Diagnosis not present

## 2020-03-09 DIAGNOSIS — I5032 Chronic diastolic (congestive) heart failure: Secondary | ICD-10-CM | POA: Diagnosis not present

## 2020-03-09 DIAGNOSIS — J9621 Acute and chronic respiratory failure with hypoxia: Secondary | ICD-10-CM | POA: Diagnosis not present

## 2020-03-09 DIAGNOSIS — I350 Nonrheumatic aortic (valve) stenosis: Secondary | ICD-10-CM | POA: Diagnosis not present

## 2020-03-25 DIAGNOSIS — J449 Chronic obstructive pulmonary disease, unspecified: Secondary | ICD-10-CM | POA: Diagnosis not present

## 2020-03-25 DIAGNOSIS — J9622 Acute and chronic respiratory failure with hypercapnia: Secondary | ICD-10-CM | POA: Diagnosis not present

## 2020-03-30 ENCOUNTER — Telehealth: Payer: Self-pay | Admitting: Primary Care

## 2020-03-30 NOTE — Telephone Encounter (Signed)
T/c to Mrs Alwin to schedule home visit to see patient. No answer, message left to return call and schedule appt.

## 2020-04-06 DIAGNOSIS — R918 Other nonspecific abnormal finding of lung field: Secondary | ICD-10-CM | POA: Diagnosis not present

## 2020-04-06 DIAGNOSIS — K219 Gastro-esophageal reflux disease without esophagitis: Secondary | ICD-10-CM | POA: Diagnosis not present

## 2020-04-06 DIAGNOSIS — D696 Thrombocytopenia, unspecified: Secondary | ICD-10-CM | POA: Diagnosis not present

## 2020-04-06 DIAGNOSIS — I110 Hypertensive heart disease with heart failure: Secondary | ICD-10-CM | POA: Diagnosis not present

## 2020-04-06 DIAGNOSIS — I5032 Chronic diastolic (congestive) heart failure: Secondary | ICD-10-CM | POA: Diagnosis not present

## 2020-04-06 DIAGNOSIS — Z681 Body mass index (BMI) 19 or less, adult: Secondary | ICD-10-CM | POA: Diagnosis not present

## 2020-04-06 DIAGNOSIS — J9692 Respiratory failure, unspecified with hypercapnia: Secondary | ICD-10-CM | POA: Diagnosis not present

## 2020-04-06 DIAGNOSIS — I10 Essential (primary) hypertension: Secondary | ICD-10-CM | POA: Diagnosis not present

## 2020-04-06 DIAGNOSIS — J9691 Respiratory failure, unspecified with hypoxia: Secondary | ICD-10-CM | POA: Diagnosis not present

## 2020-04-06 DIAGNOSIS — I272 Pulmonary hypertension, unspecified: Secondary | ICD-10-CM | POA: Diagnosis not present

## 2020-04-06 DIAGNOSIS — I959 Hypotension, unspecified: Secondary | ICD-10-CM | POA: Diagnosis not present

## 2020-04-06 DIAGNOSIS — E440 Moderate protein-calorie malnutrition: Secondary | ICD-10-CM | POA: Diagnosis not present

## 2020-04-06 DIAGNOSIS — J449 Chronic obstructive pulmonary disease, unspecified: Secondary | ICD-10-CM | POA: Diagnosis not present

## 2020-04-06 DIAGNOSIS — J441 Chronic obstructive pulmonary disease with (acute) exacerbation: Secondary | ICD-10-CM | POA: Diagnosis not present

## 2020-04-06 DIAGNOSIS — D649 Anemia, unspecified: Secondary | ICD-10-CM | POA: Diagnosis not present

## 2020-04-06 DIAGNOSIS — R54 Age-related physical debility: Secondary | ICD-10-CM | POA: Diagnosis not present

## 2020-04-06 DIAGNOSIS — J9621 Acute and chronic respiratory failure with hypoxia: Secondary | ICD-10-CM | POA: Diagnosis not present

## 2020-04-06 DIAGNOSIS — F17210 Nicotine dependence, cigarettes, uncomplicated: Secondary | ICD-10-CM | POA: Diagnosis not present

## 2020-04-06 DIAGNOSIS — R0600 Dyspnea, unspecified: Secondary | ICD-10-CM | POA: Diagnosis not present

## 2020-04-06 DIAGNOSIS — J439 Emphysema, unspecified: Secondary | ICD-10-CM | POA: Diagnosis not present

## 2020-04-06 DIAGNOSIS — J9622 Acute and chronic respiratory failure with hypercapnia: Secondary | ICD-10-CM | POA: Diagnosis not present

## 2020-04-06 DIAGNOSIS — J431 Panlobular emphysema: Secondary | ICD-10-CM | POA: Diagnosis not present

## 2020-04-06 DIAGNOSIS — K5900 Constipation, unspecified: Secondary | ICD-10-CM | POA: Diagnosis not present

## 2020-04-06 DIAGNOSIS — I2722 Pulmonary hypertension due to left heart disease: Secondary | ICD-10-CM | POA: Diagnosis not present

## 2020-04-06 DIAGNOSIS — J9602 Acute respiratory failure with hypercapnia: Secondary | ICD-10-CM | POA: Diagnosis not present

## 2020-04-06 DIAGNOSIS — E785 Hyperlipidemia, unspecified: Secondary | ICD-10-CM | POA: Diagnosis not present

## 2020-04-06 DIAGNOSIS — G9340 Encephalopathy, unspecified: Secondary | ICD-10-CM | POA: Diagnosis not present

## 2020-04-06 DIAGNOSIS — I350 Nonrheumatic aortic (valve) stenosis: Secondary | ICD-10-CM | POA: Diagnosis not present

## 2020-04-06 DIAGNOSIS — Z515 Encounter for palliative care: Secondary | ICD-10-CM | POA: Diagnosis not present

## 2020-04-12 ENCOUNTER — Other Ambulatory Visit: Payer: Self-pay | Admitting: Nurse Practitioner

## 2020-05-04 ENCOUNTER — Telehealth: Payer: Self-pay | Admitting: Family Medicine

## 2020-05-04 NOTE — Telephone Encounter (Signed)
Copied from [place] [id number]. Topic: Medicare AWV >> May 04, 2020  2:00 PM Weston Anna wrote: Reason for CRM:  No answer unable to leave a message: AWVS scheduled Feb 2 ,2022 has been cancelled and rescheduled to be done by telephone (not in office) on May 10, 2020 at 2:30 pm.

## 2020-05-10 ENCOUNTER — Telehealth: Payer: Self-pay

## 2020-05-10 ENCOUNTER — Ambulatory Visit: Payer: Medicare Other

## 2020-05-10 NOTE — Telephone Encounter (Signed)
This nurse attempted to call patient three times for scheduled telephonic AWV. At 1425, 1430, and 1440. Mailbox is full and unable to leave message

## 2020-05-12 ENCOUNTER — Other Ambulatory Visit: Payer: Self-pay | Admitting: Nurse Practitioner

## 2020-05-12 ENCOUNTER — Ambulatory Visit: Payer: Medicare HMO

## 2020-05-13 ENCOUNTER — Other Ambulatory Visit: Payer: Self-pay | Admitting: Nurse Practitioner

## 2020-05-13 NOTE — Telephone Encounter (Signed)
  Notes to clinic Refused yesterday due to not on pt current med list and it has been re-requested. Former Dr Jeananne Rama pt, has seen Cooter, NP

## 2020-05-14 DIAGNOSIS — J9601 Acute respiratory failure with hypoxia: Secondary | ICD-10-CM | POA: Diagnosis not present

## 2020-05-14 DIAGNOSIS — J9602 Acute respiratory failure with hypercapnia: Secondary | ICD-10-CM | POA: Diagnosis not present

## 2020-05-14 DIAGNOSIS — Z66 Do not resuscitate: Secondary | ICD-10-CM | POA: Diagnosis not present

## 2020-05-14 DIAGNOSIS — Z743 Need for continuous supervision: Secondary | ICD-10-CM | POA: Diagnosis not present

## 2020-05-14 DIAGNOSIS — J449 Chronic obstructive pulmonary disease, unspecified: Secondary | ICD-10-CM | POA: Diagnosis not present

## 2020-05-14 DIAGNOSIS — R6889 Other general symptoms and signs: Secondary | ICD-10-CM | POA: Diagnosis not present

## 2020-05-14 DIAGNOSIS — I110 Hypertensive heart disease with heart failure: Secondary | ICD-10-CM | POA: Diagnosis not present

## 2020-05-14 DIAGNOSIS — I499 Cardiac arrhythmia, unspecified: Secondary | ICD-10-CM | POA: Diagnosis not present

## 2020-05-14 DIAGNOSIS — K219 Gastro-esophageal reflux disease without esophagitis: Secondary | ICD-10-CM | POA: Diagnosis not present

## 2020-05-14 DIAGNOSIS — J811 Chronic pulmonary edema: Secondary | ICD-10-CM | POA: Diagnosis not present

## 2020-05-14 DIAGNOSIS — Z515 Encounter for palliative care: Secondary | ICD-10-CM | POA: Diagnosis not present

## 2020-05-14 DIAGNOSIS — J441 Chronic obstructive pulmonary disease with (acute) exacerbation: Secondary | ICD-10-CM | POA: Diagnosis not present

## 2020-05-14 DIAGNOSIS — R069 Unspecified abnormalities of breathing: Secondary | ICD-10-CM | POA: Diagnosis not present

## 2020-05-14 DIAGNOSIS — F17210 Nicotine dependence, cigarettes, uncomplicated: Secondary | ICD-10-CM | POA: Diagnosis not present

## 2020-05-14 DIAGNOSIS — Z79899 Other long term (current) drug therapy: Secondary | ICD-10-CM | POA: Diagnosis not present

## 2020-05-14 DIAGNOSIS — J439 Emphysema, unspecified: Secondary | ICD-10-CM | POA: Diagnosis not present

## 2020-05-14 DIAGNOSIS — J9692 Respiratory failure, unspecified with hypercapnia: Secondary | ICD-10-CM | POA: Diagnosis not present

## 2020-05-14 DIAGNOSIS — R0602 Shortness of breath: Secondary | ICD-10-CM | POA: Diagnosis not present

## 2020-05-14 DIAGNOSIS — Z20822 Contact with and (suspected) exposure to covid-19: Secondary | ICD-10-CM | POA: Diagnosis not present

## 2020-05-14 DIAGNOSIS — I5030 Unspecified diastolic (congestive) heart failure: Secondary | ICD-10-CM | POA: Diagnosis not present

## 2020-05-18 ENCOUNTER — Other Ambulatory Visit: Payer: Self-pay | Admitting: Nurse Practitioner

## 2020-05-18 ENCOUNTER — Telehealth: Payer: Self-pay

## 2020-05-18 NOTE — Telephone Encounter (Signed)
I would be glad to fill these out, but have not met son or seen patient since November -- can we work on doing virtual visit with patient at home and son & wife present.  I believe wife is on DPR list and maybe we can get son added there as well, since he is assisting with care.  Thanks.

## 2020-05-18 NOTE — Telephone Encounter (Signed)
Pt's son not on DPR no information given came to drop off FMLA forms in regards to pt due to him providing care for Patrick Ayala our pt  as he would like FMLA forms for his job to be singed if possible since pt is in hospice and son is trying to take care of pt. Pt's son Patrick Ayala can be reached at [phone].

## 2020-05-18 NOTE — Telephone Encounter (Signed)
Placed in your folder for review.

## 2020-05-19 ENCOUNTER — Telehealth: Payer: Self-pay | Admitting: Family Medicine

## 2020-05-19 ENCOUNTER — Telehealth: Payer: Self-pay

## 2020-05-19 NOTE — Telephone Encounter (Signed)
Copied from [place] [id number]. Topic: Medicare AWV >> May 19, 2020  3:14 PM Cher Nakai R wrote: Reason for CRM:   No answer unable to leave a message for patient to call back and schedule the Medicare Annual Wellness Visit (AWV) virtually or by telephone.  Last AWV  05/08/2019  Please schedule at anytime with [facility]-Nurse Health Advisor.  45 minute appointment  Any questions, please call me at [phone]

## 2020-05-19 NOTE — Telephone Encounter (Signed)
See other encounter tired both son and pts number both VM full.

## 2020-05-19 NOTE — Telephone Encounter (Signed)
Unable to lvm on VM was full.

## 2020-05-19 NOTE — Telephone Encounter (Signed)
Paperwork for FMLA needs an appointment to be completed by Jolene. See other phone encounter. Jolene requests a virtual visit with the patient and family to complete. Paperwork in the incomplete bin until appointment.

## 2020-05-19 NOTE — Telephone Encounter (Signed)
Please see Jolene's message.

## 2020-05-20 NOTE — Telephone Encounter (Signed)
Lvm with to get scheduled in order to sign forms for son.

## 2020-05-20 NOTE — Telephone Encounter (Signed)
Pt scheduled for 05/28/2020 Pt's wife verbalized understand

## 2020-05-28 ENCOUNTER — Telehealth: Payer: Medicare Other | Admitting: Nurse Practitioner

## 2020-05-30 IMAGING — DX DG CHEST 1V PORT
1 series · 1 of 1 positions shown · non-contrast
Comparison: Chest x-rays dated 08/02/2016 and 01/30/2016.

CLINICAL DATA: Weakness and shortness of breath onset today.
Recently treated for pneumonia.

EXAM:
PORTABLE CHEST 1 VIEW

[chest ap]
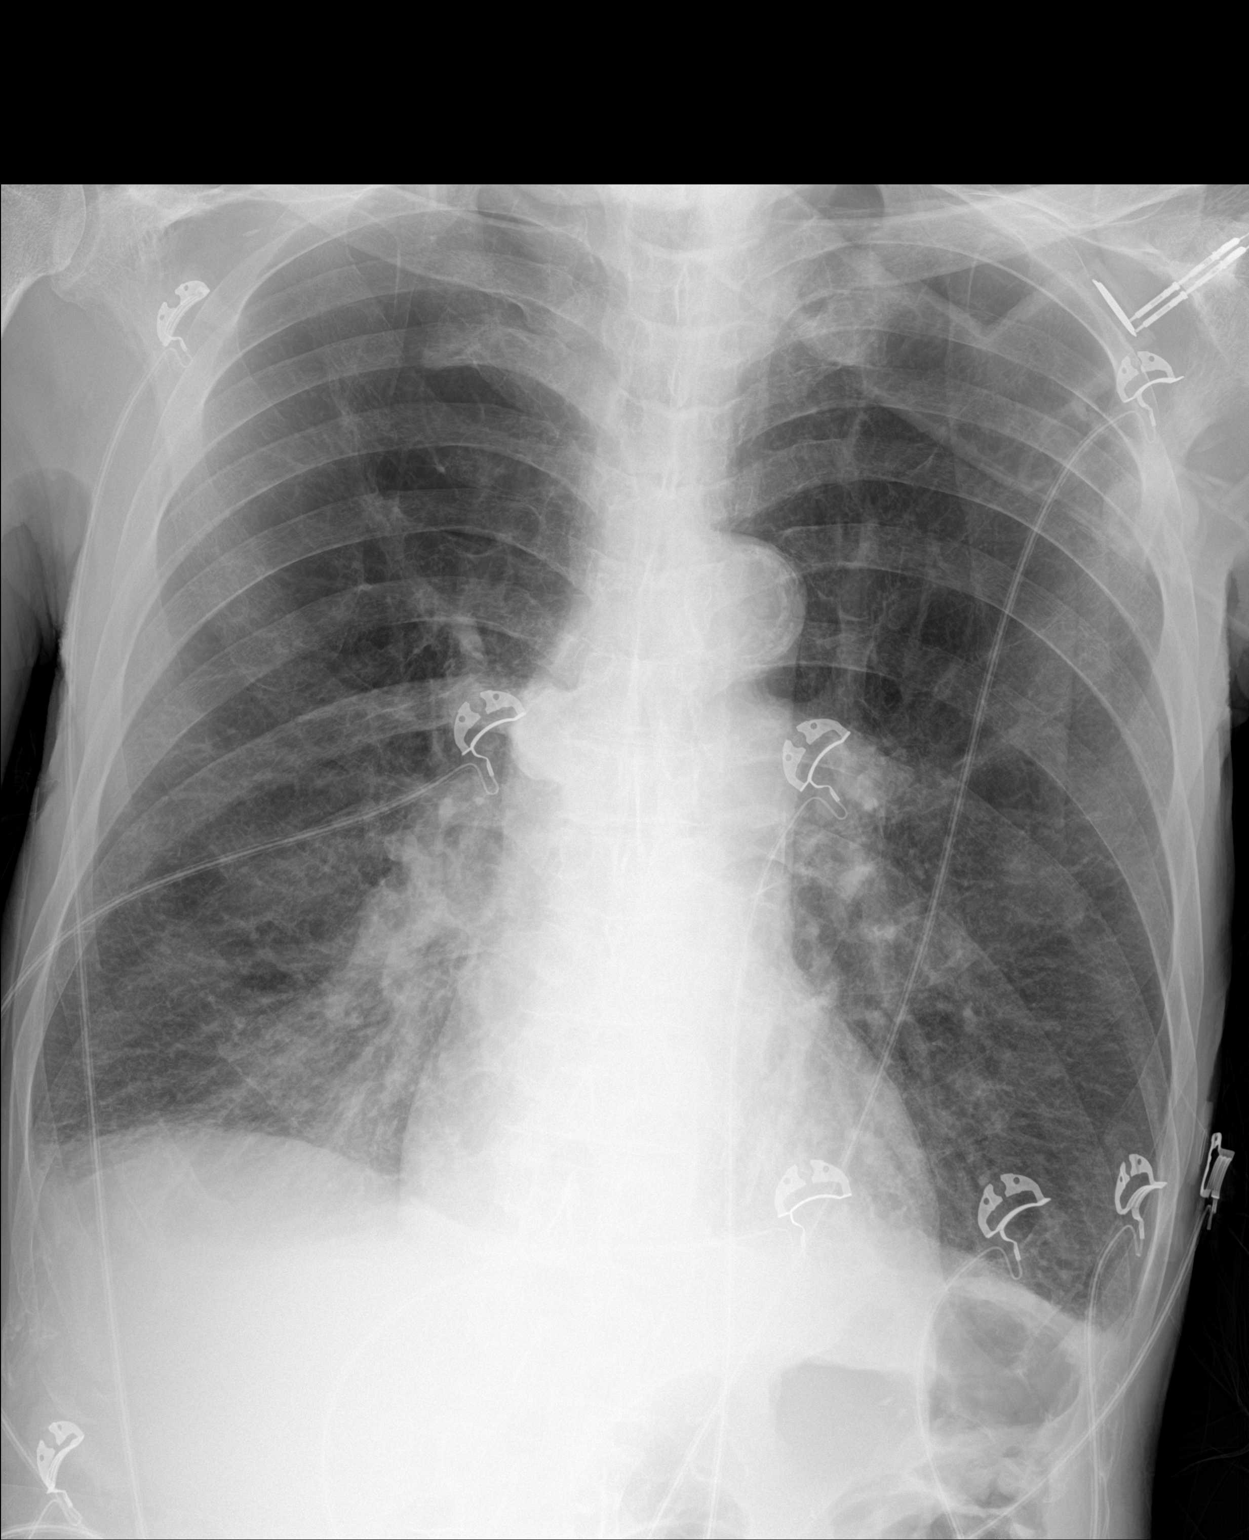

[1 of 1 positions shown; findings below may reference images not displayed]

FINDINGS: Ill-defined opacity at the medial aspects of the RIGHT lung base,
likely pneumonia. Lungs are hyperexpanded, with associated
emphysematous changes in the upper lobes. Heart size and mediastinal
contours are stable. Aortic atherosclerosis. No acute or suspicious
osseous finding.
IMPRESSION: 1. New ill-defined opacity at the medial aspects of the RIGHT lung
base, presumed pneumonia.
2. COPD/emphysema.
3. Aortic atherosclerosis.

## 2020-06-08 DEATH — deceased

## 2020-07-08 ENCOUNTER — Telehealth: Payer: Self-pay | Admitting: Internal Medicine

## 2020-07-08 NOTE — Telephone Encounter (Signed)
Copied from [place] [id number]. Topic: Medicare AWV >> Jul 08, 2020  3:27 PM Cher Nakai R wrote: Reason for CRM:   No answer unable to leave a message for patient to call back and schedule the Medicare Annual Wellness Visit (AWV) virtually or by telephone.  Last AWV 05/08/2019  Please schedule at anytime with [facility]-Nurse Health Advisor.  45 minute appointment  Any questions, please call me at [phone]

## 2020-08-13 ENCOUNTER — Ambulatory Visit: Payer: Self-pay | Admitting: Nurse Practitioner

## 2020-08-16 ENCOUNTER — Telehealth: Payer: Self-pay

## 2020-08-16 NOTE — Telephone Encounter (Signed)
Spoke with wife let her know message below.

## 2020-08-16 NOTE — Telephone Encounter (Signed)
Noted, thank you.  Please alert her we appreciate this update and are very sorry for her loss.  If she needs anything please let us know.  We will update his chart.

## 2020-08-16 NOTE — Telephone Encounter (Signed)
Copied from [place] [id number]. Topic: Quick Communication - See Telephone Encounter >> Aug 16, 2020 11:20 AM [place] wrote: CRM for notification. See Telephone encounter for: 08/16/20. Pt wife called and has been getting appt calls for her husband and states he passed away in 2020-05-25

## 2020-08-16 NOTE — Telephone Encounter (Signed)
-----  Message from Venita Lick, NP sent at 08/13/2020  4:37 PM EDT ----- Has no showed 6 visits over past months -- looks like [facility] hospice is involved right now.  Last saw him in February --  can we reach out to wife and see if all is okay please.

## 2020-08-16 NOTE — Telephone Encounter (Signed)
Lvm we message below from PCP

## 2021-05-13 ENCOUNTER — Ambulatory Visit: Payer: Medicare Other
